# Patient Record
Sex: Male | Born: 1960 | Hispanic: Yes | Marital: Single | Smoking: Never smoker
Health system: Southern US, Community
[De-identification: ages and names within clinical notes are randomized; demographics above are authoritative.]

## PROBLEM LIST (undated history)

## (undated) DIAGNOSIS — G8929 Other chronic pain: Secondary | ICD-10-CM

## (undated) DIAGNOSIS — M549 Dorsalgia, unspecified: Secondary | ICD-10-CM

## (undated) DIAGNOSIS — E119 Type 2 diabetes mellitus without complications: Secondary | ICD-10-CM

## (undated) DIAGNOSIS — I10 Essential (primary) hypertension: Secondary | ICD-10-CM

## (undated) DIAGNOSIS — M5416 Radiculopathy, lumbar region: Secondary | ICD-10-CM

## (undated) HISTORY — PX: KNEE SURGERY: SHX244

---

## 2004-07-29 ENCOUNTER — Emergency Department: Payer: Self-pay | Admitting: Emergency Medicine

## 2004-10-08 ENCOUNTER — Emergency Department: Payer: Self-pay | Admitting: Emergency Medicine

## 2004-10-10 ENCOUNTER — Emergency Department: Payer: Self-pay | Admitting: Emergency Medicine

## 2006-10-04 ENCOUNTER — Emergency Department: Payer: Self-pay | Admitting: Emergency Medicine

## 2006-10-04 ENCOUNTER — Other Ambulatory Visit: Payer: Self-pay

## 2007-08-14 ENCOUNTER — Emergency Department: Payer: Self-pay | Admitting: Emergency Medicine

## 2008-04-01 ENCOUNTER — Emergency Department: Payer: Self-pay | Admitting: Internal Medicine

## 2008-04-13 ENCOUNTER — Emergency Department (HOSPITAL_COMMUNITY): Admission: EM | Admit: 2008-04-13 | Discharge: 2008-04-13 | Payer: Self-pay | Admitting: Emergency Medicine

## 2008-04-20 ENCOUNTER — Ambulatory Visit (HOSPITAL_COMMUNITY): Admission: RE | Admit: 2008-04-20 | Discharge: 2008-04-20 | Payer: Self-pay | Admitting: Orthopaedic Surgery

## 2008-08-02 ENCOUNTER — Ambulatory Visit (HOSPITAL_COMMUNITY): Admission: RE | Admit: 2008-08-02 | Discharge: 2008-08-02 | Payer: Self-pay | Admitting: Orthopaedic Surgery

## 2009-06-02 ENCOUNTER — Observation Stay: Payer: Self-pay | Admitting: Internal Medicine

## 2010-08-23 LAB — COMPREHENSIVE METABOLIC PANEL
AST: 44 U/L — ABNORMAL HIGH (ref 0–37)
Albumin: 3.9 g/dL (ref 3.5–5.2)
BUN: 14 mg/dL (ref 6–23)
Chloride: 101 mEq/L (ref 96–112)
Creatinine, Ser: 0.62 mg/dL (ref 0.4–1.5)
GFR calc Af Amer: >60 mL/min (ref >60)
Potassium: 4 mEq/L (ref 3.5–5.1)
Total Bilirubin: 0.9 mg/dL (ref 0.3–1.2)
Total Protein: 6.6 g/dL (ref 6.0–8.3)

## 2010-08-23 LAB — CBC
HCT: 42.8 % (ref 39.0–52.0)
MCV: 94.4 fL (ref 78.0–100.0)
Platelets: 173 10*3/uL (ref 150–400)
RDW: 13.1 % (ref 11.5–15.5)
WBC: 8.4 10*3/uL (ref 4.0–10.5)

## 2010-08-23 LAB — URINALYSIS, ROUTINE W REFLEX MICROSCOPIC
Glucose, UA: NEGATIVE mg/dL
Hgb urine dipstick: NEGATIVE
Specific Gravity, Urine: >1.03 — ABNORMAL HIGH (ref 1.005–1.030)
pH: 5.5 (ref 5.0–8.0)

## 2010-08-23 LAB — DIFFERENTIAL
Eosinophils Relative: 5 % (ref 0–5)
Lymphocytes Relative: 38 % (ref 12–46)
Monocytes Absolute: 0.6 10*3/uL (ref 0.1–1.0)
Monocytes Relative: 8 % (ref 3–12)
Neutro Abs: 4.1 10*3/uL (ref 1.7–7.7)

## 2010-09-25 NOTE — H&P (Signed)
Joseph Raymond Joseph Raymond Raymond, Joseph Raymond Joseph Raymond Raymond         ACCOUNT NO.:  [id number]   MEDICAL RECORD NO.:  [id number]          PATIENT TYPE:  AMB   LOCATION:  DAY                           FACILITY:  [facility]   PHYSICIAN:  J. Darreld Mclean, M.D. DATE OF BIRTH:  [birth year]   DATE OF ADMISSION:  DATE OF DISCHARGE:  LH                              HISTORY & PHYSICAL   The patient is scheduled for surgery on Tuesday.   CHIEF COMPLAINT:  My left knee hurts.   I first saw the patient who is 50 years old on April 14, 2008, in the  office.  He had a recent injury of his arm on April 01, 2008.  He was  seen at [facility].  X-rays taken were negative.  He was given ibuprofen.  He continued pain, giving away of his knee.  He  did not improve.  He is taking Naprosyn and oxycodone.  His knee was not  getting any better.  I was concerned about a possible meniscal tear.  I  injected his knee with Marcaine, Xylocaine, and 1 mL a Depo-Medrol.  I  kept him on his Naprosyn.  I scheduled for MRI of his left knee.  MRI of  the left knee was done on April 20, 2008.  It showed a tear of the  posterior horn of the medial meniscus with centrally and peripherally  placed meniscal fragment.  He had some medial and collateral ligament  changes and changes of chondromalacia and patella.  I informed him of  the findings on April 22, 2008, but his knee was doing better and he  did not want to have any surgery.  He wanted to wait until up to  holidays.  I saw him back in June 13, 2008, and he had a new large  effusion with range of motion decreased giving away.  I aspirated 35 mL  of blood from his knee.  I told him about outpatient surgery.  He said  he wanted to think about it.  He returned the office on July 29, 2008,  and stated he would like to have surgery this coming week.   I discussed with him the risks and imponderables of the procedure.  He  appears to understand.  He asked appropriate  questions.  I told him it  is an elective procedure and he did not have to have the surgery.  He  can wait.  He says he waited long enough.  He understands with meniscus  being removed over time, he can have some degenerative changes and may  need further surgery.   PAST HISTORY:  Negative.  Denies heart disease.  He denies lung disease,  kidney disease, stroke, paralysis, weakness, hypertension, diabetes,  cancer, ulcer disease, or circulatory problems.   He does not smoke, but he does use alcoholic beverages socially.   No allergies.   He is currently taking Naprosyn 500 mg p.o. b.i.d. and Vicodin ES q.4  p.r.n. pain.   He has had no previous surgery.   He has no family doctor.   He lives in [place] in [place] and he  is single.   PHYSICAL EXAMINATION:  VITAL SIGNS:  BP is 150/99, respirations 16, he  is afebrile, 5 feet 4-1/2 inches tall and weighs 249 pounds.  GENERAL:  He is alert, cooperative, and oriented.  HEENT:  Negative.  NECK:  Supple.  LUNGS:  Clear to P&A.  HEART:  Regular rate and rhythm without murmur heard.  ABDOMEN:  Soft, nontender without masses.  EXTREMITIES:  He has got some slight effusion in the left knee.  Range  of motion is 0 to 110 degrees.  Tender medially with a positive Murray  medially.  He has no distal edema.  Other extremities are negative.  CNS:  Intact.  SKIN:  Intact.   IMPRESSION:  Tear of the medial meniscus of the left knee.   PLAN:  Arthroscopy of the left knee as an outpatient procedure.  Labs  are pending.  Risks and imponderables have been discussed with the  patient.                                            ______________________________  J. Darreld Mclean, M.D.     JWK/MEDQ  D:  07/31/2008  T:  08/01/2008  Job:  [id number]

## 2010-09-25 NOTE — Op Note (Signed)
NAMEMADSEN, RIDDLE         ACCOUNT NO.:  [id number]   MEDICAL RECORD NO.:  [id number]          PATIENT TYPE:  AMB   LOCATION:  DAY                           FACILITY:  [facility]   PHYSICIAN:  J. Darreld Mclean, M.D. DATE OF BIRTH:  01-Apr-1961   DATE OF PROCEDURE:  08/02/2008  DATE OF DISCHARGE:                               OPERATIVE REPORT   PREOPERATIVE DIAGNOSIS:  Tear in the left knee medial meniscus.   POSTOPERATIVE DIAGNOSIS:  Tear in the left knee medial meniscus.   PROCEDURES:  1. Arthroscopy of the left knee.  2. Partial medial meniscectomy.   ANESTHESIA:  General.   TOURNIQUET TIME:  18 minutes.   DRAINS:  No drains.   SURGEON:  J. Darreld Mclean, MD   INDICATIONS:  The patient is a 50 year old male who has had pain and  tenderness in his left knee.  MRI shows a tear of the posterior horn of  the medial meniscus.  The patient originally delayed surgery because his  knee got better, but has gotten worse with recurring effusions, pain  giving way.  One over the risks and imponderables of an outpatient  procedure for the arthroscopy of the knee, he appeared to understand and  agrees to have it done.   Physical therapy has been arranged postoperatively.  The patient was  seen in the holding area.  The left knee was identified as the correct  surgical site.  He placed a mark on the left knee.  I placed a mark on  the left knee.  He was brought back to the operating room.  He was  placed supine on the operating room table and given general anesthesia.  Leg holder and tourniquet were placed, deflated in the left upper thigh.  He was prepped and draped in the usual manner.   A generalized time-out identifying the patient as Mr. Mccadden.  The  left knee as the correct surgical site.  All instrumentations were  seemed to be properly positioned and working.  Operative team knew each  other.  The leg was then elevated, wrapped circumferentially with  Esmarch bandage.   Tourniquet inflated to 300 mmHg.  Esmarch bandage was  removed.  Inflow cannula was inserted medially.  Lactated Ringers  instilled to the knee by an infusion pump.  Arthroscope inserted  laterally and the knee was systematically examined.   Suprapatellar pouch had mild synovitis.  Insertion patella some early  changes more medially, grade II.  The medial side of the knee,  there  was a tear in the posterior horn of the medial meniscus with some grade  II to grade III changes in the femoral condyle and the tibial plateau.  This was at the location of the tear.  Anterior cruciate was intact  laterally.  Then, meniscus looked very good with only minimal changes  and grade II changes.  No loose bodies were present in the knee.   Attention was directed back to the medial side and using meniscal punch  and meniscal shaver, good smooth contour was obtained after moving the  tear.  Specimens sent to Pathology.  Knee was  systematically reexamined  and no pathology found.  Prominent pictures were taken throughout the  case.  Knee was irrigated with remaining part of lactated Ringers.  A 3-  0 nylon interrupted vertical mattress manner was used.  Each portal was  closed.  Marcaine 0.25% was used in each portal site.  Tourniquet  deflated after 18 minutes.  Sterile dressing applied.  Bulky dressing  applied.  The patient tolerated the procedure well and go to Recovery in  good condition.  Tylox given for pain.  He is scheduled physical therapy  later in the week.  If any difficulties, contact me through the office  hospital beeper system.  Numbers have been provided.           ______________________________  Shela Commons. Darreld Mclean, M.D.     JWK/MEDQ  D:  08/02/2008  T:  08/02/2008  Job:  [id number]

## 2013-04-17 ENCOUNTER — Emergency Department: Payer: Self-pay | Admitting: Emergency Medicine

## 2013-06-09 ENCOUNTER — Ambulatory Visit: Payer: Self-pay | Admitting: Otolaryngology

## 2014-03-09 ENCOUNTER — Emergency Department (HOSPITAL_COMMUNITY)
Admission: EM | Admit: 2014-03-09 | Discharge: 2014-03-09 | Disposition: A | Discharge status: Home / Self Care | Payer: Self-pay | Attending: Emergency Medicine | Admitting: Emergency Medicine

## 2014-03-09 ENCOUNTER — Encounter (HOSPITAL_COMMUNITY): Payer: Self-pay | Admitting: Emergency Medicine

## 2014-03-09 DIAGNOSIS — H9192 Unspecified hearing loss, left ear: Secondary | ICD-10-CM | POA: Insufficient documentation

## 2014-03-09 DIAGNOSIS — G8929 Other chronic pain: Secondary | ICD-10-CM | POA: Insufficient documentation

## 2014-03-09 DIAGNOSIS — M5416 Radiculopathy, lumbar region: Secondary | ICD-10-CM | POA: Insufficient documentation

## 2014-03-09 DIAGNOSIS — E119 Type 2 diabetes mellitus without complications: Secondary | ICD-10-CM | POA: Insufficient documentation

## 2014-03-09 HISTORY — DX: Type 2 diabetes mellitus without complications: E11.9

## 2014-03-09 LAB — CBG MONITORING, ED: GLUCOSE-CAPILLARY: 120 mg/dL — AB (ref 70–99)

## 2014-03-09 MED ORDER — HYDROCODONE-ACETAMINOPHEN 5-325 MG PO TABS: 1.0000 | ORAL_TABLET | Freq: Four times a day (QID) | ORAL | Status: DC | PRN

## 2014-03-09 NOTE — ED Notes (Signed)
Pt. Reports that he has chronic back pain for years. Denies any new onset of symptoms. States that he had time to be seen today.

## 2014-03-09 NOTE — Discharge Instructions (Signed)
Sciatica Take Tylenol for mild pain or the pain medicine prescribed for bad pain. [facility] if you wish to investigate your back pain or hearing loss from your left ear further. Sciatica is pain, weakness, numbness, or tingling along your sciatic nerve. The nerve starts in the lower back and runs down the back of each leg. Nerve damage or certain conditions pinch or put pressure on the sciatic nerve. This causes the pain, weakness, and other discomforts of sciatica. HOME CARE   Only take medicine as told by your doctor.  Apply ice to the affected area for 20 minutes. Do this 3-4 times a day for the first 48-72 hours. Then try heat in the same way.  Exercise, stretch, or do your usual activities if these do not make your pain worse.  Go to physical therapy as told by your doctor.  Keep all doctor visits as told.  Do not wear high heels or shoes that are not supportive.  Get a firm mattress if your mattress is too soft to lessen pain and discomfort. GET HELP RIGHT AWAY IF:   You cannot control when you poop (bowel movement) or pee (urinate).  You have more weakness in your lower back, lower belly (pelvis), butt (buttocks), or legs.  You have redness or puffiness (swelling) of your back.  You have a burning feeling when you pee.  You have pain that gets worse when you lie down.  You have pain that wakes you from your sleep.  Your pain is worse than past pain.  Your pain lasts longer than 4 weeks.  You are suddenly losing weight without reason. MAKE SURE YOU:   Understand these instructions.  Will watch this condition.  Will get help right away if you are not doing well or get worse. Document Released: 02/06/2008 Document Revised: 10/29/2011 Document Reviewed: 09/08/2011 [facility] Patient Information 2015 [place]. This information is not intended to replace advice given to you by your health care provider. Make sure you  discuss any questions you have with your health care provider.

## 2014-03-09 NOTE — ED Provider Notes (Signed)
CSN: [id number]     Arrival date & time 03/09/14  [id number] History   First MD Initiated Contact with Patient 03/09/14 0735     Chief Complaint  Patient presents with  . Back Pain   History obtained via medical interpreter using [place] language line. patient speaks little [place]  (Consider location/radiation/quality/duration/timing/severity/associated sxs/prior Treatment) HPI Complains of low back pain right sided paralumbar radiating to right knee onset one year ago, progressively worsening. Presents today because "I haven't had time to go to the doctor." No treatment prior to coming here pain is worse with bending or lifting improved with remaining still. No incontinence no fever no numbness no weakness no other associated symptoms. Pain mild at present Past Medical History  Diagnosis Date  . Diabetes mellitus without complication    Past Surgical History  Procedure Laterality Date  . Knee surgery     No family history on file. History  Substance Use Topics  . Smoking status: Never Smoker   . Smokeless tobacco: Not on file  . Alcohol Use: 1.2 oz/week    2 Cans of beer per week    no drug use Review of Systems  Constitutional: Negative.   HENT: Positive for hearing loss.        Hearing loss from left ear times one year, unchanged  Respiratory: Negative.   Cardiovascular: Negative.   Gastrointestinal: Negative.   Musculoskeletal: Positive for back pain.  Skin: Negative.   Neurological: Negative.   Psychiatric/Behavioral: Negative.   All other systems reviewed and are negative.     Allergies  Review of patient's allergies indicates not on file.  Home Medications   Prior to Admission medications   Not on File   BP 158/75  Pulse 72  Temp(Src) 98.6 F (37 C) (Oral)  Resp 16  SpO2 98% Physical Exam  Nursing note and vitals reviewed. Constitutional: He appears well-developed and well-nourished.  HENT:  Head: Normocephalic and atraumatic.  Eyes: Conjunctivae are  normal. Pupils are equal, round, and reactive to light.  Neck: Neck supple. No tracheal deviation present. No thyromegaly present.  Cardiovascular: Normal rate and regular rhythm.   No murmur heard. Pulmonary/Chest: Effort normal and breath sounds normal.  Abdominal: Soft. Bowel sounds are normal. He exhibits no distension. There is no tenderness.  Musculoskeletal: Normal range of motion. He exhibits no edema and no tenderness.  Entire spine nontender  Neurological: He is alert. He has normal reflexes. No cranial nerve deficit. Coordination normal.  DTRs 2+ symmetric bilaterally at knee jerk and ankle jerk and biceps toes downgoing bilaterally gait normal motor strength 5 over 5 overall  Skin: Skin is warm and dry. No rash noted.  Psychiatric: He has a normal mood and affect.    ED Course  Procedures (including critical care time) Labs Review Labs Reviewed - No data to display  Imaging Review No results found.   EKG Interpretation None     Declines pain medicine in the ED  Results for orders placed during the hospital encounter of 03/09/14  CBG MONITORING, ED      Result Value Ref Range   Glucose-Capillary 120 (*) 70 - 99 mg/dL   No results found.   MDM  No red flags for back pain clinical suspicion for infection low emergent imaging not indicated Final diagnoses:  None   plan prescription Norco. NSAIDS relatively contraindicated in light of history of diabetes He can follow-up with his [facility] if pain not well controlled  or if he wishes further investigation regarding chronic hearing loss and chronic back pain Diagnoses #1 lumbar radiculopathy     Doug SouSam Olusegun Gerstenberger, MD 03/09/14 [phone]

## 2015-03-16 ENCOUNTER — Emergency Department (HOSPITAL_COMMUNITY)
Admission: EM | Admit: 2015-03-16 | Discharge: 2015-03-16 | Disposition: A | Discharge status: Home / Self Care | Payer: BLUE CROSS/BLUE SHIELD | Attending: Emergency Medicine | Admitting: Emergency Medicine

## 2015-03-16 ENCOUNTER — Encounter (HOSPITAL_COMMUNITY): Payer: Self-pay

## 2015-03-16 DIAGNOSIS — E119 Type 2 diabetes mellitus without complications: Secondary | ICD-10-CM | POA: Insufficient documentation

## 2015-03-16 DIAGNOSIS — M5416 Radiculopathy, lumbar region: Secondary | ICD-10-CM | POA: Diagnosis not present

## 2015-03-16 DIAGNOSIS — G8929 Other chronic pain: Secondary | ICD-10-CM | POA: Diagnosis not present

## 2015-03-16 DIAGNOSIS — M62830 Muscle spasm of back: Secondary | ICD-10-CM | POA: Diagnosis not present

## 2015-03-16 DIAGNOSIS — M545 Low back pain: Secondary | ICD-10-CM | POA: Diagnosis present

## 2015-03-16 HISTORY — DX: Other chronic pain: G89.29

## 2015-03-16 HISTORY — DX: Radiculopathy, lumbar region: M54.16

## 2015-03-16 HISTORY — DX: Dorsalgia, unspecified: M54.9

## 2015-03-16 MED ORDER — TRAMADOL HCL 50 MG PO TABS: 50.0000 mg | ORAL_TABLET | Freq: Four times a day (QID) | ORAL | Status: DC | PRN

## 2015-03-16 MED ORDER — NAPROXEN 250 MG PO TABS
500.0000 mg | ORAL_TABLET | Freq: Once | ORAL | Status: AC
  Administered 2015-03-16: 500 mg via ORAL
  Filled 2015-03-16: qty 2

## 2015-03-16 MED ORDER — DIAZEPAM 5 MG PO TABS
10.0000 mg | ORAL_TABLET | Freq: Once | ORAL | Status: AC
  Administered 2015-03-16: 10 mg via ORAL
  Filled 2015-03-16: qty 2

## 2015-03-16 MED ORDER — ACETAMINOPHEN 325 MG PO TABS
650.0000 mg | ORAL_TABLET | Freq: Once | ORAL | Status: AC
  Administered 2015-03-16: 650 mg via ORAL
  Filled 2015-03-16: qty 2

## 2015-03-16 MED ORDER — METHOCARBAMOL 500 MG PO TABS: 500.0000 mg | ORAL_TABLET | Freq: Three times a day (TID) | ORAL | Status: DC

## 2015-03-16 NOTE — ED Notes (Signed)
Pt c/o pain in r lower back radiating down r leg for "years."  Reports has seen Dr. Hilda LiasKeeling in the past but says nothing is helping.

## 2015-03-16 NOTE — ED Notes (Signed)
Family at bedside. 

## 2015-03-16 NOTE — Discharge Instructions (Signed)
Heating pad to your lower back maybe helpful. Please use Robaxin and Ultram for ear pain and discomfort. These medications may cause drowsiness, please do not drink alcohol, operate machinery, drive, or to spit in activities requiring concentration taking these medications. Please see Dr. Hilda LiasKeeling to continue the workup of your back pain. Muscle Cramps and Spasms Muscle cramps and spasms are when muscles tighten by themselves. They usually get better within minutes. Muscle cramps are painful. They are usually stronger and last longer than muscle spasms. Muscle spasms may or may not be painful. They can last a few seconds or much longer. HOME CARE  Drink enough fluid to keep your pee (urine) clear or pale yellow.  Massage, stretch, and relax the muscle.  Use a warm towel, heating pad, or warm shower water on tight muscles.  Place ice on the muscle if it is tender or in pain.  Put ice in a plastic bag.  Place a towel between your skin and the bag.  Leave the ice on for 15-20 minutes, 03-04 times a day.  Only take medicine as told by your doctor. GET HELP RIGHT AWAY IF:  Your cramps or spasms get worse, happen more often, or do not get better with time. MAKE SURE YOU:  Understand these instructions.  Will watch your condition.  Will get help right away if you are not doing well or get worse.   This information is not intended to replace advice given to you by your health care provider. Make sure you discuss any questions you have with your health care provider.   Document Released: 04/11/2008 Document Revised: 08/24/2012 Document Reviewed: 04/15/2012 Elsevier Interactive Patient Education 2016 Else[employer] Inc.  Lumbosacral Radiculopathy Lumbosacral radiculopathy is a condition that involves the spinal nerves and nerve roots in the low back and bottom of the spine. The condition develops when these nerves and nerve roots move out of place or become inflamed and cause  symptoms. CAUSES This condition may be caused by:  Pressure from a disk that bulges out of place (herniated disk). A disk is a plate of cartilage that separates bones in the spine.  Disk degeneration.  A narrowing of the bones of the lower back (spinal stenosis).  A tumor.  An infection.  An injury that places sudden pressure on the disks that cushion the bones of your lower spine. RISK FACTORS This condition is more likely to develop in:  Males aged 30-50 years.  Females aged 50-60 years.  People who lift improperly.  People who are overweight or live a sedentary lifestyle.  People who smoke.  People who perform repetitive activities that strain the spine. SYMPTOMS Symptoms of this condition include:  Pain that goes down from the back into the legs (sciatica). This is the most common symptom. The pain may be worse with sitting, coughing, or sneezing.  Pain and numbness in the arms and legs.  Muscle weakness.  Tingling.  Loss of bladder control or bowel control. DIAGNOSIS This condition is diagnosed with a physical exam and medical history. If the pain is lasting, you may have tests, such as:  MRI scan.  X-ray.  CT scan.  Myelogram.  Nerve conduction study. TREATMENT This condition is often treated with:  Hot packs and ice applied to affected areas.  Stretches to improve flexibility.  Exercises to strengthen back muscles.  Physical therapy.  Pain medicine.  A steroid injection in the spine. In some cases, no treatment is needed. If the condition is long-lasting (chronic),  or if symptoms are severe, treatment may involve surgery or lifestyle changes, such as following a weight loss plan. HOME CARE INSTRUCTIONS Medicines  Take medicines only as directed by your health care provider.  Do not drive or operate heavy machinery while taking pain medicine. Injury Care  Apply a heat pack to the injured area as directed by your health care  provider.  Apply ice to the affected area:  Put ice in a plastic bag.  Place a towel between your skin and the bag.  Leave the ice on for 20-30 minutes, every 2 hours while you are awake or as needed. Or, leave the ice on for as long as directed by your health care provider. Other Instructions  If you were shown how to do any exercises or stretches, do them as directed by your health care provider.  If your health care provider prescribed a diet or exercise program, follow it as directed.  Keep all follow-up visits as directed by your health care provider. This is important. SEEK MEDICAL CARE IF:  Your pain does not improve over time even when taking pain medicines. SEEK IMMEDIATE MEDICAL CARE IF:  Your develop severe pain.  Your pain suddenly gets worse.  You develop increasing weakness in your legs.  You lose the ability to control your bladder or bowel.  You have difficulty walking or balancing.  You have a fever.   This information is not intended to replace advice given to you by your health care provider. Make sure you discuss any questions you have with your health care provider.   Document Released: 04/29/2005 Document Revised: 09/13/2014 Document Reviewed: 04/25/2014 Elsevier Interactive Patient Education [employer]

## 2015-03-16 NOTE — ED Provider Notes (Signed)
CSN: [id number]     Arrival date & time 03/16/15  0844 History   First MD Initiated Contact with Patient 03/16/15 0902     Chief Complaint  Patient presents with  . Back Pain     (Consider location/radiation/quality/duration/timing/severity/associated sxs/prior Treatment) Patient is a 54 y.o. male presenting with back pain. The history is provided by the patient. The history is limited by a language barrier. A language interpreter was used.  Back Pain Location:  Lumbar spine Quality:  Aching and shooting Radiates to:  R knee Pain severity:  Moderate Onset quality:  Gradual Duration:  12 months Timing:  Intermittent Progression:  Worsening Chronicity:  Chronic Relieved by:  Nothing Worsened by:  Movement Associated symptoms: paresthesias   Associated symptoms: no bladder incontinence, no bowel incontinence, no perianal numbness and no weakness   Risk factors: no recent surgery     Past Medical History  Diagnosis Date  . Diabetes mellitus without complication (HCC)   . Chronic back pain   . Lumbar radiculopathy    Past Surgical History  Procedure Laterality Date  . Knee surgery     No family history on file. Social History  Substance Use Topics  . Smoking status: Never Smoker   . Smokeless tobacco: None  . Alcohol Use: 1.2 oz/week    2 Cans of beer per week     Comment: occ    Review of Systems  Gastrointestinal: Negative for bowel incontinence.  Genitourinary: Negative for bladder incontinence.  Musculoskeletal: Positive for back pain.  Neurological: Positive for paresthesias. Negative for weakness.  All other systems reviewed and are negative.     Allergies  Review of patient's allergies indicates no known allergies.  Home Medications   Prior to Admission medications   Medication Sig Start Date End Date Taking? Authorizing Provider  HYDROcodone-acetaminophen (NORCO) 5-325 MG per tablet Take 1-2 tablets by mouth every 6 (six) hours as needed for severe  pain. 03/09/14   Doug Sou, MD   BP 140/76 mmHg  Pulse 58  Temp(Src) 98.3 F (36.8 C) (Oral)  Ht  (1.626 m)  Wt 230 lb (104.327 kg)  BMI 39.46 kg/m2  SpO2 98% Physical Exam  Constitutional: He is oriented to person, place, and time. He appears well-developed and well-nourished.  Non-toxic appearance.  HENT:  Head: Normocephalic.  Right Ear: Tympanic membrane and external ear normal.  Left Ear: Tympanic membrane and external ear normal.  Eyes: EOM and lids are normal. Pupils are equal, round, and reactive to light.  Neck: Normal range of motion. Neck supple. Carotid bruit is not present.  Cardiovascular: Normal rate, regular rhythm, normal heart sounds, intact distal pulses and normal pulses.   Pulmonary/Chest: Breath sounds normal. No respiratory distress.  Abdominal: Soft. Bowel sounds are normal. There is no tenderness. There is no guarding.  Musculoskeletal: Normal range of motion.       Lumbar back: He exhibits pain and spasm. He exhibits no deformity.  No palpable step off of the cervical, thoracic, or lumbar spine.  Lymphadenopathy:       Head (right side): No submandibular adenopathy present.       Head (left side): No submandibular adenopathy present.    He has no cervical adenopathy.  Neurological: He is alert and oriented to person, place, and time. He has normal strength. No cranial nerve deficit or sensory deficit. Coordination and gait normal.  Gait steady. No foot drop. Decrease in sensation of touch and pain on the right lower ext.Marland Kitchen  This is not new according to the patient. Motor strength symmetrical.  Skin: Skin is warm and dry.  Psychiatric: He has a normal mood and affect. His speech is normal.  Nursing note and vitals reviewed.   ED Course  Procedures (including critical care time) Labs Review Labs Reviewed - No data to display  Imaging Review No results found. I have personally reviewed and evaluated these images and lab results as part of my  medical decision-making.   EKG Interpretation None      MDM  Patient states that he has been evaluated for this back pain by Dr. Hilda LiasKeeling. He states he had an x-ray in the office, and the bones were okay. The patient was encouraged to have an MRI done, but has not had that done yet. His been no gross neurologic deficits reported. There has been increase in pain, particularly while the patient is at work. No new deficits appreciated on today's examination. The patient has pain and spasm in the lumbar paraspinal area. Suspect the patient has a lumbar strain, and lumbar radiculopathy. Patient will be treated robaxin and ultram. He will see Dr Hilda LiasKeeling in the office for follow up and recheck.   Final diagnoses:  None    *I have reviewed nursing notes, vital signs, and all appropriate lab and imaging results for this patient.    Ivery QualeHobson Marget Outten, PA-C 03/16/15 1000  Samuel JesterKathleen McManus, DO 03/19/15 2053

## 2016-10-29 ENCOUNTER — Emergency Department (HOSPITAL_COMMUNITY): Payer: BLUE CROSS/BLUE SHIELD

## 2016-10-29 ENCOUNTER — Encounter (HOSPITAL_COMMUNITY): Payer: Self-pay | Admitting: Emergency Medicine

## 2016-10-29 ENCOUNTER — Emergency Department (HOSPITAL_COMMUNITY)
Admission: EM | Admit: 2016-10-29 | Discharge: 2016-10-29 | Disposition: A | Discharge status: Home / Self Care | Payer: BLUE CROSS/BLUE SHIELD | Attending: Emergency Medicine | Admitting: Emergency Medicine

## 2016-10-29 DIAGNOSIS — E1165 Type 2 diabetes mellitus with hyperglycemia: Secondary | ICD-10-CM | POA: Insufficient documentation

## 2016-10-29 DIAGNOSIS — M545 Low back pain, unspecified: Secondary | ICD-10-CM

## 2016-10-29 DIAGNOSIS — R739 Hyperglycemia, unspecified: Secondary | ICD-10-CM

## 2016-10-29 LAB — I-STAT CHEM 8, ED
BUN: 16 mg/dL (ref 6–20)
Calcium, Ion: 1.16 mmol/L (ref 1.15–1.40)
Chloride: 98 mmol/L — ABNORMAL LOW (ref 101–111)
Creatinine, Ser: 0.5 mg/dL — ABNORMAL LOW (ref 0.61–1.24)
Glucose, Bld: 441 mg/dL — ABNORMAL HIGH (ref 65–99)
HEMATOCRIT: 42 % (ref 39.0–52.0)
HEMOGLOBIN: 14.3 g/dL (ref 13.0–17.0)
Potassium: 4.2 mmol/L (ref 3.5–5.1)
SODIUM: 136 mmol/L (ref 135–145)
TCO2: 24 mmol/L (ref 0–100)

## 2016-10-29 LAB — CBG MONITORING, ED
GLUCOSE-CAPILLARY: 357 mg/dL — AB (ref 65–99)
GLUCOSE-CAPILLARY: 264 mg/dL — AB (ref 65–99)

## 2016-10-29 LAB — URINALYSIS, MICROSCOPIC (REFLEX): RBC / HPF: NONE SEEN RBC/hpf (ref 0–5)

## 2016-10-29 LAB — URINALYSIS, ROUTINE W REFLEX MICROSCOPIC
Bilirubin Urine: NEGATIVE
Hgb urine dipstick: NEGATIVE
Ketones, ur: 40 mg/dL — AB
LEUKOCYTES UA: NEGATIVE
NITRITE: NEGATIVE
PH: 6 (ref 5.0–8.0)
Protein, ur: NEGATIVE mg/dL
SPECIFIC GRAVITY, URINE: 1.015 (ref 1.005–1.030)

## 2016-10-29 MED ORDER — METFORMIN HCL 500 MG PO TABS: 500.0000 mg | ORAL_TABLET | Freq: Two times a day (BID) | ORAL | 0 refills | Status: DC

## 2016-10-29 MED ORDER — SODIUM CHLORIDE 0.9 % IV BOLUS (SEPSIS)
1000.0000 mL | Freq: Once | INTRAVENOUS | Status: AC
  Administered 2016-10-29: 1000 mL via INTRAVENOUS

## 2016-10-29 MED ORDER — METHOCARBAMOL 500 MG PO TABS: 500.0000 mg | ORAL_TABLET | Freq: Three times a day (TID) | ORAL | 0 refills | Status: DC | PRN

## 2016-10-29 MED ORDER — INSULIN ASPART 100 UNIT/ML ~~LOC~~ SOLN
4.0000 [IU] | Freq: Once | SUBCUTANEOUS | Status: AC
  Administered 2016-10-29: 4 [IU] via INTRAVENOUS
  Filled 2016-10-29: qty 1

## 2016-10-29 NOTE — Discharge Instructions (Signed)
Follow up with primary care doctor.

## 2016-10-29 NOTE — ED Provider Notes (Signed)
[facility] DEPT Provider Note   CSN: [id number] Arrival date & time: 10/29/16  0737     History   Chief Complaint Chief Complaint  Patient presents with  . Flank Pain    HPI Joseph Raymond is a 56 y.o. male.  HPI Patient resents with right-sided lower back pain. Sciatica last 2 weeks. Worse with movements and walking. No numbness or weakness. No urinary symptoms. No bowel pain. No fevers. Has a history of low back pain states this feels different. Does not radiate down the legs. No loss of bladder bowel control. No trauma. Past Medical History:  Diagnosis Date  . Chronic back pain   . Diabetes mellitus without complication (HCC)   . Lumbar radiculopathy     There are no active problems to display for this patient.   Past Surgical History:  Procedure Laterality Date  . KNEE SURGERY         Home Medications    Prior to Admission medications   Medication Sig Start Date End Date Taking? Authorizing Provider  HYDROcodone-acetaminophen (NORCO) 5-325 MG per tablet Take 1-2 tablets by mouth every 6 (six) hours as needed for severe pain. Patient not taking: Reported on 03/16/2015 03/09/14   Doug Sou, MD  metFORMIN (GLUCOPHAGE) 500 MG tablet Take 1 tablet (500 mg total) by mouth 2 (two) times daily with a meal. 10/29/16   Benjiman Core, MD  methocarbamol (ROBAXIN) 500 MG tablet Take 1 tablet (500 mg total) by mouth every 8 (eight) hours as needed for muscle spasms. 10/29/16   Benjiman Core, MD  traMADol (ULTRAM) 50 MG tablet Take 1 tablet (50 mg total) by mouth every 6 (six) hours as needed. 03/16/15   Ivery Quale, PA-C    Family History History reviewed. No pertinent family history.  Social History Social History  Substance Use Topics  . Smoking status: Never Smoker  . Smokeless tobacco: Never Used  . Alcohol use 1.2 oz/week    2 Cans of beer per week     Comment: occ     Allergies   Patient has no known allergies.   Review of  Systems Review of Systems  Constitutional: Negative for appetite change and fever.  HENT: Negative for congestion.   Respiratory: Negative for shortness of breath.   Cardiovascular: Negative for chest pain.  Gastrointestinal: Negative for abdominal pain.  Musculoskeletal: Positive for back pain. Negative for joint swelling and myalgias.  Neurological: Negative for weakness and numbness.  Psychiatric/Behavioral: Negative for confusion.     Physical Exam Updated Vital Signs BP (!) 127/50 (BP Location: Left Arm)   Pulse 75   Temp 97.8 F (36.6 C) (Oral)   Resp 18   Ht 5\' 4"  (1.626 m)   Wt 104.3 kg (230 lb)   SpO2 100%   BMI 39.48 kg/m   Physical Exam  Constitutional: He appears well-developed.  HENT:  Head: Atraumatic.  Eyes: Pupils are equal, round, and reactive to light.  Cardiovascular: Normal rate.   Pulmonary/Chest: Effort normal.  Abdominal: Soft. He exhibits no mass. There is no tenderness.  Musculoskeletal: He exhibits tenderness.  Mild tenderness in right low back to SI area. Good straight leg raise bilaterally. Some pain with gait. Strength intact in lower extremities. Sensation intact in bilateral lower extremity's.  Neurological: He is alert.  Skin: Skin is warm. Capillary refill takes less than 2 seconds.  Psychiatric: He has a normal mood and affect.     ED Treatments / Results  Labs (all labs ordered are  listed, but only abnormal results are displayed) Labs Reviewed  URINALYSIS, ROUTINE W REFLEX MICROSCOPIC - Abnormal; Notable for the following:       Result Value   Glucose, UA >=500 (*)    Ketones, ur 40 (*)    All other components within normal limits  URINALYSIS, MICROSCOPIC (REFLEX) - Abnormal; Notable for the following:    Bacteria, UA RARE (*)    Squamous Epithelial / LPF 0-5 (*)    All other components within normal limits  I-STAT CHEM 8, ED - Abnormal; Notable for the following:    Chloride 98 (*)    Creatinine, Ser 0.50 (*)    Glucose,  Bld 441 (*)    All other components within normal limits  CBG MONITORING, ED - Abnormal; Notable for the following:    Glucose-Capillary 357 (*)    All other components within normal limits  CBG MONITORING, ED - Abnormal; Notable for the following:    Glucose-Capillary 264 (*)    All other components within normal limits    EKG  EKG Interpretation None       Radiology Dg Lumbar Spine Complete  Result Date: 10/29/2016 CLINICAL DATA:  Right lower back pain, flank pain for 2 weeks EXAM: LUMBAR SPINE - COMPLETE 4+ VIEW COMPARISON:  None. FINDINGS: Degenerative facet disease throughout the lumbar spine. Early degenerative spurring anteriorly from L2-3 through L5-S1. Moderate compression fracture through the superior and 8 of T12. No malalignment. IMPRESSION: Moderate T12 compression fracture. Degenerative facet disease throughout the lumbar spine. Electronically Signed   By: Charlett NoseKevin  Dover M.D.   On: 10/29/2016 08:38    Procedures Procedures (including critical care time)  Medications Ordered in ED Medications  sodium chloride 0.9 % bolus 1,000 mL (0 mLs Intravenous Stopped 10/29/16 1101)  insulin aspart (novoLOG) injection 4 Units (4 Units Intravenous Given 10/29/16 1122)  sodium chloride 0.9 % bolus 1,000 mL (0 mLs Intravenous Stopped 10/29/16 1315)     Initial Impression / Assessment and Plan / ED Course  I have reviewed the triage vital signs and the nursing notes.  Pertinent labs & imaging results that were available during my care of the patient were reviewed by me and considered in my medical decision making (see chart for details).     Patient with back pain. Likely muscle skeletal. However urine showed elevated glucose. Likely his diabetes was returned. States he used to have stopped his medicines. Sugar improved after IV fluids and insulin. Discharge home with metformin and muscle relaxer. Will follow-up with PCP.  Final Clinical Impressions(s) / ED Diagnoses   Final  diagnoses:  Hyperglycemia  Right-sided low back pain without sciatica, unspecified chronicity    New Prescriptions Discharge Medication List as of 10/29/2016  1:00 PM    START taking these medications   Details  metFORMIN (GLUCOPHAGE) 500 MG tablet Take 1 tablet (500 mg total) by mouth 2 (two) times daily with a meal., Starting Tue 10/29/2016, Print         Benjiman CorePickering, Benno Brensinger, MD 10/29/16 1549

## 2016-10-29 NOTE — ED Triage Notes (Signed)
Pt reports right flank pain x2 weeks. Pt denies any known fevers, chills. nad noted.

## 2016-12-17 ENCOUNTER — Other Ambulatory Visit
Admission: RE | Admit: 2016-12-17 | Discharge: 2016-12-17 | Disposition: A | Discharge status: Home / Self Care | Payer: Medicaid Other | Source: Ambulatory Visit | Attending: Cardiology | Admitting: Cardiology

## 2016-12-17 DIAGNOSIS — M4620 Osteomyelitis of vertebra, site unspecified: Secondary | ICD-10-CM | POA: Diagnosis present

## 2016-12-17 LAB — CBC WITH DIFFERENTIAL/PLATELET
Basophils Absolute: 0.1 10*3/uL (ref 0–0.1)
Basophils Relative: 1 %
EOS ABS: 0.2 10*3/uL (ref 0–0.7)
EOS PCT: 3 %
HCT: 29.2 % — ABNORMAL LOW (ref 40.0–52.0)
Hemoglobin: 9.6 g/dL — ABNORMAL LOW (ref 13.0–18.0)
LYMPHS PCT: 13 %
Lymphs Abs: 1 10*3/uL (ref 1.0–3.6)
MCH: 29.8 pg (ref 26.0–34.0)
MCHC: 32.8 g/dL (ref 32.0–36.0)
MCV: 90.8 fL (ref 80.0–100.0)
MONO ABS: 0.5 10*3/uL (ref 0.2–1.0)
Monocytes Relative: 7 %
Neutro Abs: 5.9 10*3/uL (ref 1.4–6.5)
Neutrophils Relative %: 76 %
PLATELETS: 235 10*3/uL (ref 150–440)
RBC: 3.22 MIL/uL — AB (ref 4.40–5.90)
RDW: 14.9 % — AB (ref 11.5–14.5)
WBC: 7.7 10*3/uL (ref 3.8–10.6)

## 2016-12-17 LAB — BUN: BUN: 11 mg/dL (ref 6–20)

## 2016-12-17 LAB — CREATININE, SERUM
Creatinine, Ser: 0.59 mg/dL — ABNORMAL LOW (ref 0.61–1.24)
GFR calc Af Amer: >60 mL/min (ref >60)
GFR calc non Af Amer: >60 mL/min (ref >60)

## 2016-12-28 ENCOUNTER — Other Ambulatory Visit
Admission: RE | Admit: 2016-12-28 | Discharge: 2016-12-28 | Disposition: A | Discharge status: Home / Self Care | Payer: Medicaid Other | Source: Ambulatory Visit | Attending: Cardiology | Admitting: Cardiology

## 2016-12-28 DIAGNOSIS — M4620 Osteomyelitis of vertebra, site unspecified: Secondary | ICD-10-CM | POA: Diagnosis not present

## 2016-12-28 LAB — CBC WITH DIFFERENTIAL/PLATELET
BASOS ABS: 0.1 10*3/uL (ref 0–0.1)
BASOS PCT: 1 %
EOS ABS: 0.3 10*3/uL (ref 0–0.7)
Eosinophils Relative: 5 %
HEMATOCRIT: 28.9 % — AB (ref 40.0–52.0)
HEMOGLOBIN: 9.4 g/dL — AB (ref 13.0–18.0)
Lymphocytes Relative: 18 %
Lymphs Abs: 1.1 10*3/uL (ref 1.0–3.6)
MCH: 28.9 pg (ref 26.0–34.0)
MCHC: 32.5 g/dL (ref 32.0–36.0)
MCV: 89 fL (ref 80.0–100.0)
Monocytes Absolute: 0.4 10*3/uL (ref 0.2–1.0)
Monocytes Relative: 7 %
NEUTROS ABS: 4.3 10*3/uL (ref 1.4–6.5)
NEUTROS PCT: 69 %
Platelets: 194 10*3/uL (ref 150–440)
RBC: 3.25 MIL/uL — AB (ref 4.40–5.90)
RDW: 14.6 % — ABNORMAL HIGH (ref 11.5–14.5)
WBC: 6.3 10*3/uL (ref 3.8–10.6)

## 2016-12-28 LAB — CREATININE, SERUM
CREATININE: 0.52 mg/dL — AB (ref 0.61–1.24)
GFR calc Af Amer: >60 mL/min (ref >60)
GFR calc non Af Amer: >60 mL/min (ref >60)

## 2016-12-28 LAB — PROTIME-INR
INR: 1.11
PROTHROMBIN TIME: 14.3 s (ref 11.4–15.2)

## 2016-12-28 LAB — BUN: BUN: 10 mg/dL (ref 6–20)

## 2017-01-05 ENCOUNTER — Other Ambulatory Visit (HOSPITAL_COMMUNITY)
Admission: RE | Admit: 2017-01-05 | Discharge: 2017-01-05 | Disposition: A | Discharge status: Home / Self Care | Payer: Medicaid Other | Source: Other Acute Inpatient Hospital | Attending: Cardiology | Admitting: Cardiology

## 2017-01-05 DIAGNOSIS — M4620 Osteomyelitis of vertebra, site unspecified: Secondary | ICD-10-CM | POA: Insufficient documentation

## 2017-01-05 LAB — CREATININE, SERUM: Creatinine, Ser: 0.54 mg/dL — ABNORMAL LOW (ref 0.61–1.24)

## 2017-01-05 LAB — CBC WITH DIFFERENTIAL/PLATELET
BASOS ABS: 0 10*3/uL (ref 0.0–0.1)
Basophils Relative: 1 %
EOS PCT: 8 %
Eosinophils Absolute: 0.6 10*3/uL (ref 0.0–0.7)
HCT: 33.5 % — ABNORMAL LOW (ref 39.0–52.0)
Hemoglobin: 10.5 g/dL — ABNORMAL LOW (ref 13.0–17.0)
LYMPHS PCT: 27 %
Lymphs Abs: 2.2 10*3/uL (ref 0.7–4.0)
MCH: 28.6 pg (ref 26.0–34.0)
MCHC: 31.3 g/dL (ref 30.0–36.0)
MCV: 91.3 fL (ref 78.0–100.0)
MONO ABS: 0.5 10*3/uL (ref 0.1–1.0)
Monocytes Relative: 6 %
Neutro Abs: 4.9 10*3/uL (ref 1.7–7.7)
Neutrophils Relative %: 58 %
PLATELETS: 217 10*3/uL (ref 150–400)
RBC: 3.67 MIL/uL — ABNORMAL LOW (ref 4.22–5.81)
RDW: 13.7 % (ref 11.5–15.5)
WBC: 8.3 10*3/uL (ref 4.0–10.5)

## 2017-01-05 LAB — BUN: BUN: 11 mg/dL (ref 6–20)

## 2017-01-12 ENCOUNTER — Other Ambulatory Visit (HOSPITAL_COMMUNITY)
Admission: RE | Admit: 2017-01-12 | Discharge: 2017-01-12 | Disposition: A | Discharge status: Home / Self Care | Payer: Self-pay | Source: Ambulatory Visit | Attending: Cardiology | Admitting: Cardiology

## 2017-01-12 DIAGNOSIS — M4620 Osteomyelitis of vertebra, site unspecified: Secondary | ICD-10-CM | POA: Insufficient documentation

## 2018-07-14 IMAGING — DX DG LUMBAR SPINE COMPLETE 4+V
5 series · 5 of 5 positions shown · non-contrast
Comparison: None.

CLINICAL DATA: Right lower back pain, flank pain for 2 weeks

EXAM:
LUMBAR SPINE - COMPLETE 4+ VIEW

[l-spine ap]
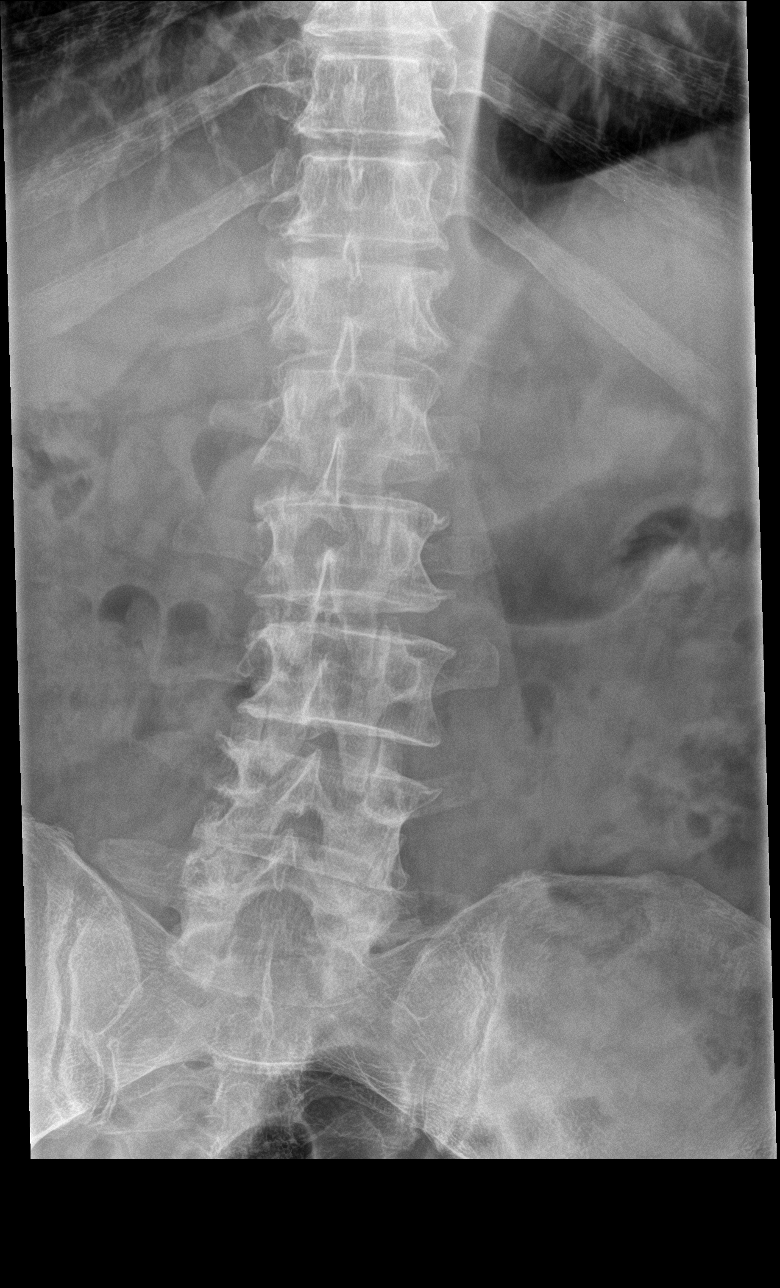

[l-spine obl (1 of 2)]
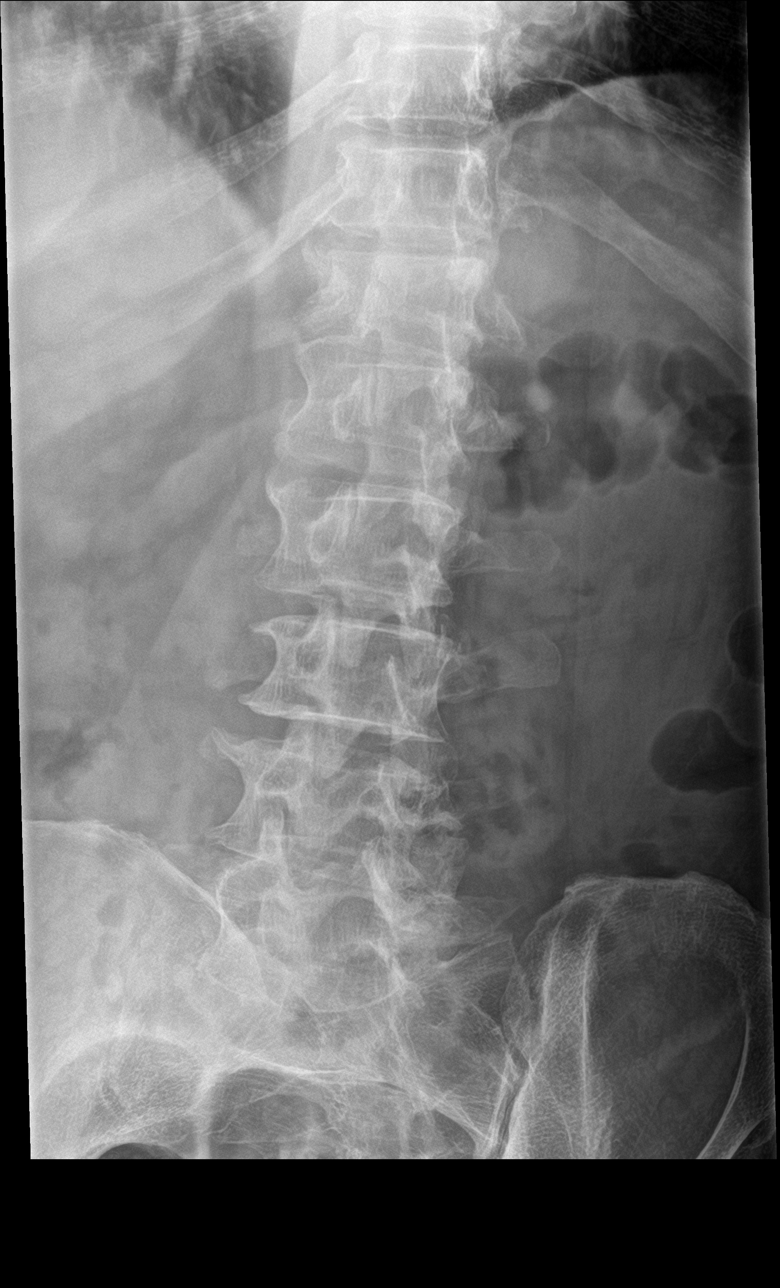

[l-spine obl (2 of 2)]
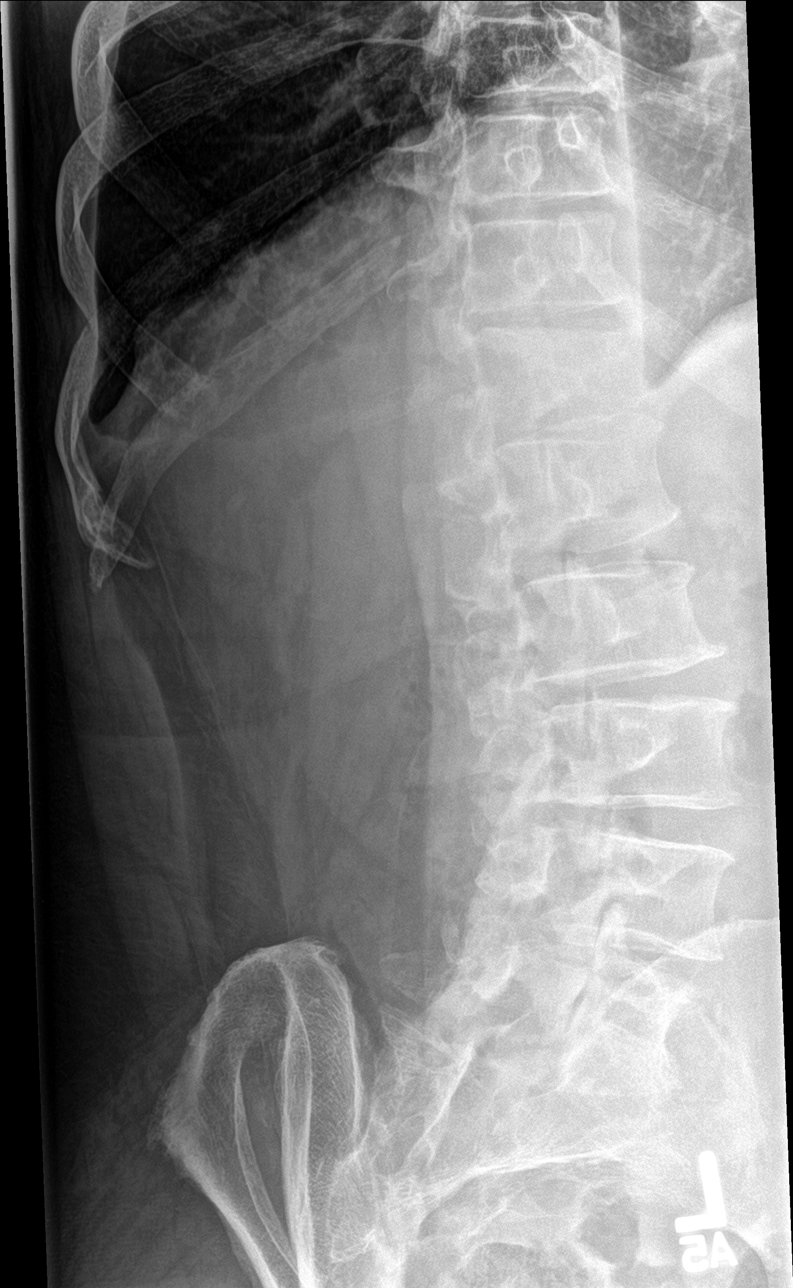

[l-spine lat]
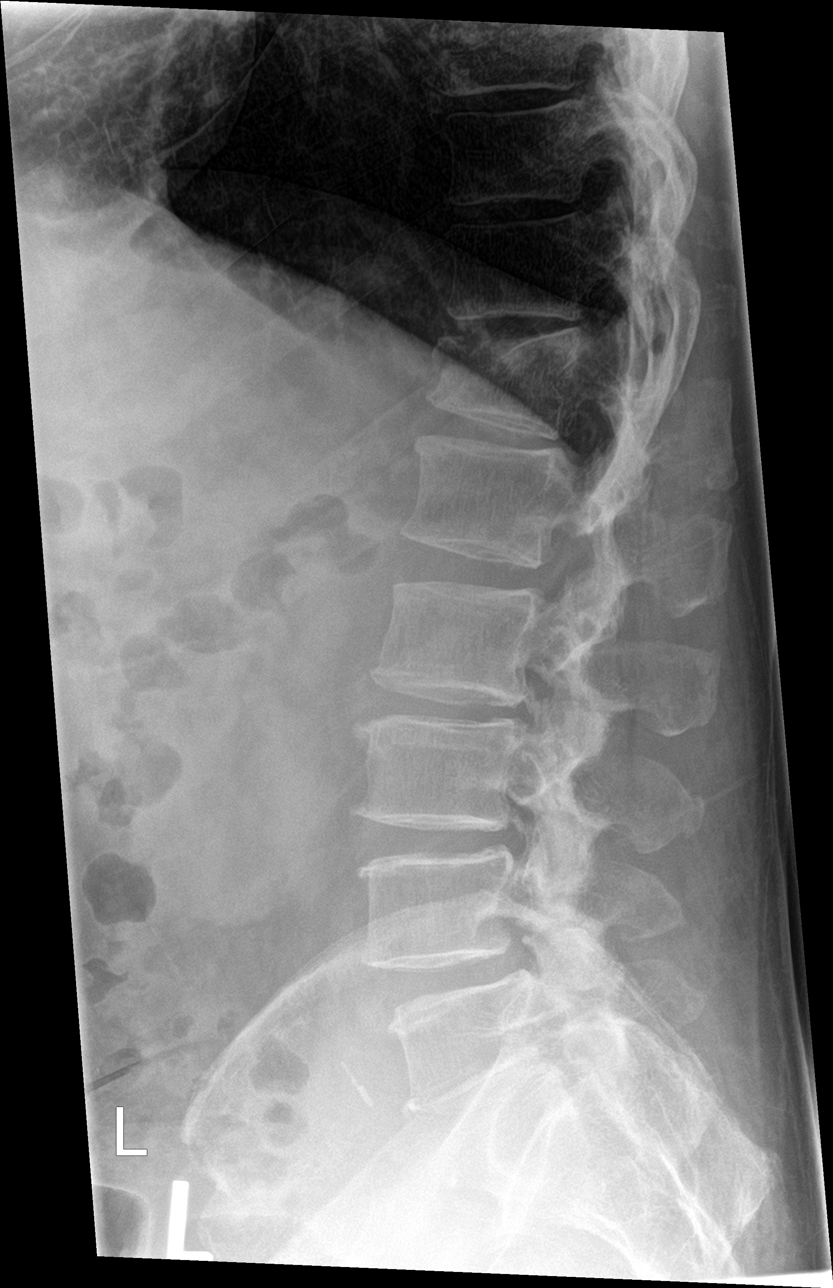

[l-spine spot]
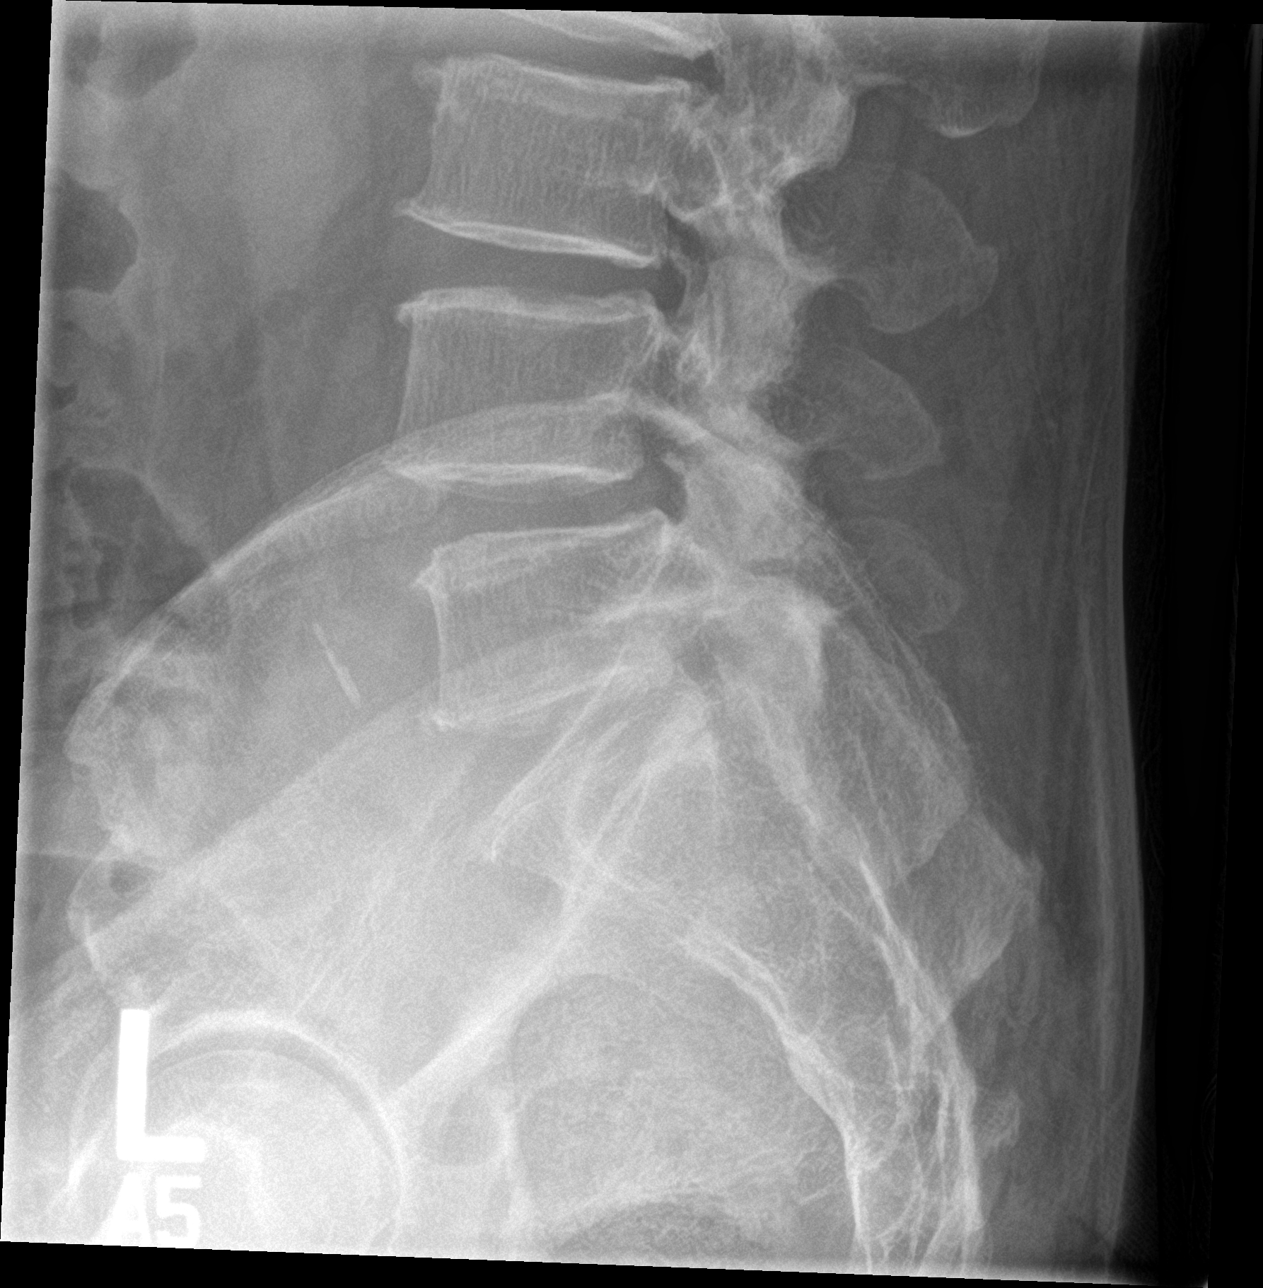

[5 of 5 positions shown; findings below may reference images not displayed]

FINDINGS: Degenerative facet disease throughout the lumbar spine. Early
degenerative spurring anteriorly from L2-3 through L5-S1. Moderate
compression fracture through the superior and 8 of T12. No
malalignment.
IMPRESSION: Moderate T12 compression fracture.

Degenerative facet disease throughout the lumbar spine.

## 2018-11-20 ENCOUNTER — Other Ambulatory Visit (HOSPITAL_COMMUNITY)
Admission: RE | Admit: 2018-11-20 | Discharge: 2018-11-20 | Disposition: A | Discharge status: Home / Self Care | Payer: Medicaid Other | Source: Other Acute Inpatient Hospital | Attending: Internal Medicine | Admitting: Internal Medicine

## 2018-11-20 DIAGNOSIS — M4620 Osteomyelitis of vertebra, site unspecified: Secondary | ICD-10-CM | POA: Insufficient documentation

## 2018-11-20 DIAGNOSIS — K859 Acute pancreatitis without necrosis or infection, unspecified: Secondary | ICD-10-CM | POA: Diagnosis present

## 2018-11-20 DIAGNOSIS — I330 Acute and subacute infective endocarditis: Secondary | ICD-10-CM | POA: Insufficient documentation

## 2018-11-20 DIAGNOSIS — A419 Sepsis, unspecified organism: Secondary | ICD-10-CM | POA: Insufficient documentation

## 2018-11-20 LAB — CBC WITH DIFFERENTIAL/PLATELET
Abs Immature Granulocytes: 0.06 10*3/uL (ref 0.00–0.07)
Basophils Absolute: 0 10*3/uL (ref 0.0–0.1)
Basophils Relative: 0 %
Eosinophils Absolute: 0.2 10*3/uL (ref 0.0–0.5)
Eosinophils Relative: 2 %
HCT: 35.6 % — ABNORMAL LOW (ref 39.0–52.0)
Hemoglobin: 11 g/dL — ABNORMAL LOW (ref 13.0–17.0)
Immature Granulocytes: 1 %
Lymphocytes Relative: 18 %
Lymphs Abs: 1.6 10*3/uL (ref 0.7–4.0)
MCH: 29.5 pg (ref 26.0–34.0)
MCHC: 30.9 g/dL (ref 30.0–36.0)
MCV: 95.4 fL (ref 80.0–100.0)
Monocytes Absolute: 0.6 10*3/uL (ref 0.1–1.0)
Monocytes Relative: 7 %
Neutro Abs: 6.5 10*3/uL (ref 1.7–7.7)
Neutrophils Relative %: 72 %
Platelets: 140 10*3/uL — ABNORMAL LOW (ref 150–400)
RBC: 3.73 MIL/uL — ABNORMAL LOW (ref 4.22–5.81)
RDW: 14.3 % (ref 11.5–15.5)
WBC: 9 10*3/uL (ref 4.0–10.5)
nRBC: 0 % (ref 0.0–0.2)

## 2018-11-20 LAB — COMPREHENSIVE METABOLIC PANEL
ALT: 32 U/L (ref 0–44)
AST: 30 U/L (ref 15–41)
Albumin: 3.2 g/dL — ABNORMAL LOW (ref 3.5–5.0)
Alkaline Phosphatase: 79 U/L (ref 38–126)
Anion gap: 11 (ref 5–15)
BUN: 9 mg/dL (ref 6–20)
CO2: 25 mmol/L (ref 22–32)
Calcium: 8.6 mg/dL — ABNORMAL LOW (ref 8.9–10.3)
Chloride: 97 mmol/L — ABNORMAL LOW (ref 98–111)
Creatinine, Ser: 0.56 mg/dL — ABNORMAL LOW (ref 0.61–1.24)
GFR calc Af Amer: >60 mL/min (ref >60)
GFR calc non Af Amer: >60 mL/min (ref >60)
Glucose, Bld: 99 mg/dL (ref 70–99)
Potassium: 3.8 mmol/L (ref 3.5–5.1)
Sodium: 133 mmol/L — ABNORMAL LOW (ref 135–145)
Total Bilirubin: 0.8 mg/dL (ref 0.3–1.2)
Total Protein: 6.9 g/dL (ref 6.5–8.1)

## 2018-11-23 ENCOUNTER — Other Ambulatory Visit (HOSPITAL_COMMUNITY)
Admission: RE | Admit: 2018-11-23 | Discharge: 2018-11-23 | Disposition: A | Discharge status: Home / Self Care | Payer: Medicaid Other | Source: Other Acute Inpatient Hospital | Attending: Internal Medicine | Admitting: Internal Medicine

## 2018-11-23 DIAGNOSIS — M4620 Osteomyelitis of vertebra, site unspecified: Secondary | ICD-10-CM | POA: Insufficient documentation

## 2018-11-23 LAB — COMPREHENSIVE METABOLIC PANEL
ALT: 28 U/L (ref 0–44)
AST: 30 U/L (ref 15–41)
Albumin: 3.3 g/dL — ABNORMAL LOW (ref 3.5–5.0)
Alkaline Phosphatase: 79 U/L (ref 38–126)
Anion gap: 17 — ABNORMAL HIGH (ref 5–15)
BUN: 10 mg/dL (ref 6–20)
CO2: 23 mmol/L (ref 22–32)
Calcium: 8.9 mg/dL (ref 8.9–10.3)
Chloride: 95 mmol/L — ABNORMAL LOW (ref 98–111)
Creatinine, Ser: 0.62 mg/dL (ref 0.61–1.24)
GFR calc Af Amer: >60 mL/min (ref >60)
GFR calc non Af Amer: >60 mL/min (ref >60)
Glucose, Bld: 118 mg/dL — ABNORMAL HIGH (ref 70–99)
Potassium: 4.7 mmol/L (ref 3.5–5.1)
Sodium: 135 mmol/L (ref 135–145)
Total Bilirubin: 0.5 mg/dL (ref 0.3–1.2)
Total Protein: 7.4 g/dL (ref 6.5–8.1)

## 2018-11-23 LAB — CBC WITH DIFFERENTIAL/PLATELET
Abs Immature Granulocytes: 0.08 10*3/uL — ABNORMAL HIGH (ref 0.00–0.07)
Basophils Absolute: 0.2 10*3/uL — ABNORMAL HIGH (ref 0.0–0.1)
Basophils Relative: 2 %
Eosinophils Absolute: 0.2 10*3/uL (ref 0.0–0.5)
Eosinophils Relative: 1 %
HCT: 37.9 % — ABNORMAL LOW (ref 39.0–52.0)
Hemoglobin: 11.1 g/dL — ABNORMAL LOW (ref 13.0–17.0)
Immature Granulocytes: 1 %
Lymphocytes Relative: 20 %
Lymphs Abs: 2.2 10*3/uL (ref 0.7–4.0)
MCH: 29.5 pg (ref 26.0–34.0)
MCHC: 29.3 g/dL — ABNORMAL LOW (ref 30.0–36.0)
MCV: 100.8 fL — ABNORMAL HIGH (ref 80.0–100.0)
Monocytes Absolute: 0.7 10*3/uL (ref 0.1–1.0)
Monocytes Relative: 7 %
Neutro Abs: 7.3 10*3/uL (ref 1.7–7.7)
Neutrophils Relative %: 69 %
Platelets: 132 10*3/uL — ABNORMAL LOW (ref 150–400)
RBC: 3.76 MIL/uL — ABNORMAL LOW (ref 4.22–5.81)
RDW: 14.4 % (ref 11.5–15.5)
WBC: 10.6 10*3/uL — ABNORMAL HIGH (ref 4.0–10.5)
nRBC: 0 % (ref 0.0–0.2)

## 2018-12-01 ENCOUNTER — Other Ambulatory Visit (HOSPITAL_COMMUNITY)
Admission: RE | Admit: 2018-12-01 | Discharge: 2018-12-01 | Disposition: A | Discharge status: Home / Self Care | Payer: Medicaid Other | Source: Ambulatory Visit | Attending: Internal Medicine | Admitting: Internal Medicine

## 2018-12-01 DIAGNOSIS — M4620 Osteomyelitis of vertebra, site unspecified: Secondary | ICD-10-CM | POA: Diagnosis present

## 2018-12-01 LAB — COMPREHENSIVE METABOLIC PANEL
ALT: 23 U/L (ref 0–44)
AST: 24 U/L (ref 15–41)
Albumin: 3.3 g/dL — ABNORMAL LOW (ref 3.5–5.0)
Alkaline Phosphatase: 67 U/L (ref 38–126)
Anion gap: 11 (ref 5–15)
BUN: 9 mg/dL (ref 6–20)
CO2: 25 mmol/L (ref 22–32)
Calcium: 8.9 mg/dL (ref 8.9–10.3)
Chloride: 98 mmol/L (ref 98–111)
Creatinine, Ser: 0.54 mg/dL — ABNORMAL LOW (ref 0.61–1.24)
GFR calc Af Amer: >60 mL/min (ref >60)
GFR calc non Af Amer: >60 mL/min (ref >60)
Glucose, Bld: 119 mg/dL — ABNORMAL HIGH (ref 70–99)
Potassium: 4.1 mmol/L (ref 3.5–5.1)
Sodium: 134 mmol/L — ABNORMAL LOW (ref 135–145)
Total Bilirubin: 0.9 mg/dL (ref 0.3–1.2)
Total Protein: 7.1 g/dL (ref 6.5–8.1)

## 2018-12-01 LAB — CBC WITH DIFFERENTIAL/PLATELET
Abs Immature Granulocytes: 0.05 10*3/uL (ref 0.00–0.07)
Basophils Absolute: 0 10*3/uL (ref 0.0–0.1)
Basophils Relative: 0 %
Eosinophils Absolute: 0.2 10*3/uL (ref 0.0–0.5)
Eosinophils Relative: 2 %
HCT: 34.4 % — ABNORMAL LOW (ref 39.0–52.0)
Hemoglobin: 11.1 g/dL — ABNORMAL LOW (ref 13.0–17.0)
Immature Granulocytes: 0 %
Lymphocytes Relative: 18 %
Lymphs Abs: 2.1 10*3/uL (ref 0.7–4.0)
MCH: 30.7 pg (ref 26.0–34.0)
MCHC: 32.3 g/dL (ref 30.0–36.0)
MCV: 95.3 fL (ref 80.0–100.0)
Monocytes Absolute: 0.8 10*3/uL (ref 0.1–1.0)
Monocytes Relative: 7 %
Neutro Abs: 8.5 10*3/uL — ABNORMAL HIGH (ref 1.7–7.7)
Neutrophils Relative %: 73 %
Platelets: 202 10*3/uL (ref 150–400)
RBC: 3.61 MIL/uL — ABNORMAL LOW (ref 4.22–5.81)
RDW: 13.8 % (ref 11.5–15.5)
WBC: 11.6 10*3/uL — ABNORMAL HIGH (ref 4.0–10.5)
nRBC: 0 % (ref 0.0–0.2)

## 2018-12-07 ENCOUNTER — Other Ambulatory Visit (HOSPITAL_COMMUNITY)
Admission: RE | Admit: 2018-12-07 | Discharge: 2018-12-07 | Disposition: A | Discharge status: Home / Self Care | Payer: Medicaid Other | Source: Other Acute Inpatient Hospital | Attending: Internal Medicine | Admitting: Internal Medicine

## 2018-12-07 DIAGNOSIS — M4620 Osteomyelitis of vertebra, site unspecified: Secondary | ICD-10-CM | POA: Diagnosis not present

## 2018-12-07 DIAGNOSIS — A419 Sepsis, unspecified organism: Secondary | ICD-10-CM | POA: Diagnosis present

## 2018-12-07 DIAGNOSIS — K859 Acute pancreatitis without necrosis or infection, unspecified: Secondary | ICD-10-CM | POA: Diagnosis not present

## 2018-12-07 DIAGNOSIS — I330 Acute and subacute infective endocarditis: Secondary | ICD-10-CM | POA: Insufficient documentation

## 2018-12-07 LAB — CBC WITH DIFFERENTIAL/PLATELET
Abs Immature Granulocytes: 0.06 10*3/uL (ref 0.00–0.07)
Basophils Absolute: 0.1 10*3/uL (ref 0.0–0.1)
Basophils Relative: 1 %
Eosinophils Absolute: 0.3 10*3/uL (ref 0.0–0.5)
Eosinophils Relative: 4 %
HCT: 34.6 % — ABNORMAL LOW (ref 39.0–52.0)
Hemoglobin: 10.3 g/dL — ABNORMAL LOW (ref 13.0–17.0)
Immature Granulocytes: 1 %
Lymphocytes Relative: 17 %
Lymphs Abs: 1.6 10*3/uL (ref 0.7–4.0)
MCH: 29.3 pg (ref 26.0–34.0)
MCHC: 29.8 g/dL — ABNORMAL LOW (ref 30.0–36.0)
MCV: 98.6 fL (ref 80.0–100.0)
Monocytes Absolute: 0.7 10*3/uL (ref 0.1–1.0)
Monocytes Relative: 7 %
Neutro Abs: 6.6 10*3/uL (ref 1.7–7.7)
Neutrophils Relative %: 70 %
Platelets: 183 10*3/uL (ref 150–400)
RBC: 3.51 MIL/uL — ABNORMAL LOW (ref 4.22–5.81)
RDW: 13.9 % (ref 11.5–15.5)
WBC: 9.4 10*3/uL (ref 4.0–10.5)
nRBC: 0 % (ref 0.0–0.2)

## 2018-12-07 LAB — COMPREHENSIVE METABOLIC PANEL
ALT: 23 U/L (ref 0–44)
AST: 24 U/L (ref 15–41)
Albumin: 3.3 g/dL — ABNORMAL LOW (ref 3.5–5.0)
Alkaline Phosphatase: 65 U/L (ref 38–126)
Anion gap: 12 (ref 5–15)
BUN: 11 mg/dL (ref 6–20)
CO2: 27 mmol/L (ref 22–32)
Calcium: 8.7 mg/dL — ABNORMAL LOW (ref 8.9–10.3)
Chloride: 100 mmol/L (ref 98–111)
Creatinine, Ser: 0.56 mg/dL — ABNORMAL LOW (ref 0.61–1.24)
GFR calc Af Amer: >60 mL/min (ref >60)
GFR calc non Af Amer: >60 mL/min (ref >60)
Glucose, Bld: 121 mg/dL — ABNORMAL HIGH (ref 70–99)
Potassium: 3.8 mmol/L (ref 3.5–5.1)
Sodium: 139 mmol/L (ref 135–145)
Total Bilirubin: 0.5 mg/dL (ref 0.3–1.2)
Total Protein: 6.7 g/dL (ref 6.5–8.1)

## 2018-12-15 ENCOUNTER — Other Ambulatory Visit (HOSPITAL_COMMUNITY)
Admission: RE | Admit: 2018-12-15 | Discharge: 2018-12-15 | Disposition: A | Discharge status: Home / Self Care | Payer: Medicaid Other | Source: Skilled Nursing Facility | Attending: Internal Medicine | Admitting: Internal Medicine

## 2018-12-15 DIAGNOSIS — M4620 Osteomyelitis of vertebra, site unspecified: Secondary | ICD-10-CM | POA: Insufficient documentation

## 2018-12-15 LAB — COMPREHENSIVE METABOLIC PANEL
ALT: 20 U/L (ref 0–44)
AST: 23 U/L (ref 15–41)
Albumin: 3.6 g/dL (ref 3.5–5.0)
Alkaline Phosphatase: 66 U/L (ref 38–126)
Anion gap: 8 (ref 5–15)
BUN: 11 mg/dL (ref 6–20)
CO2: 26 mmol/L (ref 22–32)
Calcium: 8.6 mg/dL — ABNORMAL LOW (ref 8.9–10.3)
Chloride: 101 mmol/L (ref 98–111)
Creatinine, Ser: 0.7 mg/dL (ref 0.61–1.24)
GFR calc Af Amer: >60 mL/min (ref >60)
GFR calc non Af Amer: >60 mL/min (ref >60)
Glucose, Bld: 170 mg/dL — ABNORMAL HIGH (ref 70–99)
Potassium: 3.8 mmol/L (ref 3.5–5.1)
Sodium: 135 mmol/L (ref 135–145)
Total Bilirubin: 0.7 mg/dL (ref 0.3–1.2)
Total Protein: 7 g/dL (ref 6.5–8.1)

## 2018-12-15 LAB — CBC WITH DIFFERENTIAL/PLATELET
Abs Immature Granulocytes: 0.03 10*3/uL (ref 0.00–0.07)
Basophils Absolute: 0.1 10*3/uL (ref 0.0–0.1)
Basophils Relative: 1 %
Eosinophils Absolute: 0.3 10*3/uL (ref 0.0–0.5)
Eosinophils Relative: 3 %
HCT: 32.7 % — ABNORMAL LOW (ref 39.0–52.0)
Hemoglobin: 10.2 g/dL — ABNORMAL LOW (ref 13.0–17.0)
Immature Granulocytes: 0 %
Lymphocytes Relative: 18 %
Lymphs Abs: 1.8 10*3/uL (ref 0.7–4.0)
MCH: 29.7 pg (ref 26.0–34.0)
MCHC: 31.2 g/dL (ref 30.0–36.0)
MCV: 95.3 fL (ref 80.0–100.0)
Monocytes Absolute: 0.6 10*3/uL (ref 0.1–1.0)
Monocytes Relative: 6 %
Neutro Abs: 7.6 10*3/uL (ref 1.7–7.7)
Neutrophils Relative %: 72 %
Platelets: 168 10*3/uL (ref 150–400)
RBC: 3.43 MIL/uL — ABNORMAL LOW (ref 4.22–5.81)
RDW: 13.7 % (ref 11.5–15.5)
WBC: 10.4 10*3/uL (ref 4.0–10.5)
nRBC: 0 % (ref 0.0–0.2)

## 2019-09-29 ENCOUNTER — Inpatient Hospital Stay
Admission: EM | Admit: 2019-09-29 | Discharge: 2019-10-02 | DRG: 378 | Disposition: A | Discharge status: Home / Self Care | Payer: Medicare Other | Attending: Internal Medicine | Admitting: Internal Medicine

## 2019-09-29 ENCOUNTER — Other Ambulatory Visit: Payer: Self-pay

## 2019-09-29 DIAGNOSIS — K449 Diaphragmatic hernia without obstruction or gangrene: Secondary | ICD-10-CM | POA: Diagnosis present

## 2019-09-29 DIAGNOSIS — Z6839 Body mass index (BMI) 39.0-39.9, adult: Secondary | ICD-10-CM | POA: Diagnosis not present

## 2019-09-29 DIAGNOSIS — D62 Acute posthemorrhagic anemia: Secondary | ICD-10-CM | POA: Diagnosis present

## 2019-09-29 DIAGNOSIS — K922 Gastrointestinal hemorrhage, unspecified: Secondary | ICD-10-CM

## 2019-09-29 DIAGNOSIS — E119 Type 2 diabetes mellitus without complications: Secondary | ICD-10-CM | POA: Diagnosis present

## 2019-09-29 DIAGNOSIS — Z8679 Personal history of other diseases of the circulatory system: Secondary | ICD-10-CM | POA: Diagnosis not present

## 2019-09-29 DIAGNOSIS — D500 Iron deficiency anemia secondary to blood loss (chronic): Secondary | ICD-10-CM

## 2019-09-29 DIAGNOSIS — R110 Nausea: Secondary | ICD-10-CM | POA: Diagnosis present

## 2019-09-29 DIAGNOSIS — Z953 Presence of xenogenic heart valve: Secondary | ICD-10-CM | POA: Diagnosis not present

## 2019-09-29 DIAGNOSIS — Z7901 Long term (current) use of anticoagulants: Secondary | ICD-10-CM

## 2019-09-29 DIAGNOSIS — D6959 Other secondary thrombocytopenia: Secondary | ICD-10-CM | POA: Diagnosis not present

## 2019-09-29 DIAGNOSIS — D509 Iron deficiency anemia, unspecified: Secondary | ICD-10-CM | POA: Diagnosis present

## 2019-09-29 DIAGNOSIS — Z86010 Personal history of colonic polyps: Secondary | ICD-10-CM

## 2019-09-29 DIAGNOSIS — G8929 Other chronic pain: Secondary | ICD-10-CM | POA: Diagnosis present

## 2019-09-29 DIAGNOSIS — E669 Obesity, unspecified: Secondary | ICD-10-CM | POA: Diagnosis present

## 2019-09-29 DIAGNOSIS — I10 Essential (primary) hypertension: Secondary | ICD-10-CM | POA: Diagnosis present

## 2019-09-29 DIAGNOSIS — E785 Hyperlipidemia, unspecified: Secondary | ICD-10-CM | POA: Diagnosis present

## 2019-09-29 DIAGNOSIS — Z79899 Other long term (current) drug therapy: Secondary | ICD-10-CM | POA: Diagnosis not present

## 2019-09-29 DIAGNOSIS — M5416 Radiculopathy, lumbar region: Secondary | ICD-10-CM | POA: Diagnosis present

## 2019-09-29 DIAGNOSIS — D649 Anemia, unspecified: Secondary | ICD-10-CM | POA: Diagnosis not present

## 2019-09-29 DIAGNOSIS — E1129 Type 2 diabetes mellitus with other diabetic kidney complication: Secondary | ICD-10-CM | POA: Diagnosis present

## 2019-09-29 DIAGNOSIS — Z7984 Long term (current) use of oral hypoglycemic drugs: Secondary | ICD-10-CM | POA: Diagnosis not present

## 2019-09-29 DIAGNOSIS — K31811 Angiodysplasia of stomach and duodenum with bleeding: Principal | ICD-10-CM | POA: Diagnosis present

## 2019-09-29 DIAGNOSIS — I2510 Atherosclerotic heart disease of native coronary artery without angina pectoris: Secondary | ICD-10-CM | POA: Diagnosis present

## 2019-09-29 DIAGNOSIS — Z20822 Contact with and (suspected) exposure to covid-19: Secondary | ICD-10-CM | POA: Diagnosis present

## 2019-09-29 DIAGNOSIS — L299 Pruritus, unspecified: Secondary | ICD-10-CM | POA: Diagnosis present

## 2019-09-29 DIAGNOSIS — I350 Nonrheumatic aortic (valve) stenosis: Secondary | ICD-10-CM | POA: Diagnosis not present

## 2019-09-29 DIAGNOSIS — Z833 Family history of diabetes mellitus: Secondary | ICD-10-CM

## 2019-09-29 DIAGNOSIS — Z952 Presence of prosthetic heart valve: Secondary | ICD-10-CM

## 2019-09-29 LAB — BASIC METABOLIC PANEL
Anion gap: 6 (ref 5–15)
BUN: 23 mg/dL (ref 8–23)
CO2: 27 mmol/L (ref 22–32)
Calcium: 8.5 mg/dL — ABNORMAL LOW (ref 8.9–10.3)
Chloride: 105 mmol/L (ref 98–111)
Creatinine, Ser: 0.64 mg/dL (ref 0.61–1.24)
GFR calc Af Amer: >60 mL/min (ref >60)
GFR calc non Af Amer: >60 mL/min (ref >60)
Glucose, Bld: 231 mg/dL — ABNORMAL HIGH (ref 70–99)
Potassium: 4 mmol/L (ref 3.5–5.1)
Sodium: 138 mmol/L (ref 135–145)

## 2019-09-29 LAB — CBC
HCT: 14.6 % — CL (ref 39.0–52.0)
HCT: 15.7 % — ABNORMAL LOW (ref 39.0–52.0)
Hemoglobin: 3.7 g/dL — CL (ref 13.0–17.0)
Hemoglobin: 4.3 g/dL — CL (ref 13.0–17.0)
MCH: 17.2 pg — ABNORMAL LOW (ref 26.0–34.0)
MCH: 19 pg — ABNORMAL LOW (ref 26.0–34.0)
MCHC: 25.3 g/dL — ABNORMAL LOW (ref 30.0–36.0)
MCHC: 27.4 g/dL — ABNORMAL LOW (ref 30.0–36.0)
MCV: 67.9 fL — ABNORMAL LOW (ref 80.0–100.0)
MCV: 69.5 fL — ABNORMAL LOW (ref 80.0–100.0)
Platelets: 130 10*3/uL — ABNORMAL LOW (ref 150–400)
Platelets: 104 10*3/uL — ABNORMAL LOW (ref 150–400)
RBC: 2.15 MIL/uL — ABNORMAL LOW (ref 4.22–5.81)
RBC: 2.26 MIL/uL — ABNORMAL LOW (ref 4.22–5.81)
RDW: 20.9 % — ABNORMAL HIGH (ref 11.5–15.5)
RDW: 21.2 % — ABNORMAL HIGH (ref 11.5–15.5)
WBC: 7.7 10*3/uL (ref 4.0–10.5)
WBC: 7.5 10*3/uL (ref 4.0–10.5)
nRBC: 0.7 % — ABNORMAL HIGH (ref 0.0–0.2)
nRBC: 0.8 % — ABNORMAL HIGH (ref 0.0–0.2)

## 2019-09-29 LAB — SARS CORONAVIRUS 2 BY RT PCR (HOSPITAL ORDER, PERFORMED IN ~~LOC~~ HOSPITAL LAB): SARS Coronavirus 2: NEGATIVE

## 2019-09-29 LAB — COMPREHENSIVE METABOLIC PANEL
ALT: 21 U/L (ref 0–44)
AST: 22 U/L (ref 15–41)
Albumin: 3.2 g/dL — ABNORMAL LOW (ref 3.5–5.0)
Alkaline Phosphatase: 48 U/L (ref 38–126)
Anion gap: 5 (ref 5–15)
BUN: 20 mg/dL (ref 8–23)
CO2: 26 mmol/L (ref 22–32)
Calcium: 8.2 mg/dL — ABNORMAL LOW (ref 8.9–10.3)
Chloride: 110 mmol/L (ref 98–111)
Creatinine, Ser: 0.61 mg/dL (ref 0.61–1.24)
GFR calc Af Amer: >60 mL/min (ref >60)
GFR calc non Af Amer: >60 mL/min (ref >60)
Glucose, Bld: 131 mg/dL — ABNORMAL HIGH (ref 70–99)
Potassium: 3.9 mmol/L (ref 3.5–5.1)
Sodium: 141 mmol/L (ref 135–145)
Total Bilirubin: 1.7 mg/dL — ABNORMAL HIGH (ref 0.3–1.2)
Total Protein: 5.7 g/dL — ABNORMAL LOW (ref 6.5–8.1)

## 2019-09-29 LAB — TECHNOLOGIST SMEAR REVIEW: Plt Morphology: NORMAL

## 2019-09-29 LAB — IRON AND TIBC
Iron: 12 ug/dL — ABNORMAL LOW (ref 45–182)
Saturation Ratios: 2 % — ABNORMAL LOW (ref 17.9–39.5)
TIBC: 552 ug/dL — ABNORMAL HIGH (ref 250–450)
UIBC: 540 ug/dL

## 2019-09-29 LAB — HEPATIC FUNCTION PANEL
ALT: 22 U/L (ref 0–44)
AST: 24 U/L (ref 15–41)
Albumin: 3.6 g/dL (ref 3.5–5.0)
Alkaline Phosphatase: 56 U/L (ref 38–126)
Bilirubin, Direct: 0.2 mg/dL (ref 0.0–0.2)
Indirect Bilirubin: 0.6 mg/dL (ref 0.3–0.9)
Total Bilirubin: 0.8 mg/dL (ref 0.3–1.2)
Total Protein: 6.4 g/dL — ABNORMAL LOW (ref 6.5–8.1)

## 2019-09-29 LAB — RETICULOCYTES
Immature Retic Fract: 33.9 % — ABNORMAL HIGH (ref 2.3–15.9)
RBC.: 2.08 MIL/uL — ABNORMAL LOW (ref 4.22–5.81)
Retic Count, Absolute: 81.5 10*3/uL (ref 19.0–186.0)
Retic Ct Pct: 3.9 % — ABNORMAL HIGH (ref 0.4–3.1)

## 2019-09-29 LAB — LACTATE DEHYDROGENASE: LDH: 188 U/L (ref 98–192)

## 2019-09-29 LAB — ABO/RH: ABO/RH(D): O POS

## 2019-09-29 LAB — PROTIME-INR
INR: 2 — ABNORMAL HIGH (ref 0.8–1.2)
Prothrombin Time: 21.6 seconds — ABNORMAL HIGH (ref 11.4–15.2)

## 2019-09-29 LAB — FERRITIN: Ferritin: 2 ng/mL — ABNORMAL LOW (ref 24–336)

## 2019-09-29 LAB — PREPARE RBC (CROSSMATCH)

## 2019-09-29 LAB — FOLATE: Folate: 19.5 ng/mL (ref >5.9)

## 2019-09-29 LAB — GLUCOSE, CAPILLARY
Glucose-Capillary: 109 mg/dL — ABNORMAL HIGH (ref 70–99)
Glucose-Capillary: 149 mg/dL — ABNORMAL HIGH (ref 70–99)

## 2019-09-29 LAB — PATHOLOGIST SMEAR REVIEW

## 2019-09-29 MED ORDER — SODIUM CHLORIDE 0.9 % IV SOLN
80.0000 mg | Freq: Once | INTRAVENOUS | Status: AC
  Administered 2019-09-29: 80 mg via INTRAVENOUS
  Filled 2019-09-29: qty 80

## 2019-09-29 MED ORDER — ACETAMINOPHEN 325 MG PO TABS: 650.0000 mg | ORAL_TABLET | Freq: Four times a day (QID) | ORAL | Status: DC | PRN

## 2019-09-29 MED ORDER — ONDANSETRON HCL 4 MG/2ML IJ SOLN: 4.0000 mg | Freq: Three times a day (TID) | INTRAMUSCULAR | Status: DC | PRN

## 2019-09-29 MED ORDER — SODIUM CHLORIDE 0.9 % IV SOLN
8.0000 mg/h | INTRAVENOUS | Status: AC
  Administered 2019-09-29 – 2019-10-02 (×7): 8 mg/h via INTRAVENOUS
  Filled 2019-09-29 (×7): qty 80

## 2019-09-29 MED ORDER — INSULIN ASPART 100 UNIT/ML ~~LOC~~ SOLN: 0.0000 [IU] | Freq: Every day | SUBCUTANEOUS | Status: DC

## 2019-09-29 MED ORDER — INSULIN ASPART 100 UNIT/ML ~~LOC~~ SOLN
0.0000 [IU] | Freq: Three times a day (TID) | SUBCUTANEOUS | Status: DC
  Administered 2019-09-30 – 2019-10-02 (×2): 1 [IU] via SUBCUTANEOUS
  Filled 2019-09-29 (×2): qty 1

## 2019-09-29 MED ORDER — SODIUM CHLORIDE 0.9% IV SOLUTION
Freq: Once | INTRAVENOUS | Status: AC
  Filled 2019-09-29: qty 250

## 2019-09-29 MED ORDER — SENNOSIDES-DOCUSATE SODIUM 8.6-50 MG PO TABS: 1.0000 | ORAL_TABLET | Freq: Every evening | ORAL | Status: DC | PRN

## 2019-09-29 MED ORDER — FERROUS SULFATE 325 (65 FE) MG PO TABS
325.0000 mg | ORAL_TABLET | Freq: Two times a day (BID) | ORAL | Status: DC
  Administered 2019-09-30: 325 mg via ORAL
  Filled 2019-09-29 (×2): qty 1

## 2019-09-29 MED ORDER — SODIUM CHLORIDE 0.9 % IV SOLN
510.0000 mg | Freq: Once | INTRAVENOUS | Status: AC
  Administered 2019-09-29: 510 mg via INTRAVENOUS
  Filled 2019-09-29: qty 17

## 2019-09-29 MED ORDER — SODIUM CHLORIDE 0.9% FLUSH
3.0000 mL | Freq: Once | INTRAVENOUS | Status: AC
  Administered 2019-09-29: 3 mL via INTRAVENOUS

## 2019-09-29 MED ORDER — HYDRALAZINE HCL 20 MG/ML IJ SOLN: 5.0000 mg | INTRAMUSCULAR | Status: DC | PRN

## 2019-09-29 MED ORDER — SODIUM CHLORIDE 0.9 % IV BOLUS
500.0000 mL | Freq: Once | INTRAVENOUS | Status: AC
  Administered 2019-09-29: 500 mL via INTRAVENOUS

## 2019-09-29 MED ORDER — SODIUM CHLORIDE 0.9 % IV SOLN: INTRAVENOUS | Status: DC

## 2019-09-29 MED ORDER — ATORVASTATIN CALCIUM 20 MG PO TABS
40.0000 mg | ORAL_TABLET | Freq: Every day | ORAL | Status: DC
  Administered 2019-09-30 – 2019-10-01 (×3): 40 mg via ORAL
  Filled 2019-09-29 (×3): qty 2

## 2019-09-29 MED ORDER — AMOXICILLIN 500 MG PO CAPS
500.0000 mg | ORAL_CAPSULE | Freq: Two times a day (BID) | ORAL | Status: DC
  Administered 2019-09-30 – 2019-10-02 (×6): 500 mg via ORAL
  Filled 2019-09-29 (×7): qty 1

## 2019-09-29 NOTE — H&P (Signed)
History and Physical    Zvi Duplantis GDJ:[id number] DOB: 03-Nov-1960 DOA: 09/29/2019  Referring MD/NP/PA:   [facility]P: [name]   Patient coming from:  The patient is coming from home.  At baseline, pt is independent for most of ADL.        Chief Complaint: Shortness of breath, dizziness, weakness  HPI: Kekoa Fyock is a 59 y.o. 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  Pt speaks Spanish, and understands little [place].  History is obtained with help of iPad translator. Pt states that he has been having generalized weakness, shortness of breath, lightheadedness, dizziness for more than 1 week.  The symptoms have been progressively worsening. He does note that he has had increasingly dark stools over the same time period, though he thought this was due to eating a large amount of black beans. Patient does not have chest pain, cough, nausea, vomiting, diarrhea or abdominal pain.  No fever or chills. No symptoms of UTI or unilateral weakness. Pt was seen in clinic yesterday and had lab drawn.  He was found to have low hemoglobin 3.9 (10.2 on 12/15/2018).  Patient was advised to come to ED for further evaluation and treatment.  ED Course: pt was found to have WBC 7.7, INR 2.0, anemia panel showed iron deficiency, LDH 188, negative Covid PCR, electrolytes renal function okay, temperature normal, blood pressure 102/59, heart rate 63, oxygen saturation 97% on room air.  Patient is admitted to progressive bed as inpatient.  Review of Systems:   General: no fevers, chills, no body weight gain, has fatigue HEENT: no blurry vision, hearing changes or sore throat Respiratory: has dyspnea, no coughing,  wheezing CV: no chest pain, no palpitations GI: no nausea, vomiting, abdominal pain, diarrhea, constipation GU: no dysuria, burning on urination, increased urinary frequency, hematuria  Ext: no leg edema Neuro: no unilateral weakness, numbness, or tingling, no vision change or hearing loss. Has dizziness. Skin: no rash, no skin tear. MSK: No muscle spasm, no deformity, no limitation of range of movement in spin Heme: No easy bruising.  Travel history: No recent long distant travel.  Allergy: No Known Allergies  Past Medical History:  Diagnosis Date  . Chronic back pain   . Diabetes mellitus without complication (HCC)   . Lumbar radiculopathy     Past Surgical History:  Procedure Laterality Date  . KNEE SURGERY      Social History:  reports that he has never smoked. He has never used smokeless tobacco. He reports current alcohol use of about 2.0 standard drinks of alcohol per week. He reports that he does not use drugs.  Family History:  Family History  Problem Relation Age of Onset  . Diabetes Mellitus II Mother      Prior to Admission medications   Medication Sig Start Date End Date Taking? Authorizing Provider  amoxicillin (AMOXIL) 500 MG capsule Take 500 mg by mouth 2 (two) times daily.   Yes [provider]  atorvastatin (LIPITOR) 40 MG tablet Take 40 mg by mouth daily.   Yes [provider]  furosemide (LASIX) 20 MG tablet Take 20 mg by mouth daily.   Yes [provider]  losartan (COZAAR) 50 MG tablet Take 50 mg by mouth daily.   Yes [provider]  metFORMIN (GLUCOPHAGE-XR)  500 MG 24 hr tablet Take 500 mg by mouth daily.   Yes [provider]    Physical Exam: Vitals:   09/29/19 1800 09/29/19 1815 09/29/19 1830 09/29/19 1845  BP: 112/71 (!) 104/56 (!) 109/95 (!) 113/58  Pulse: 64 61 69 61  Resp: 18 19 (!) 22 16  Temp:      TempSrc:      SpO2: 100% 100% 100% 100%  Weight:      Height:       General: Not in acute  distress. Pale looking. HEENT:       Eyes: PERRL, EOMI, no scleral icterus.       ENT: No discharge from the ears and nose, no pharynx injection, no tonsillar enlargement.        Neck: No JVD, no bruit, no mass felt. Heme: No neck lymph node enlargement. Cardiac: S1/S2, RRR, No murmurs, No gallops or rubs. Respiratory: No rales, wheezing, rhonchi or rubs. GI: Soft, nondistended, nontender, no rebound pain, no organomegaly, BS present. GU: No hematuria Ext: No pitting leg edema bilaterally. 2+DP/PT pulse bilaterally. Musculoskeletal: No joint deformities, No joint redness or warmth, no limitation of ROM in spin. Skin: No rashes.  Neuro: Alert, oriented X3, cranial nerves II-XII grossly intact, moves all extremities normally.  Psych: Patient is not psychotic, no suicidal or hemocidal ideation.  Labs on Admission: I have personally reviewed following labs and imaging studies  CBC: Recent Labs  Lab 09/29/19 1126  WBC 7.7  HGB 3.7*  HCT 14.6*  MCV 67.9*  PLT 161*   Basic Metabolic Panel: Recent Labs  Lab 09/29/19 1126  NA 138  K 4.0  CL 105  CO2 27  GLUCOSE 231*  BUN 23  CREATININE 0.64  CALCIUM 8.5*   GFR: Estimated Creatinine Clearance: 103.4 mL/min (by C-G formula based on SCr of 0.64 mg/dL). Liver Function Tests: Recent Labs  Lab 09/29/19 1329  AST 24  ALT 22  ALKPHOS 56  BILITOT 0.8  PROT 6.4*  ALBUMIN 3.6   No results for input(s): LIPASE, AMYLASE in the last 168 hours. No results for input(s): AMMONIA in the last 168 hours. Coagulation Profile: Recent Labs  Lab 09/29/19 1126  INR 2.0*   Cardiac Enzymes: No results for input(s): CKTOTAL, CKMB, CKMBINDEX, TROPONINI in the last 168 hours. BNP (last 3 results) No results for input(s): PROBNP in the last 8760 hours. HbA1C: No results for input(s): HGBA1C in the last 72 hours. CBG: Recent Labs  Lab 09/29/19 1715  GLUCAP 109*   Lipid Profile: No results for input(s): CHOL, HDL, LDLCALC, TRIG,  CHOLHDL, LDLDIRECT in the last 72 hours. Thyroid Function Tests: No results for input(s): TSH, T4TOTAL, FREET4, T3FREE, THYROIDAB in the last 72 hours. Anemia Panel: Recent Labs    09/29/19 1126 09/29/19 1329  FOLATE  --  19.5  FERRITIN 2*  --   TIBC 552*  --   IRON 12*  --   RETICCTPCT  --  3.9*   Urine analysis:    Component Value Date/Time   COLORURINE YELLOW 10/29/2016 [place] 10/29/2016 0753   LABSPEC 1.015 10/29/2016 0753   PHURINE 6.0 10/29/2016 0753   GLUCOSEU >=500 (A) 10/29/2016 0753   HGBUR NEGATIVE 10/29/2016 0753   BILIRUBINUR NEGATIVE 10/29/2016 0753   KETONESUR 40 (A) 10/29/2016 0753   PROTEINUR NEGATIVE 10/29/2016 0753   UROBILINOGEN 0.2 08/01/2008 1221   NITRITE NEGATIVE 10/29/2016 0753   LEUKOCYTESUR NEGATIVE 10/29/2016 0753   Sepsis Labs: @LABRCNTIP (procalcitonin:4,lacticidven:4) )  Recent Results (from the past 240 hour(s))  SARS Coronavirus 2 by RT PCR (hospital order, performed in [facility] hospital lab) Nasopharyngeal Nasopharyngeal Swab     Status: None   Collection Time: 09/29/19  1:29 PM   Specimen: Nasopharyngeal Swab  Result Value Ref Range Status   SARS Coronavirus 2 NEGATIVE NEGATIVE Final    Comment: (NOTE) SARS-CoV-2 target nucleic acids are NOT DETECTED. The SARS-CoV-2 RNA is generally detectable in upper and lower respiratory specimens during the acute phase of infection. The lowest concentration of SARS-CoV-2 viral copies this assay can detect is 250 copies / mL. A negative result does not preclude SARS-CoV-2 infection and should not be used as the sole basis for treatment or other patient management decisions.  A negative result may occur with improper specimen collection / handling, submission of specimen other than nasopharyngeal swab, presence of viral mutation(s) within the areas targeted by this assay, and inadequate number of viral copies (<250 copies / mL). A negative result must be combined with  clinical observations, patient history, and epidemiological information. Fact Sheet for Patients:   BoilerBrush.com.cy Fact Sheet for Healthcare Providers: https://pope.com/ This test is not yet approved or cleared  by the [place] FDA and has been authorized for detection and/or diagnosis of SARS-CoV-2 by FDA under an Emergency Use Authorization (EUA).  This EUA will remain in effect (meaning this test can be used) for the duration of the COVID-19 declaration under Section 564(b)(1) of the Act, 21 U.S.C. section 360bbb-3(b)(1), unless the authorization is terminated or revoked sooner. Performed at [facility], [address] [place], [place] [address]      Radiological Exams on Admission: No results found.   EKG: Independently reviewed.  Sinus rhythm, QTC 451, no ischemic change.   Assessment/Plan Principal Problem:   Symptomatic anemia Active Problems:   GIB (gastrointestinal bleeding)   H/O mechanical aortic valve replacement   HTN (hypertension)   HLD (hyperlipidemia)   Type II diabetes mellitus with renal manifestations (HCC)   Iron deficiency anemia   Symptomatic anemia and Iron deficiency anemia due to possible UGBI: Hgb dropped from 10.2 -->3.7.  Currently hemodynamically stable. Dr. Norma Fredrickson of GI is consulted.   - will admitted to progressive bed as inpatient - transfuse 4 units of blood now - GI consulted by Ed, will follow up recommendations - NPO for possible EGD - IVF: 500 mL NS bolus, then at 75 mL/hr - Start IV pantoprazole gtt - Zofran IV for nausea - Avoid NSAIDs and SQ heparin - Maintain IV access (2 large bore IVs if possible). - Monitor closely and follow q6h cbc, transfuse as necessary, if Hgb<7.0 - LaB: INR, PTT and type screen -will give 5 to 10 mg of Feraheme IV -Start iron supplement tomorrow   H/O mechanical aortic valve replacement complicated by endocarditis:  Patient is on  Coumadin.  INR is 2.0 today.  Cardiology, Dr. Gwen Pounds is consulted, who recommended to hold Coumadin. -Hold Coumadin -Continue amoxicillin  HTN (hypertension): -hold Lasix and Cozaar since patient is at risk of developing hypotension due to severe anemia -IV hydralazine as needed  HLD (hyperlipidemia) -Lipitor  Type II diabetes mellitus with renal manifestations (HCC): Most recent A1c not on record. Patient is taking Metformin at home.  -SSI -check A1c      DVT ppx: SCD Code Status: Full code Family Communication: not done, no family member is at bed side. Disposition Plan:  Anticipate discharge back to previous home environment Consults called:  GI,  Dr. Norma Fredrickson and Card, Dr. Gwen Pounds Admission status:  progressive unit for obs   Status is: Inpatient  Remains inpatient appropriate because:Inpatient level of care appropriate due to severity of illness. Pt has multiple comorbidities, now presents with possible GI bleeding, and severe anemia.  Hemoglobin dropped from 10.2 -->3.7. 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  Patient is at high risk of deteriorating.  Will need to be treated in hospital for at least 2 days.  Dispo: The patient is from: Home              Anticipated d/c is to: Home              Anticipated d/c date is: 2 days              Patient currently is not medically stable to d/c.        Date of Service 09/29/2019    Lorretta Harp [facility]   If 7PM-7AM, please contact night-coverage [email or web address] 09/29/2019, 6:49 PM

## 2019-09-29 NOTE — ED Triage Notes (Signed)
[facility] sent from [name] for abnormal labs. Per Jefm Petty, pt has a hbg of 3.9 and has been feeling dizzy, weak and SOB

## 2019-09-29 NOTE — ED Triage Notes (Addendum)
Pt comes via POV from home with c/o dizziness and weakness. Pt states he went to clinic yesterday and they did blood work. Pt was advised to come to ED.  Pt states this all started yesterday. Pt states he gets real dizzy when he tries to stand. Pt denies any pain.  Pt denies any CP. Pt states some trouble breathing.  Clinic reports hgb of 3.9. Pt unsure of any black stools

## 2019-09-29 NOTE — ED Provider Notes (Signed)
[facility] Emergency Department Provider Note  ____________________________________________   First MD Initiated Contact with Patient 09/29/19 1242     (approximate)  I have reviewed the triage vital signs and the nursing notes.   HISTORY  Chief Complaint Weakness and Dizziness    HPI Joseph Raymond is a 59 y.o. 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  The patient states that for the last week, he has had progressive worsening lightheadedness upon standing and generalized weakness.  He has noticed that he has been short of breath with exertion.  He has felt generally fatigued.  He does note that he has had increasingly dark stools over the same time period, though he thought this was due to eating a large amount of black beans.  Denies any abdominal pain.  No hematemesis.  He states this symptom feels similar to his previous episode of GI bleed.  He has not had a EGD or colonoscopy since then.  No fevers or chills.  No other complaints.  He does note some recent weight loss over the last several weeks without change in diet.        Past Medical History:  Diagnosis Date  . Chronic back pain   . Diabetes mellitus without complication ([place])   . Lumbar radiculopathy     Patient Active Problem List   Diagnosis Date Noted  . Symptomatic anemia 09/29/2019  . GIB (gastrointestinal bleeding) 09/29/2019  . H/O mechanical aortic valve replacement 09/29/2019  . HTN (hypertension) 09/29/2019  . HLD (hyperlipidemia) 09/29/2019  . Type II diabetes mellitus with renal manifestations ([place]) 09/29/2019  . Iron deficiency anemia 09/29/2019    Past Surgical History:  Procedure Laterality Date  . KNEE SURGERY      Prior to Admission medications   Medication Sig Start Date End Date Taking?  Authorizing Provider  amoxicillin (AMOXIL) 500 MG capsule Take 500 mg by mouth 2 (two) times daily.   Yes [provider]  atorvastatin (LIPITOR) 40 MG tablet Take 40 mg by mouth daily.   Yes [provider]  furosemide (LASIX) 20 MG tablet Take 20 mg by mouth daily.   Yes [provider]  losartan (COZAAR) 50 MG tablet Take 50 mg by mouth daily.   Yes [provider]  metFORMIN (GLUCOPHAGE-XR) 500 MG 24 hr tablet Take 500 mg by mouth daily.   Yes [provider]  warfarin (COUMADIN) 1 MG tablet Take 1 mg by mouth daily. (7mg  total)   Yes [provider]  warfarin (COUMADIN) 6 MG tablet Take 6 mg by mouth daily. (7mg  daily)   Yes [provider]    Allergies Patient has no known allergies.  No family history on file.  Social History Social History   Tobacco Use  . Smoking status: Never Smoker  . Smokeless tobacco: Never Used  Substance Use Topics  . Alcohol use: Yes    Alcohol/week: 2.0 standard drinks    Types: 2 Cans of beer per week    Comment: occ  . Drug use: No    Review of Systems  Review of Systems  Constitutional: Positive for fatigue. Negative for chills and fever.  HENT: Negative for sore throat.   Respiratory: Negative for shortness of breath.   Cardiovascular: Negative for chest pain.  Gastrointestinal: Positive for blood in stool. Negative for abdominal  pain.  Genitourinary: Negative for flank pain.  Musculoskeletal: Negative for neck pain.  Skin: Negative for rash and wound.  Allergic/Immunologic: Negative for immunocompromised state.  Neurological: Positive for weakness and light-headedness. Negative for numbness.  Hematological: Does not bruise/bleed easily.  All other systems reviewed and are negative.    ____________________________________________  PHYSICAL EXAM:      VITAL SIGNS: ED Triage Vitals  Enc Vitals Group     BP 09/29/19 1123 (!) 104/50     Pulse Rate 09/29/19 1123 72      Resp 09/29/19 1123 18     Temp 09/29/19 1123 98.3 F (36.8 C)     Temp src --      SpO2 09/29/19 1123 97 %     Weight 09/29/19 1122 220 lb (99.8 kg)     Height 09/29/19 1122 5\' 4"  (1.626 m)     Head Circumference --      Peak Flow --      Pain Score 09/29/19 1123 0     Pain Loc --      Pain Edu? --      Excl. in GC? --      Physical Exam Vitals and nursing note reviewed.  Constitutional:      General: He is not in acute distress.    Appearance: He is well-developed.  HENT:     Head: Normocephalic and atraumatic.     Mouth/Throat:     Mouth: Mucous membranes are dry.  Eyes:     Conjunctiva/sclera: Conjunctivae normal.  Cardiovascular:     Rate and Rhythm: Normal rate and regular rhythm.     Heart sounds: Normal heart sounds.  Pulmonary:     Effort: Pulmonary effort is normal. No respiratory distress.     Breath sounds: No wheezing.  Abdominal:     General: There is no distension.  Musculoskeletal:     Cervical back: Neck supple.  Skin:    General: Skin is warm.     Capillary Refill: Capillary refill takes less than 2 seconds.     Coloration: Skin is pale.     Findings: No rash.  Neurological:     Mental Status: He is alert and oriented to person, place, and time.     Motor: No abnormal muscle tone.       ____________________________________________   LABS (all labs ordered are listed, but only abnormal results are displayed)  Labs Reviewed  BASIC METABOLIC PANEL - Abnormal; Notable for the following components:      Result Value   Glucose, Bld 231 (*)    Calcium 8.5 (*)    All other components within normal limits  CBC - Abnormal; Notable for the following components:   RBC 2.15 (*)    Hemoglobin 3.7 (*)    HCT 14.6 (*)    MCV 67.9 (*)    MCH 17.2 (*)    MCHC 25.3 (*)    RDW 20.9 (*)    Platelets 130 (*)    nRBC 0.7 (*)    All other components within normal limits  FERRITIN - Abnormal; Notable for the following components:   Ferritin 2 (*)     All other components within normal limits  IRON AND TIBC - Abnormal; Notable for the following components:   Iron 12 (*)    TIBC 552 (*)    Saturation Ratios 2 (*)    All other components within normal limits  PROTIME-INR - Abnormal; Notable for the following components:  Prothrombin Time 21.6 (*)    INR 2.0 (*)    All other components within normal limits  RETICULOCYTES - Abnormal; Notable for the following components:   Retic Ct Pct 3.9 (*)    RBC. 2.08 (*)    Immature Retic Fract 33.9 (*)    All other components within normal limits  HEPATIC FUNCTION PANEL - Abnormal; Notable for the following components:   Total Protein 6.4 (*)    All other components within normal limits  SARS CORONAVIRUS 2 BY RT PCR (HOSPITAL ORDER, PERFORMED IN [facility])  FOLATE  LACTATE DEHYDROGENASE  TECHNOLOGIST SMEAR REVIEW  PATHOLOGIST SMEAR REVIEW  URINALYSIS, COMPLETE (UACMP) WITH MICROSCOPIC  VITAMIN B12  HAPTOGLOBIN  COMPREHENSIVE METABOLIC PANEL  CBC  CBC  CBC  HEMOGLOBIN A1C  TYPE AND SCREEN  ABO/RH  PREPARE RBC (CROSSMATCH)    ____________________________________________  EKG: Normal sinus rhythm, ventricular rate 73.  PR 134, QRS 88, QTc 451.  No acute ST elevations or depressions.  No EKG evidence of acute ischemia or infarct. ________________________________________  RADIOLOGY All imaging, including plain films, CT scans, and ultrasounds, independently reviewed by me, and interpretations confirmed via formal radiology reads.  ED MD interpretation:     Official radiology report(s): No results found.  ____________________________________________  PROCEDURES   Procedure(s) performed (including Critical Care):  .1-3 Lead EKG Interpretation Performed by: Shaune Pollack, MD Authorized by: Shaune Pollack, MD     Interpretation: normal     ECG rate:  60-70   ECG rate assessment: normal     Rhythm: sinus rhythm     Ectopy: none     Conduction: normal     Comments:     Indication: anemia, receiving transfusions .Critical Care Performed by: Shaune Pollack, MD Authorized by: Shaune Pollack, MD   Critical care provider statement:    Critical care time (minutes):  35   Critical care time was exclusive of:  Separately billable procedures and treating other patients and teaching time   Critical care was necessary to treat or prevent imminent or life-threatening deterioration of the following conditions:  Circulatory failure, cardiac failure and respiratory failure   Critical care was time spent personally by me on the following activities:  Development of treatment plan with patient or surrogate, discussions with consultants, evaluation of patient's response to treatment, examination of patient, obtaining history from patient or surrogate, ordering and performing treatments and interventions, ordering and review of laboratory studies, ordering and review of radiographic studies, pulse oximetry, re-evaluation of patient's condition and review of old charts   I assumed direction of critical care for this patient from another provider in my specialty: no      ____________________________________________  INITIAL IMPRESSION / MDM / ASSESSMENT AND PLAN / ED COURSE  As part of my medical decision making, I reviewed the following data within the electronic MEDICAL RECORD NUMBER Nursing notes reviewed and incorporated, Old chart reviewed, Notes from prior ED visits, and [place] Controlled Substance Database       *Joseph Raymond was evaluated in Emergency Department on 09/29/2019 for the symptoms described in the history of present illness. He was evaluated in the context of the global COVID-19 pandemic, which necessitated consideration that the patient might be at risk for infection with the SARS-CoV-2 virus that causes COVID-19. Institutional protocols and algorithms that pertain to the evaluation of patients at risk for COVID-19 are in a state of rapid  change based on information released by regulatory bodies including the CDC and  federal and state organizations. These policies and algorithms were followed during the patient's care in the ED.  Some ED evaluations and interventions may be delayed as a result of limited staffing during the pandemic.*  Clinical Course as of Sep 28 1656  Wed Sep 29, 2019  1586 59 year old male here with generalized weakness and lightheadedness upon standing.  Primary suspicion is acute upper GI bleed, with symptomatic anemia.  Hemoglobin 3.7 here.  Patient has extremely low iron, ferritin, and elevated TIBC with elevated RDW consistent with iron deficiency anemia.  Given that he is hemodynamically stable with this profound of anemia, I suspect this has been somewhat chronic.  He does have a history of an aortic valve, though LDH is normal and I do not suspect he is actively hemolyzing.  Moreover, lab work is more consistent with iron deficiency.  Will plan to admit to medicine.   [CI]    Clinical Course User Index [CI] Shaune Pollack, MD    Medical Decision Making:  As above. Admit to medicine. Schistocytes noted on smear but normal LDH and he has had schistocytes chronically per review of [facility] notes, likely 2/2 his mechanical valve - doubt acute hemolysis.  ____________________________________________  FINAL CLINICAL IMPRESSION(S) / ED DIAGNOSES  Final diagnoses:  Blood loss anemia  Anticoagulated on Coumadin  Symptomatic anemia     MEDICATIONS GIVEN DURING THIS VISIT:  Medications  pantoprazole (PROTONIX) 80 mg in sodium chloride 0.9 % 100 mL (0.8 mg/mL) infusion (8 mg/hr Intravenous New Bag/Given 09/29/19 1436)  0.9 %  sodium chloride infusion ( Intravenous Paused 09/29/19 1638)  ondansetron (ZOFRAN) injection 4 mg (has no administration in time range)  hydrALAZINE (APRESOLINE) injection 5 mg (has no administration in time range)  acetaminophen (TYLENOL) tablet 650 mg (has no administration in time  range)  insulin aspart (novoLOG) injection 0-5 Units (has no administration in time range)  insulin aspart (novoLOG) injection 0-9 Units (0 Units Subcutaneous Hold 09/29/19 1642)  ferumoxytol (FERAHEME) 510 mg in sodium chloride 0.9 % 100 mL IVPB (has no administration in time range)  sodium chloride flush (NS) 0.9 % injection 3 mL (3 mLs Intravenous Given 09/29/19 1222)  0.9 %  sodium chloride infusion (Manually program via Guardrails IV Fluids) ( Intravenous Stopped 09/29/19 1645)  pantoprazole (PROTONIX) 80 mg in sodium chloride 0.9 % 100 mL IVPB (0 mg Intravenous Stopped 09/29/19 1512)  sodium chloride 0.9 % bolus 500 mL (0 mLs Intravenous Stopped 09/29/19 1637)     ED Discharge Orders    None       Note:  This document was prepared using Dragon voice recognition software and may include unintentional dictation errors.   Shaune Pollack, MD 09/29/19 [phone]

## 2019-09-29 NOTE — Consult Note (Signed)
[facility] Cardiology Consultation Note  Patient ID: Joseph Raymond, MRN: [id number], DOB/AGE: May 12, 1961 59 y.o. Admit date: 09/29/2019   Date of Consult: 09/29/2019 Primary Physician: [name] Primary Cardiologist: [name]  Chief Complaint:  Chief Complaint  Patient presents with  . Weakness  . Dizziness   Reason for Consult: Anemia  HPI: 59 y.o. male with known cardiovascular disease hypertension hyperlipidemia with previous endocarditis status post previous mitral valve and aortic valve replacement currently having weakness fatigue and shortness of breath.  When seen in the emergency room the patient had significant anemia 3.7 hemoglobin.  He did not have any congestive heart failure type symptoms or angina.  EKG and troponin levels as well as blood pressure were reasonably controlled.  The patient is set for having treatment with packed red blood cells for improvements.  There is no evidence of overt type of bleeding although there is some assumption that the patient may be having some abnormal stools and some possible AVMs which could be causing some slow bleed.  The patient currently is stable at this time  Past Medical History:  Diagnosis Date  . Chronic back pain   . Diabetes mellitus without complication (HCC)   . Lumbar radiculopathy       Surgical History:  Past Surgical History:  Procedure Laterality Date  . KNEE SURGERY       Home Meds: Prior to Admission medications   Medication Sig Start Date End Date Taking? Authorizing Provider  amoxicillin (AMOXIL) 500 MG capsule Take 500 mg by mouth 2 (two) times daily.   Yes [provider]  atorvastatin (LIPITOR) 40 MG tablet Take 40 mg by mouth daily.   Yes [provider]  furosemide (LASIX) 20 MG tablet Take 20 mg by mouth daily.   Yes [provider]  losartan (COZAAR) 50 MG tablet Take 50 mg by mouth daily.   Yes [provider]  metFORMIN  (GLUCOPHAGE-XR) 500 MG 24 hr tablet Take 500 mg by mouth daily.   Yes [provider]  warfarin (COUMADIN) 1 MG tablet Take 1 mg by mouth daily. (7mg  total)   Yes [provider]  warfarin (COUMADIN) 6 MG tablet Take 6 mg by mouth daily. (7mg  daily)   Yes [provider]    Inpatient Medications:  . insulin aspart  0-5 Units Subcutaneous QHS  . insulin aspart  0-9 Units Subcutaneous TID WC   . sodium chloride Stopped (09/29/19 1638)  . ferumoxytol    . pantoprozole (PROTONIX) infusion 8 mg/hr (09/29/19 1436)    Allergies: No Known Allergies  Social History   Socioeconomic History  . Marital status: Single    Spouse name: Not on file  . Number of children: Not on file  . Years of education: Not on file  . Highest education level: Not on file  Occupational History  . Not on file  Tobacco Use  . Smoking status: Never Smoker  . Smokeless tobacco: Never Used  Substance and Sexual Activity  . Alcohol use: Yes    Alcohol/week: 2.0 standard drinks    Types: 2 Cans of beer per week    Comment: occ  . Drug use: No  . Sexual activity: Not on file  Other Topics Concern  . Not on file  Social History Narrative  . Not on file   Social Determinants of Health   Financial Resource Strain:   . Difficulty of Paying Living Expenses:   Food Insecurity:   .  Worried About Charity fundraiser in the Last Year:   . Arboriculturist in the Last Year:   Transportation Needs:   . Film/video editor (Medical):   Marland Kitchen Lack of Transportation (Non-Medical):   Physical Activity:   . Days of Exercise per Week:   . Minutes of Exercise per Session:   Stress:   . Feeling of Stress :   Social Connections:   . Frequency of Communication with Friends and Family:   . Frequency of Social Gatherings with Friends and Family:   . Attends Religious Services:   . Active Member of Clubs or Organizations:   . Attends Archivist Meetings:   Marland Kitchen Marital Status:    Intimate Partner Violence:   . Fear of Current or Ex-Partner:   . Emotionally Abused:   Marland Kitchen Physically Abused:   . Sexually Abused:      No family history on file.   Review of Systems Positive for shortness of breath weakness Negative for: General:  chills, fever, night sweats or weight changes.  Cardiovascular: PND orthopnea syncope dizziness  Dermatological skin lesions rashes Respiratory: Cough congestion Urologic: Frequent urination urination at night and hematuria Abdominal: negative for nausea, vomiting, diarrhea, bright red blood per rectum, melena, or hematemesis Neurologic: negative for visual changes, and/or hearing changes  All other systems reviewed and are otherwise negative except as noted above.  Labs: No results for input(s): CKTOTAL, CKMB, TROPONINI in the last 72 hours. Lab Results  Component Value Date   WBC 7.7 09/29/2019   HGB 3.7 (LL) 09/29/2019   HCT 14.6 (LL) 09/29/2019   MCV 67.9 (L) 09/29/2019   PLT 130 (L) 09/29/2019    Recent Labs  Lab 09/29/19 1126 09/29/19 1329  NA 138  --   K 4.0  --   CL 105  --   CO2 27  --   BUN 23  --   CREATININE 0.64  --   CALCIUM 8.5*  --   PROT  --  6.4*  BILITOT  --  0.8  ALKPHOS  --  56  ALT  --  22  AST  --  24  GLUCOSE 231*  --    No results found for: CHOL, HDL, LDLCALC, TRIG No results found for: DDIMER  Radiology/Studies:  No results found.  EKG: Normal sinus rhythm  Weights: Filed Weights   09/29/19 1122  Weight: 99.8 kg     Physical Exam: Blood pressure 106/65, pulse 89, temperature 98.7 F (37.1 C), temperature source Oral, resp. rate 18, height 5\' 4"  (1.626 m), weight 99.8 kg, SpO2 100 %. Body mass index is 37.76 kg/m. General: Well developed, well nourished, in no acute distress. Head eyes ears nose throat: Normocephalic, atraumatic, sclera non-icteric, no xanthomas, nares are without discharge. No apparent thyromegaly and/or mass  Lungs: Normal respiratory effort.  no wheezes, no  rales, no rhonchi.  Heart: RRR with normal S1 SOFT S2.  4+ aortic murmur gallop, no rub, PMI is normal size and placement, carotid upstroke normal without bruit, jugular venous pressure is normal Abdomen: Soft, non-tender, non-distended with normoactive bowel sounds. No hepatomegaly. No rebound/guarding. No obvious abdominal masses. Abdominal aorta is normal size without bruit Extremities: No edema. no cyanosis, no clubbing, no ulcers  Peripheral : 2+ bilateral upper extremity pulses, 2+ bilateral femoral pulses, 2+ bilateral dorsal pedal pulse Neuro: Alert and oriented. No facial asymmetry. No focal deficit. Moves all extremities spontaneously. Musculoskeletal: Normal muscle tone without kyphosis Psych:  Responds  to questions appropriately with a normal affect.    Assessment: 59 year old male with aortic and mitral valve replacement status post previous endocarditis with normal LV function by echocardiogram now with significant anemia possibly due to GI bleeding needing packed red blood cells and other treatment options  Plan: 1.  Supportive care of anemia with packed red blood cells following for improvements of symptoms 2.  Discontinuation of anticoagulation and or antiplatelet therapy until further evaluation of possible bleeding and reversal of anemia 3.  No additional medication management at this time due to lower blood pressure until reversal of anemia 4.  Further consideration echocardiogram to reassess aortic valve replacement with significant concerns of murmur at this time  Signed, Lamar Blinks M.D. [facility] Cardiology 09/29/2019, 5:02 PM

## 2019-09-29 NOTE — Consult Note (Addendum)
[facility] GI Inpatient Consult Note   Kathline Magic, M.D.  Reason for Consult: Symptomatic anemia   Attending Requesting Consult: Ivor Costa, M.D.   History of Present Illness: Joseph Raymond is a 59 y.o. 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  Patient speaks little English but is unable to understand the examiner; his primary language is [place].  Upon records review, patient underwent a colonoscopy in July 2020 revealing 2 adenomatous colon polyps, but was otherwise normal.  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 Patient presented today for apparent symptomatic changes with dyspnea and shortness of breath.  Patient has a history of aortic valve replacement for which he takes Coumadin.  He denies any complaints of overt gastrointestinal bleeding in the form of melena, hematemesis or hematochezia.  When queried, the patient denies any use of proton pump inhibitor, H2 blocker or NSAIDs.  INR upon presentation to the emergency room was 2.0.Hemoglobin was 3.7. Noted was cardiology consultation by Dr. Nehemiah Massed recommending holding of all anticoagulation until etiology of presumed gastrointestinal bleed could be identified and treated.  Peripheral blood evaluation revealed polychromasia, poikilocytosis and  no evidence of schistocytes and platelets appear to be normal.  Past Medical History:  Past Medical History:  Diagnosis Date  . Chronic back pain   . Diabetes mellitus without complication ([place])   . Lumbar radiculopathy     Problem List: Patient Active Problem List   Diagnosis Date Noted  . Symptomatic anemia 09/29/2019  . GIB (gastrointestinal bleeding) 09/29/2019  . H/O mechanical aortic valve replacement 09/29/2019  . HTN (hypertension) 09/29/2019  . HLD (hyperlipidemia) 09/29/2019  .  Type II diabetes mellitus with renal manifestations ([place]) 09/29/2019  . Iron deficiency anemia 09/29/2019    Past Surgical History: Past Surgical History:  Procedure Laterality Date  . KNEE SURGERY      Allergies: No Known Allergies  Home Medications: (Not in a hospital admission)  Home medication reconciliation was completed with the patient.   Scheduled Inpatient Medications:   . insulin aspart  0-5 Units Subcutaneous QHS  . insulin aspart  0-9 Units Subcutaneous TID WC    Continuous Inpatient Infusions:   . sodium chloride 75 mL/hr at 09/29/19 1717  . ferumoxytol    . pantoprozole (PROTONIX) infusion 8 mg/hr (09/29/19 1436)    PRN Inpatient Medications:  acetaminophen, hydrALAZINE, ondansetron (ZOFRAN) IV  Family History: family history includes Diabetes Mellitus II in his mother.   GI Family History: Negative  Social History:   reports that he has never smoked. He has never used smokeless tobacco. He reports current alcohol use of about 2.0 standard drinks of alcohol per week. He reports that he does not use drugs. The patient denies ETOH, tobacco, or drug use.    Review of Systems: Review of Systems - Negative except  HPI  Physical Examination: BP 114/67   Pulse 64   Temp 98.7 F (37.1 C) (Oral)   Resp 20   Ht 5\' 4"  (1.626 m)   Wt 99.8 kg   SpO2 100%   BMI 37.76 kg/m  Physical Exam Constitutional:      Appearance: Normal appearance. He is obese. He is not ill-appearing or diaphoretic.  HENT:     Head: Normocephalic and atraumatic.     Right Ear: External ear normal.     Left Ear:  External ear normal.     Nose: Nose normal.  Eyes:     Extraocular Movements: Extraocular movements intact.     Pupils: Pupils are equal, round, and reactive to light.  Cardiovascular:     Rate and Rhythm: Normal rate.     Heart sounds: Murmur present. No gallop.   Pulmonary:     Effort: Pulmonary effort is normal.     Breath sounds: Normal breath sounds.  Abdominal:      General: There is no distension.     Palpations: Abdomen is soft. There is no mass.     Tenderness: There is no abdominal tenderness. There is no guarding.     Hernia: No hernia is present.  Musculoskeletal:        General: Normal range of motion.     Cervical back: Normal range of motion.  Skin:    General: Skin is warm and dry.  Neurological:     General: No focal deficit present.     Mental Status: He is alert.  Psychiatric:        Mood and Affect: Mood normal.        Behavior: Behavior normal.        Thought Content: Thought content normal.     Data: Lab Results  Component Value Date   WBC 7.7 09/29/2019   HGB 3.7 (LL) 09/29/2019   HCT 14.6 (LL) 09/29/2019   MCV 67.9 (L) 09/29/2019   PLT 130 (L) 09/29/2019   Recent Labs  Lab 09/29/19 1126  HGB 3.7*   Lab Results  Component Value Date   NA 138 09/29/2019   K 4.0 09/29/2019   CL 105 09/29/2019   CO2 27 09/29/2019   BUN 23 09/29/2019   CREATININE 0.64 09/29/2019   Lab Results  Component Value Date   ALT 22 09/29/2019   AST 24 09/29/2019   ALKPHOS 56 09/29/2019   BILITOT 0.8 09/29/2019   Recent Labs  Lab 09/29/19 1126  INR 2.0*   CBC Latest Ref Rng & Units 09/29/2019 12/15/2018 12/07/2018  WBC 4.0 - 10.5 K/uL 7.7 10.4 9.4  Hemoglobin 13.0 - 17.0 g/dL 3.7(LL) 10.2(L) 10.3(L)  Hematocrit 39.0 - 52.0 % 14.6(LL) 32.7(L) 34.6(L)  Platelets 150 - 400 K/uL 130(L) 168 183    STUDIES: No results found. @IMAGES @  Assessment:  1. 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 Also consider PUD, other UGI AVM's. 2. Personal hx of adenomatous colon polyps - colonoscopy July 2020 3. Hx of UGI bleed with dieulafoy lesion - September 2020 [facility] - [facility] 4. Thrombocytopenia. 5. Aortic valvular replacement on Coumadin - held per hospitalist service.  COVID-19 status: Negative Test.   Recommendations:  1. Transfuse to Hgb > 7 2. IV Acid suppression. 3.  EGD electively when clinically feasible pending normalization of INR and Hgb. If overt bleeding ensues, will perform emergently. 4. Will follow patient's progress. Obtain interpreter for formal consent for procedures.   Thank you for the consult. Please call with questions or concerns.  Fara Boros, "Rosina Lowenstein MD [facility] [address] [place], [place] [phone]  09/29/2019 6:02 PM

## 2019-09-29 NOTE — H&P (View-Only) (Signed)
[facility] GI Inpatient Consult Note   Santonio Speakman Keith Spiro Ausborn, M.D.  Reason for Consult: Symptomatic anemia   Attending Requesting Consult: Xilin Niu, M.D.   History of Present Illness: Joseph Raymond is a 59 y.o. 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  Patient speaks little English but is unable to understand the examiner; his primary language is [place].  Upon records review, patient underwent a colonoscopy in July 2020 revealing 2 adenomatous colon polyps, but was otherwise normal.  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 Patient presented today for apparent symptomatic changes with dyspnea and shortness of breath.  Patient has a history of aortic valve replacement for which he takes Coumadin.  He denies any complaints of overt gastrointestinal bleeding in the form of melena, hematemesis or hematochezia.  When queried, the patient denies any use of proton pump inhibitor, H2 blocker or NSAIDs.  INR upon presentation to the emergency room was 2.0.Hemoglobin was 3.7. Noted was cardiology consultation by Dr. Kowalski recommending holding of all anticoagulation until etiology of presumed gastrointestinal bleed could be identified and treated.  Peripheral blood evaluation revealed polychromasia, poikilocytosis and  no evidence of schistocytes and platelets appear to be normal.  Past Medical History:  Past Medical History:  Diagnosis Date  . Chronic back pain   . Diabetes mellitus without complication ([facility])   . Lumbar radiculopathy     Problem List: Patient Active Problem List   Diagnosis Date Noted  . Symptomatic anemia 09/29/2019  . GIB (gastrointestinal bleeding) 09/29/2019  . H/O mechanical aortic valve replacement 09/29/2019  . HTN (hypertension) 09/29/2019  . HLD (hyperlipidemia) 09/29/2019  .  Type II diabetes mellitus with renal manifestations ([facility]) 09/29/2019  . Iron deficiency anemia 09/29/2019    Past Surgical History: Past Surgical History:  Procedure Laterality Date  . KNEE SURGERY      Allergies: No Known Allergies  Home Medications: (Not in a hospital admission)  Home medication reconciliation was completed with the patient.   Scheduled Inpatient Medications:   . insulin aspart  0-5 Units Subcutaneous QHS  . insulin aspart  0-9 Units Subcutaneous TID WC    Continuous Inpatient Infusions:   . sodium chloride 75 mL/hr at 09/29/19 1717  . ferumoxytol    . pantoprozole (PROTONIX) infusion 8 mg/hr (09/29/19 1436)    PRN Inpatient Medications:  acetaminophen, hydrALAZINE, ondansetron (ZOFRAN) IV  Family History: family history includes Diabetes Mellitus II in his mother.   GI Family History: Negative  Social History:   reports that he has never smoked. He has never used smokeless tobacco. He reports current alcohol use of about 2.0 standard drinks of alcohol per week. He reports that he does not use drugs. The patient denies ETOH, tobacco, or drug use.    Review of Systems: Review of Systems - Negative except  HPI  Physical Examination: BP 114/67   Pulse 64   Temp 98.7 F (37.1 C) (Oral)   Resp 20   Ht 5' 4" (1.626 m)   Wt 99.8 kg   SpO2 100%   BMI 37.76 kg/m  Physical Exam Constitutional:      Appearance: Normal appearance. He is obese. He is not ill-appearing or diaphoretic.  HENT:     Head: Normocephalic and atraumatic.     Right Ear: External ear normal.     Left Ear:   External ear normal.     Nose: Nose normal.  Eyes:     Extraocular Movements: Extraocular movements intact.     Pupils: Pupils are equal, round, and reactive to light.  Cardiovascular:     Rate and Rhythm: Normal rate.     Heart sounds: Murmur present. No gallop.   Pulmonary:     Effort: Pulmonary effort is normal.     Breath sounds: Normal breath sounds.  Abdominal:      General: There is no distension.     Palpations: Abdomen is soft. There is no mass.     Tenderness: There is no abdominal tenderness. There is no guarding.     Hernia: No hernia is present.  Musculoskeletal:        General: Normal range of motion.     Cervical back: Normal range of motion.  Skin:    General: Skin is warm and dry.  Neurological:     General: No focal deficit present.     Mental Status: He is alert.  Psychiatric:        Mood and Affect: Mood normal.        Behavior: Behavior normal.        Thought Content: Thought content normal.     Data: Lab Results  Component Value Date   WBC 7.7 09/29/2019   HGB 3.7 (LL) 09/29/2019   HCT 14.6 (LL) 09/29/2019   MCV 67.9 (L) 09/29/2019   PLT 130 (L) 09/29/2019   Recent Labs  Lab 09/29/19 1126  HGB 3.7*   Lab Results  Component Value Date   NA 138 09/29/2019   K 4.0 09/29/2019   CL 105 09/29/2019   CO2 27 09/29/2019   BUN 23 09/29/2019   CREATININE 0.64 09/29/2019   Lab Results  Component Value Date   ALT 22 09/29/2019   AST 24 09/29/2019   ALKPHOS 56 09/29/2019   BILITOT 0.8 09/29/2019   Recent Labs  Lab 09/29/19 1126  INR 2.0*   CBC Latest Ref Rng & Units 09/29/2019 12/15/2018 12/07/2018  WBC 4.0 - 10.5 K/uL 7.7 10.4 9.4  Hemoglobin 13.0 - 17.0 g/dL 3.7(LL) 10.2(L) 10.3(L)  Hematocrit 39.0 - 52.0 % 14.6(LL) 32.7(L) 34.6(L)  Platelets 150 - 400 K/uL 130(L) 168 183    STUDIES: No results found. @IMAGES @  Assessment:  1. 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 Also consider PUD, other UGI AVM's. 2. Personal hx of adenomatous colon polyps - colonoscopy July 2020 3. Hx of UGI bleed with dieulafoy lesion - September 2020 [facility] - Dr. [facility] 4. Thrombocytopenia. 5. Aortic valvular replacement on Coumadin - held per hospitalist service.  COVID-19 status: Negative Test.   Recommendations:  1. Transfuse to Hgb > 7 2. IV Acid suppression. 3.  EGD electively when clinically feasible pending normalization of INR and Hgb. If overt bleeding ensues, will perform emergently. 4. Will follow patient's progress. Obtain interpreter for formal consent for procedures.   Thank you for the consult. Please call with questions or concerns.  Fara Boros, "Rosina Lowenstein MD [facility] [address] [place], [place] [phone]  09/29/2019 6:02 PM

## 2019-09-30 ENCOUNTER — Inpatient Hospital Stay (HOSPITAL_COMMUNITY)
Admit: 2019-09-30 | Discharge: 2019-09-30 | Disposition: A | Discharge status: Home / Self Care | Payer: Medicare Other | Attending: Internal Medicine | Admitting: Internal Medicine

## 2019-09-30 DIAGNOSIS — I350 Nonrheumatic aortic (valve) stenosis: Secondary | ICD-10-CM

## 2019-09-30 LAB — CBC
HCT: 16.3 % — ABNORMAL LOW (ref 39.0–52.0)
HCT: 22.5 % — ABNORMAL LOW (ref 39.0–52.0)
HCT: 23.2 % — ABNORMAL LOW (ref 39.0–52.0)
HCT: 21.8 % — ABNORMAL LOW (ref 39.0–52.0)
Hemoglobin: 4.8 g/dL — CL (ref 13.0–17.0)
Hemoglobin: 6.5 g/dL — ABNORMAL LOW (ref 13.0–17.0)
Hemoglobin: 6.8 g/dL — ABNORMAL LOW (ref 13.0–17.0)
Hemoglobin: 6.6 g/dL — ABNORMAL LOW (ref 13.0–17.0)
MCH: 20.5 pg — ABNORMAL LOW (ref 26.0–34.0)
MCH: 21.6 pg — ABNORMAL LOW (ref 26.0–34.0)
MCH: 22.7 pg — ABNORMAL LOW (ref 26.0–34.0)
MCH: 22.8 pg — ABNORMAL LOW (ref 26.0–34.0)
MCHC: 29.4 g/dL — ABNORMAL LOW (ref 30.0–36.0)
MCHC: 28.9 g/dL — ABNORMAL LOW (ref 30.0–36.0)
MCHC: 29.3 g/dL — ABNORMAL LOW (ref 30.0–36.0)
MCHC: 30.3 g/dL (ref 30.0–36.0)
MCV: 69.7 fL — ABNORMAL LOW (ref 80.0–100.0)
MCV: 74.8 fL — ABNORMAL LOW (ref 80.0–100.0)
MCV: 77.6 fL — ABNORMAL LOW (ref 80.0–100.0)
MCV: 75.2 fL — ABNORMAL LOW (ref 80.0–100.0)
Platelets: 101 10*3/uL — ABNORMAL LOW (ref 150–400)
Platelets: 105 10*3/uL — ABNORMAL LOW (ref 150–400)
Platelets: 89 10*3/uL — ABNORMAL LOW (ref 150–400)
Platelets: 91 10*3/uL — ABNORMAL LOW (ref 150–400)
RBC: 2.34 MIL/uL — ABNORMAL LOW (ref 4.22–5.81)
RBC: 3.01 MIL/uL — ABNORMAL LOW (ref 4.22–5.81)
RBC: 2.99 MIL/uL — ABNORMAL LOW (ref 4.22–5.81)
RBC: 2.9 MIL/uL — ABNORMAL LOW (ref 4.22–5.81)
RDW: 20.2 % — ABNORMAL HIGH (ref 11.5–15.5)
RDW: 21.7 % — ABNORMAL HIGH (ref 11.5–15.5)
RDW: 21.8 % — ABNORMAL HIGH (ref 11.5–15.5)
RDW: 21.8 % — ABNORMAL HIGH (ref 11.5–15.5)
WBC: 7.5 10*3/uL (ref 4.0–10.5)
WBC: 7.8 10*3/uL (ref 4.0–10.5)
WBC: 7.5 10*3/uL (ref 4.0–10.5)
WBC: 7.6 10*3/uL (ref 4.0–10.5)
nRBC: 0.7 % — ABNORMAL HIGH (ref 0.0–0.2)
nRBC: 1.4 % — ABNORMAL HIGH (ref 0.0–0.2)
nRBC: 1.5 % — ABNORMAL HIGH (ref 0.0–0.2)
nRBC: 1.6 % — ABNORMAL HIGH (ref 0.0–0.2)

## 2019-09-30 LAB — BASIC METABOLIC PANEL
Anion gap: 5 (ref 5–15)
BUN: 18 mg/dL (ref 8–23)
CO2: 27 mmol/L (ref 22–32)
Calcium: 8.4 mg/dL — ABNORMAL LOW (ref 8.9–10.3)
Chloride: 109 mmol/L (ref 98–111)
Creatinine, Ser: 0.63 mg/dL (ref 0.61–1.24)
GFR calc Af Amer: >60 mL/min (ref >60)
GFR calc non Af Amer: >60 mL/min (ref >60)
Glucose, Bld: 153 mg/dL — ABNORMAL HIGH (ref 70–99)
Potassium: 4 mmol/L (ref 3.5–5.1)
Sodium: 141 mmol/L (ref 135–145)

## 2019-09-30 LAB — URINALYSIS, COMPLETE (UACMP) WITH MICROSCOPIC
Bacteria, UA: NONE SEEN
Bilirubin Urine: NEGATIVE
Glucose, UA: NEGATIVE mg/dL
Hgb urine dipstick: NEGATIVE
Ketones, ur: NEGATIVE mg/dL
Leukocytes,Ua: NEGATIVE
Nitrite: NEGATIVE
Protein, ur: NEGATIVE mg/dL
Specific Gravity, Urine: 1.019 (ref 1.005–1.030)
Squamous Epithelial / HPF: NONE SEEN (ref 0–5)
pH: 7 (ref 5.0–8.0)

## 2019-09-30 LAB — GLUCOSE, CAPILLARY
Glucose-Capillary: 113 mg/dL — ABNORMAL HIGH (ref 70–99)
Glucose-Capillary: 107 mg/dL — ABNORMAL HIGH (ref 70–99)
Glucose-Capillary: 140 mg/dL — ABNORMAL HIGH (ref 70–99)
Glucose-Capillary: 122 mg/dL — ABNORMAL HIGH (ref 70–99)

## 2019-09-30 LAB — PREPARE RBC (CROSSMATCH)

## 2019-09-30 LAB — PROTIME-INR
INR: 1.7 — ABNORMAL HIGH (ref 0.8–1.2)
Prothrombin Time: 19.1 seconds — ABNORMAL HIGH (ref 11.4–15.2)

## 2019-09-30 LAB — VITAMIN B12: Vitamin B-12: 292 pg/mL (ref 180–914)

## 2019-09-30 LAB — HAPTOGLOBIN: Haptoglobin: 72 mg/dL (ref 32–363)

## 2019-09-30 LAB — APTT: aPTT: 35 seconds (ref 24–36)

## 2019-09-30 LAB — HIV ANTIBODY (ROUTINE TESTING W REFLEX): HIV Screen 4th Generation wRfx: NONREACTIVE

## 2019-09-30 MED ORDER — FE FUMARATE-B12-VIT C-FA-IFC PO CAPS
1.0000 | ORAL_CAPSULE | Freq: Two times a day (BID) | ORAL | Status: DC
  Administered 2019-10-01 – 2019-10-02 (×3): 1 via ORAL
  Filled 2019-09-30 (×4): qty 1

## 2019-09-30 MED ORDER — CYANOCOBALAMIN 1000 MCG/ML IJ SOLN
1000.0000 ug | Freq: Once | INTRAMUSCULAR | Status: AC
  Administered 2019-09-30: 1000 ug via SUBCUTANEOUS
  Filled 2019-09-30: qty 1

## 2019-09-30 MED ORDER — SODIUM CHLORIDE 0.9% IV SOLUTION: Freq: Once | INTRAVENOUS | Status: AC

## 2019-09-30 MED ORDER — KETOROLAC TROMETHAMINE 15 MG/ML IJ SOLN: 15.0000 mg | Freq: Once | INTRAMUSCULAR | Status: DC

## 2019-09-30 MED ORDER — SODIUM CHLORIDE 0.9% IV SOLUTION: Freq: Once | INTRAVENOUS | Status: DC

## 2019-09-30 MED ORDER — SODIUM CHLORIDE 0.9 % IV SOLN: 510.0000 mg | Freq: Once | INTRAVENOUS | Status: DC

## 2019-09-30 NOTE — Progress Notes (Addendum)
[facility] Progress Note  Patient: Joseph Raymond    WUX:[id number]  DOA: 09/29/2019     Date of Service: the patient was seen and examined on 09/30/2019  Chief Complaint  Patient presents with  . Weakness  . 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  No mechanical valve.  Presents with anemia with hemoglobin of 3.9.  As before PRBC transfusion.  GI and cardiology following the patient. Currently further plan is continue supportive care and monitor results of the EGD tomorrow.  Assessment and Plan: 1.  Symptomatic anemia. Iron deficiency. Upper GI bleed with history of dielFoy lesion GI consulting. Currently on IV Protonix drip. IV as needed Zofran. Stopping anticoagulation. EGD tomorrow. Hemoglobin 6.8.  Will recheck at 9 PM before transfusion as concern is about volume overload. SP for PRBC transfusion. May require platelet transfusion if he requires another PRBC transfusion to avoid dilutional thrombocytopenia. SP IV iron, 1 dose of subcutaneous B12 as well as oral iron folic acid and O53 replacement starting tomorrow.  2.  History of endocarditis. 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 From [facility]. 12/04/16 Dr. Cherrie Distance 1. Aortic valve replacement with a 25 mm Inspiris bovine pericardial bioprosthetic valve. 2. Mitral valve replacement with a 29 mm Magna Mitral Ease bovine pericardial bioprosthetic valve. 3. Tricuspid valve annuloplasty with a 30 mm Carpentier Edwards [employer].  Patient is on anticoagulation with Coumadin. Patient was recently admitted in the hospital with life-threatening GI bleed. This is a second admission right now with a similar presentation. At present cardiology following. Anticoagulation is on hold. Recommend if there is no strong indication for anticoagulation  patient will benefit from discontinuing it going forward.  3.  Hypertension Hold blood pressure medication for now.  4.  Hyperlipidemia Continue Lipitor  5.  Diabetes mellitus with renal complication.  Controlled Continue sliding scale insulin. Hold Metformin.  6.  Obesity Body mass index is 39.36 kg/m.   Diet: Clear liquid diet, n.p.o. after midnight DVT Prophylaxis: SCD, pharmacological prophylaxis contraindicated due to GI bleed   Advance goals of care discussion: Full code  Family Communication: family was present at bedside, at the time of interview.  The pt provided permission to discuss medical plan with the family. Opportunity was given to ask question and all questions were answered satisfactorily.   Disposition:  Status is: Inpatient  Remains inpatient appropriate because:Ongoing diagnostic testing needed not appropriate for outpatient work up   Dispo: The patient is from: Home              Anticipated d/c is to: Home              Anticipated d/c date is: 2 days              Patient currently is not medically stable to d/c.         Subjective: No nausea no vomiting abdominal pain.  No fever no chills.  Interpreter was used for communication and examination.  Physical Exam: General:  alert oriented to time, place, and person.  Appear in mild distress, affect appropriate Eyes: PERRL ENT: Oral Mucosa Clear, moist  Neck: no JVD,  Cardiovascular: S1 and S2 Present, aortic systolic  Murmur,  Respiratory: good respiratory effort, Bilateral Air entry equal and Decreased, no Crackles, no wheezes Abdomen: Bowel Sound present, Soft and no tenderness,  Skin: no rash Extremities: trace Pedal  edema, no calf tenderness Neurologic: without any new focal findings Gait not checked due to patient safety concerns  Vitals:   09/30/19 1133 09/30/19 1235 09/30/19 1249 09/30/19 1538  BP: 121/71 124/81 124/72 127/81  Pulse: (!) 56 (!) 55 (!) 56 (!) 52  Resp: 18 18  18     Temp: 98 F (36.7 C) 98.6 F (37 C) 98.3 F (36.8 C) 98.6 F (37 C)  TempSrc: Oral Oral Oral Oral  SpO2: 99% 99% 99% 96%  Weight:      Height:        Intake/Output Summary (Last 24 hours) at 09/30/2019 1917 Last data filed at 09/30/2019 1600 Gross per 24 hour  Intake 2547.1 ml  Output 525 ml  Net 2022.1 ml   Filed Weights   09/29/19 1122 09/30/19 0411  Weight: 99.8 kg 104 kg    Data Reviewed: I have personally reviewed and interpreted daily labs, tele strips, imagings as discussed above. I reviewed all nursing notes, pharmacy notes, vitals, pertinent old records I have discussed plan of care as described above with RN and patient/family.  CBC: Recent Labs  Lab 09/29/19 1126 09/29/19 2028 09/30/19 0252 09/30/19 1123 09/30/19 1716  WBC 7.7 7.5 7.5 7.8 7.5  HGB 3.7* 4.3* 4.8* 6.5* 6.8*  HCT 14.6* 15.7* 16.3* 22.5* 23.2*  MCV 67.9* 69.5* 69.7* 74.8* 77.6*  PLT 130* 104* 101* 105* 89*   Basic Metabolic Panel: Recent Labs  Lab 09/29/19 1126 09/29/19 2028 09/30/19 0252  NA 138 141 141  K 4.0 3.9 4.0  CL 105 110 109  CO2 27 26 27   GLUCOSE 231* 131* 153*  BUN 23 20 18   CREATININE 0.64 0.61 0.63  CALCIUM 8.5* 8.2* 8.4*    Studies: No results found.  Scheduled Meds: . sodium chloride   Intravenous Once  . amoxicillin  500 mg Oral BID  . atorvastatin  40 mg Oral Daily  . [START ON 10/01/2019] ferrous fumarate-b12-vitamic C-folic acid  1 capsule Oral BID PC  . insulin aspart  0-5 Units Subcutaneous QHS  . insulin aspart  0-9 Units Subcutaneous TID WC   Continuous Infusions: . sodium chloride 75 mL/hr at 09/30/19 1728  . pantoprozole (PROTONIX) infusion 8 mg/hr (09/30/19 1916)   PRN Meds: acetaminophen, hydrALAZINE, ondansetron (ZOFRAN) IV, senna-docusate  Time spent: 35 minutes  Author: 10/03/2019, MD [facility] 09/30/2019 7:17 PM  To reach On-call, see care teams to locate the attending and reach out to them via [email or web address]. If 7PM-7AM,  please contact night-coverage If you still have difficulty reaching the attending provider, please page the [facility] (Director on Call) for [facility] on amion for assistance.

## 2019-09-30 NOTE — Progress Notes (Signed)
*  PRELIMINARY RESULTS* Echocardiogram 2D Echocardiogram has been performed.  Joseph Raymond 09/30/2019, 2:49 PM

## 2019-09-30 NOTE — Progress Notes (Signed)
[facility] Encounter Note  Patient: Deloyd Handy / Admit Date: 09/29/2019 / Date of Encounter: 09/30/2019, 12:58 PM   Subjective: Patient feels well today with no evidence of congestive heart failure or anginal symptoms despite to significantly low hemoglobin.  Overall clinically the patient has stable cardiovascular disease with normal LV function and aortic and mitral valve replacement by echocardiogram several months prior.  Review of Systems: Positive for: Weakness Negative for: Vision change, hearing change, syncope, dizziness, nausea, vomiting,diarrhea, bloody stool, stomach pain, cough, congestion, diaphoresis, urinary frequency, urinary pain,skin lesions, skin rashes Others previously listed  Objective: Telemetry: Normal sinus rhythm Physical Exam: Blood pressure 124/72, pulse (!) 56, temperature 98.3 F (36.8 C), temperature source Oral, resp. rate 18, height 5\' 4"  (1.626 m), weight 104 kg, SpO2 99 %. Body mass index is 39.36 kg/m. General: Well developed, well nourished, in no acute distress. Head: Normocephalic, atraumatic, sclera non-icteric, no xanthomas, nares are without discharge. Neck: No apparent masses Lungs: Normal respirations with no wheezes, no rhonchi, no rales , no crackles   Heart: Regular rate and rhythm, normal S1 SOFT S2, 3+ aortic murmur, no rub, no gallop, PMI is normal size and placement, carotid upstroke normal with bruit, jugular venous pressure normal Abdomen: Soft, non-tender, non-distended with normoactive bowel sounds. No hepatosplenomegaly. Abdominal aorta is normal size without bruit Extremities: Trace edema, no clubbing, no cyanosis, no ulcers,  Peripheral: 2+ radial, 2+ femoral, 2+ dorsal pedal pulses Neuro: Alert and oriented. Moves all extremities spontaneously. Psych:  Responds to questions appropriately with a normal affect.   Intake/Output Summary (Last 24 hours) at 09/30/2019 1258 Last data filed at 09/30/2019  1244 Gross per 24 hour  Intake 2814.77 ml  Output 325 ml  Net 2489.77 ml    Inpatient Medications:  . sodium chloride   Intravenous Once  . amoxicillin  500 mg Oral BID  . atorvastatin  40 mg Oral Daily  . cyanocobalamin  1,000 mcg Subcutaneous Once  . [START ON 10/01/2019] ferrous fumarate-b12-vitamic C-folic acid  1 capsule Oral BID PC  . insulin aspart  0-5 Units Subcutaneous QHS  . insulin aspart  0-9 Units Subcutaneous TID WC   Infusions:  . sodium chloride 75 mL/hr at 09/30/19 0445  . pantoprozole (PROTONIX) infusion 8 mg/hr (09/30/19 1014)    Labs: Recent Labs    09/29/19 2028 09/30/19 0252  NA 141 141  K 3.9 4.0  CL 110 109  CO2 26 27  GLUCOSE 131* 153*  BUN 20 18  CREATININE 0.61 0.63  CALCIUM 8.2* 8.4*   Recent Labs    09/29/19 1329 09/29/19 2028  AST 24 22  ALT 22 21  ALKPHOS 56 48  BILITOT 0.8 1.7*  PROT 6.4* 5.7*  ALBUMIN 3.6 3.2*   Recent Labs    09/30/19 0252 09/30/19 1123  WBC 7.5 7.8  HGB 4.8* 6.5*  HCT 16.3* 22.5*  MCV 69.7* 74.8*  PLT 101* 105*   No results for input(s): CKTOTAL, CKMB, TROPONINI in the last 72 hours. Invalid input(s): POCBNP No results for input(s): HGBA1C in the last 72 hours.   Weights: Filed Weights   09/29/19 1122 09/30/19 0411  Weight: 99.8 kg 104 kg     Radiology/Studies:  No results found.   Assessment and Recommendation  59 y.o. male with known hypertension hyperlipidemia and previously concerns of endocarditis with need in aortic and mitral bioprosthetic replacement having likely GI bleed from possible AVMs currently improving with treatment of packed red blood cells without evidence  of myocardial infarction or congestive heart failure currently at lowest risk possible for further intervention and assessment including upper endoscopy 1.  Continue supportive care of anemia including packed red blood cells without restriction 2.  Proceed to upper endoscopy for further evaluation and treatment of  possible GI bleed 3.  Avoid antiplatelets and/or Coumadin due to significant bleeding complications and no need for reinstatement at this time.  No current indication at this time requiring additional anticoagulation 4.  Antibiotics with endoscopy for risk reduction in endocarditis and prosthetic valves 5.  No further cardiac diagnostics necessary at this time 6.  Call if further questions  Signed, Serafina Royals M.D. FACC

## 2019-09-30 NOTE — Progress Notes (Signed)
GI Inpatient Follow-up Note  Subjective:  Patient seen in follow-up for symptomatic anemia. No acute events overnight. No new complaints. Hemoglobin 4.8 0300 this morning, Hemoglobin today 6.5. He is receiving 1 unit pRBCs this afternoon. He denies any nausea, vomiting, dysphagia, abdominal pain. He has had no further episodes of melena. He has been NPO since yesterday and requests something to eat if possible.   Scheduled Inpatient Medications:  . sodium chloride   Intravenous Once  . amoxicillin  500 mg Oral BID  . atorvastatin  40 mg Oral Daily  . cyanocobalamin  1,000 mcg Subcutaneous Once  . [START ON 10/01/2019] ferrous [id number] C-folic acid  1 capsule Oral BID PC  . insulin aspart  0-5 Units Subcutaneous QHS  . insulin aspart  0-9 Units Subcutaneous TID WC    Continuous Inpatient Infusions:   . sodium chloride 75 mL/hr at 09/30/19 0445  . pantoprozole (PROTONIX) infusion 8 mg/hr (09/30/19 1014)    PRN Inpatient Medications:  acetaminophen, hydrALAZINE, ondansetron (ZOFRAN) IV, senna-docusate  Review of Systems: Constitutional: Weight is stable.  Eyes: No changes in vision. ENT: No oral lesions, sore throat.  GI: see HPI.  Heme/Lymph: No easy bruising.  CV: No chest pain.  GU: No hematuria.  Integumentary: No rashes.  Neuro: No headaches.  Psych: No depression/anxiety.  Endocrine: No heat/cold intolerance.  Allergic/Immunologic: No urticaria.  Resp: No cough, SOB.  Musculoskeletal: No joint swelling.    Physical Examination: BP 124/72   Pulse (!) 56   Temp 98.3 F (36.8 C) (Oral)   Resp 18   Ht 5\' 4"  (1.626 m)   Wt 104 kg   SpO2 99%   BMI 39.36 kg/m  Obese male laying in hospital bed, interpretor tablet used and assists in all history questions Gen: NAD, alert and oriented x 4 HEENT: PEERLA, EOMI, Neck: supple, no JVD or thyromegaly Chest: CTA bilaterally, no wheezes, crackles, or other adventitious sounds CV: RRR, no m/g/c/r Abd: soft, NT,  ND, +BS in all four quadrants; no HSM, guarding, ridigity, or rebound tenderness Ext: no edema, well perfused with 2+ pulses, Skin: no rash or lesions noted Lymph: no LAD  Data: Lab Results  Component Value Date   WBC 7.8 09/30/2019   HGB 6.5 (L) 09/30/2019   HCT 22.5 (L) 09/30/2019   MCV 74.8 (L) 09/30/2019   PLT 105 (L) 09/30/2019   Recent Labs  Lab 09/29/19 2028 09/30/19 0252 09/30/19 1123  HGB 4.3* 4.8* 6.5*   Lab Results  Component Value Date   NA 141 09/30/2019   K 4.0 09/30/2019   CL 109 09/30/2019   CO2 27 09/30/2019   BUN 18 09/30/2019   CREATININE 0.63 09/30/2019   Lab Results  Component Value Date   ALT 21 09/29/2019   AST 22 09/29/2019   ALKPHOS 48 09/29/2019   BILITOT 1.7 (H) 09/29/2019   Recent Labs  Lab 09/30/19 0252  APTT 35  INR 1.7*   Assessment:  1. 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 Also consider PUD, other UGI AVM's.  2. Personal hx of adenomatous colon polyps - colonoscopy July 2020  3. Hx of UGI bleed with dieulafoy lesion - September 2020 [facility] - Dr. Loraine Maple.  4. Thrombocytopenia.  5. Aortic valvular replacement on Coumadin - held per hospitalist service.  COVID-19 status:        Negative Test.   Recommendations:  1. Agree with transfusions to ensure hgb >7.0 2. Continue acid suppression 3.  Clear liquids today 4. NPO after midnight 5. Plan for EGD tomorrow with Dr. Norma Fredrickson 6. See procedure note for findings and further recommendations  I reviewed the risks (including bleeding, perforation, infection, anesthesia complications, cardiac/respiratory complications), benefits and alternatives of EGD. Patient consents to proceed.    Please call with questions or concerns.    Jacob Moores, PA-C [facility] Gastroenterology [phone] (Cell)

## 2019-09-30 NOTE — Progress Notes (Signed)
Notified Hospitalist B. Jon Billings of hgb 6.6.  1 unit PBC ordered.

## 2019-10-01 ENCOUNTER — Inpatient Hospital Stay: Payer: Medicare Other | Admitting: Anesthesiology

## 2019-10-01 ENCOUNTER — Encounter
Admission: EM | Disposition: A | Discharge status: Home / Self Care | Payer: Self-pay | Source: Home / Self Care | Attending: Internal Medicine

## 2019-10-01 HISTORY — PX: ESOPHAGOGASTRODUODENOSCOPY: SHX5428

## 2019-10-01 LAB — CBC WITH DIFFERENTIAL/PLATELET
Abs Immature Granulocytes: 0.31 10*3/uL — ABNORMAL HIGH (ref 0.00–0.07)
Basophils Absolute: 0.1 10*3/uL (ref 0.0–0.1)
Basophils Relative: 1 %
Eosinophils Absolute: 0.4 10*3/uL (ref 0.0–0.5)
Eosinophils Relative: 4 %
HCT: 24.5 % — ABNORMAL LOW (ref 39.0–52.0)
Hemoglobin: 7.4 g/dL — ABNORMAL LOW (ref 13.0–17.0)
Immature Granulocytes: 4 %
Lymphocytes Relative: 15 %
Lymphs Abs: 1.3 10*3/uL (ref 0.7–4.0)
MCH: 23.6 pg — ABNORMAL LOW (ref 26.0–34.0)
MCHC: 30.2 g/dL (ref 30.0–36.0)
MCV: 78 fL — ABNORMAL LOW (ref 80.0–100.0)
Monocytes Absolute: 0.7 10*3/uL (ref 0.1–1.0)
Monocytes Relative: 8 %
Neutro Abs: 5.8 10*3/uL (ref 1.7–7.7)
Neutrophils Relative %: 68 %
Platelets: 85 10*3/uL — ABNORMAL LOW (ref 150–400)
RBC: 3.14 MIL/uL — ABNORMAL LOW (ref 4.22–5.81)
RDW: 22 % — ABNORMAL HIGH (ref 11.5–15.5)
Smear Review: DECREASED
WBC: 8.5 10*3/uL (ref 4.0–10.5)
nRBC: 1.2 % — ABNORMAL HIGH (ref 0.0–0.2)

## 2019-10-01 LAB — ECHOCARDIOGRAM COMPLETE
Height: 64 in
Weight: 3668.8 oz

## 2019-10-01 LAB — GLUCOSE, CAPILLARY
Glucose-Capillary: 116 mg/dL — ABNORMAL HIGH (ref 70–99)
Glucose-Capillary: 128 mg/dL — ABNORMAL HIGH (ref 70–99)
Glucose-Capillary: 164 mg/dL — ABNORMAL HIGH (ref 70–99)

## 2019-10-01 LAB — HEMOGLOBIN A1C
Hgb A1c MFr Bld: 6.2 % — ABNORMAL HIGH (ref 4.8–5.6)
Mean Plasma Glucose: 131 mg/dL

## 2019-10-01 SURGERY — EGD (ESOPHAGOGASTRODUODENOSCOPY)
Anesthesia: General

## 2019-10-01 MED ORDER — SENNOSIDES-DOCUSATE SODIUM 8.6-50 MG PO TABS
1.0000 | ORAL_TABLET | Freq: Two times a day (BID) | ORAL | Status: DC
  Administered 2019-10-01 – 2019-10-02 (×2): 1 via ORAL
  Filled 2019-10-01 (×2): qty 1

## 2019-10-01 MED ORDER — HYDROCERIN EX CREA
TOPICAL_CREAM | Freq: Two times a day (BID) | CUTANEOUS | Status: DC
  Filled 2019-10-01 (×2): qty 113

## 2019-10-01 MED ORDER — HYDROCORTISONE 0.5 % EX CREA
TOPICAL_CREAM | Freq: Two times a day (BID) | CUTANEOUS | Status: DC
  Filled 2019-10-01 (×2): qty 28.35

## 2019-10-01 MED ORDER — PROPOFOL 500 MG/50ML IV EMUL
INTRAVENOUS | Status: DC | PRN
  Administered 2019-10-01: 140 ug/kg/min via INTRAVENOUS

## 2019-10-01 MED ORDER — SODIUM CHLORIDE 0.9 % IV SOLN
INTRAVENOUS | Status: DC
  Administered 2019-10-01: 1000 mL via INTRAVENOUS

## 2019-10-01 MED ORDER — LIDOCAINE HCL (CARDIAC) PF 100 MG/5ML IV SOSY
PREFILLED_SYRINGE | INTRAVENOUS | Status: DC | PRN
  Administered 2019-10-01: 100 mg via INTRAVENOUS

## 2019-10-01 MED ORDER — IPRATROPIUM-ALBUTEROL 0.5-2.5 (3) MG/3ML IN SOLN
RESPIRATORY_TRACT | Status: AC
  Filled 2019-10-01: qty 3

## 2019-10-01 MED ORDER — PROPOFOL 10 MG/ML IV BOLUS
INTRAVENOUS | Status: DC | PRN
  Administered 2019-10-01: 70 mg via INTRAVENOUS

## 2019-10-01 MED ORDER — GLYCOPYRROLATE 0.2 MG/ML IJ SOLN
INTRAMUSCULAR | Status: DC | PRN
  Administered 2019-10-01: .2 mg via INTRAVENOUS

## 2019-10-01 NOTE — Progress Notes (Signed)
[facility] Progress Note  Patient: Joseph Raymond    YQM:[id number]  DOA: 09/29/2019     Date of Service: the patient was seen and examined on 10/01/2019  Chief Complaint  Patient presents with  . Weakness  . 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  No mechanical valve.  Presents with anemia with hemoglobin of 3.9.  As before PRBC transfusion.  GI and cardiology following the patient.  Underwent EGD on 10/01/2019 with AVM treated with cautery. Currently further plan is continue supportive care.  Assessment and Plan: 1.  Symptomatic anemia. Iron deficiency. Upper GI bleed with history of dielFoy lesion Acute thrombocytopenia dilutional in nature GI consulting. Currently on IV Protonix drip. IV as needed Zofran. Stopping anticoagulation. 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 Hemoglobin stable after transfusion. SP for PRBC transfusion. Will require platelet transfusion if he requires another PRBC transfusion for his dilutional thrombocytopenia. SP IV iron, 1 dose of subcutaneous B12 as well as oral iron folic acid and B28 replacement  2.  History of endocarditis. 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 From Care Everywhere. 12/04/16 Dr. Cherrie Distance 1. Aortic valve replacement with a 25 mm Inspiris bovine pericardial bioprosthetic valve. 2. Mitral valve replacement with a 29 mm Magna Mitral Ease bovine pericardial bioprosthetic valve. 3. Tricuspid valve annuloplasty with a 30 mm Carpentier Edwards [employer].  Patient is on anticoagulation with Coumadin. Patient was recently admitted in the hospital with life-threatening GI bleed. This is a second admission right now with a similar presentation. At present cardiology following. Anticoagulation is on hold. Recommend if there is no  strong indication for anticoagulation patient will benefit from discontinuing it going forward. Awaiting cardiology recommendation.  3.  Hypertension Hold blood pressure medication for now.  4.  Hyperlipidemia Continue Lipitor  5.  Diabetes mellitus with renal complication.  Controlled Continue sliding scale insulin. Hold Metformin.  6.  Obesity Body mass index is 39.2 kg/m.   7.  Right flank itching with erythema. Topical hydrocortisone and Eucerin cream.  Diet: Clear liquid diet DVT Prophylaxis: SCD, pharmacological prophylaxis contraindicated due to GI bleed   Advance goals of care discussion: Full code  Family Communication: family was present at bedside, at the time of interview.  The pt provided permission to discuss medical plan with the family. Opportunity was given to ask question and all questions were answered satisfactorily.   Disposition:  Status is: Inpatient  Remains inpatient appropriate because:Ongoing diagnostic testing needed not appropriate for outpatient work up still at risk for GI bleed.  Currently on clear liquid diet.  Need close observation and further discussion regarding resumption of anticoagulation.   Dispo: The patient is from: Home              Anticipated d/c is to: Home              Anticipated d/c date is: 2 days              Patient currently is not medically stable to d/c.  Subjective: Reports itching on the right flank.  No nausea no vomiting.  No fever no chills.  No BM so far.  Physical Exam: General:  alert oriented to time, place, and person.  Appear in mild distress, affect appropriate Eyes: PERRL ENT: Oral Mucosa Clear, moist  Neck: no JVD,  Cardiovascular: S1 and S2 Present,  aortic systolic  Murmur,  Respiratory: good respiratory effort, Bilateral Air entry equal and Decreased, no Crackles, no wheezes Abdomen: Bowel Sound present, Soft and no tenderness,  Skin: Erythema on right flank, no rash Extremities: trace Pedal  edema, no calf tenderness Neurologic: without any new focal findings Gait not checked due to patient safety concerns  Vitals:   10/01/19 1320 10/01/19 1330 10/01/19 1340 10/01/19 1408  BP: 122/83 124/84 117/75 (!) 88/52  Pulse: 67 65 67 61  Resp: 19 18 14 14   Temp:    98.2 F (36.8 C)  TempSrc:    Oral  SpO2: 100% 100% 95% 96%  Weight:      Height:        Intake/Output Summary (Last 24 hours) at 10/01/2019 1723 Last data filed at 10/01/2019 1500 Gross per 24 hour  Intake 1562.67 ml  Output 1900 ml  Net -337.33 ml   Filed Weights   09/29/19 1122 09/30/19 0411 10/01/19 0457  Weight: 99.8 kg 104 kg 103.6 kg    Data Reviewed: I have personally reviewed and interpreted daily labs, tele strips, imagings as discussed above. I reviewed all nursing notes, pharmacy notes, vitals, pertinent old records I have discussed plan of care as described above with RN and patient/family.  CBC: Recent Labs  Lab 09/30/19 0252 09/30/19 1123 09/30/19 1716 09/30/19 2049 10/01/19 0557  WBC 7.5 7.8 7.5 7.6 8.5  NEUTROABS  --   --   --   --  5.8  HGB 4.8* 6.5* 6.8* 6.6* 7.4*  HCT 16.3* 22.5* 23.2* 21.8* 24.5*  MCV 69.7* 74.8* 77.6* 75.2* 78.0*  PLT 101* 105* 89* 91* 85*   Basic Metabolic Panel: Recent Labs  Lab 09/29/19 1126 09/29/19 2028 09/30/19 0252  NA 138 141 141  K 4.0 3.9 4.0  CL 105 110 109  CO2 27 26 27   GLUCOSE 231* 131* 153*  BUN 23 20 18   CREATININE 0.64 0.61 0.63  CALCIUM 8.5* 8.2* 8.4*    Studies: No results found.  Scheduled Meds: . sodium chloride   Intravenous Once  . amoxicillin  500 mg Oral BID  . atorvastatin  40 mg Oral Daily  . ferrous fumarate-b12-vitamic C-folic acid  1 capsule Oral BID PC  . hydrocerin   Topical BID  . hydrocortisone cream   Topical BID  . insulin aspart  0-5 Units Subcutaneous QHS  . insulin aspart  0-9 Units Subcutaneous TID WC  . ipratropium-albuterol      . ipratropium-albuterol       Continuous Infusions: . pantoprozole  (PROTONIX) infusion 8 mg/hr (10/01/19 0434)   PRN Meds: acetaminophen, hydrALAZINE, ondansetron (ZOFRAN) IV, senna-docusate  Time spent: 35 minutes  Author: , MD [facility] 10/01/2019 5:23 PM  To reach On-call, see care teams to locate the attending and reach out to them via [email or web address]. If 7PM-7AM, please contact night-coverage If you still have difficulty reaching the attending provider, please page the [facility] (Director on Call) for [facility] on amion for assistance.

## 2019-10-01 NOTE — Anesthesia Preprocedure Evaluation (Signed)
Anesthesia Evaluation  Patient identified by MRN, date of birth, ID band Patient awake    Reviewed: Allergy & Precautions, H&P , NPO status , Patient's Chart, lab work & pertinent test results, reviewed documented beta blocker date and time   Airway Mallampati: II   Neck ROM: full    Dental  (+) Poor Dentition   Pulmonary neg pulmonary ROS,    Pulmonary exam normal        Cardiovascular Exercise Tolerance: Good hypertension, Normal cardiovascular exam Rhythm:regular Rate:Normal     Neuro/Psych  Neuromuscular disease negative psych ROS   GI/Hepatic negative GI ROS, Neg liver ROS,   Endo/Other  negative endocrine ROSdiabetes  Renal/GU Renal disease  negative genitourinary   Musculoskeletal   Abdominal   Peds  Hematology  (+) Blood dyscrasia, anemia ,   Anesthesia Other Findings Past Medical History: No date: Chronic back pain No date: Diabetes mellitus without complication (HCC) No date: Lumbar radiculopathy Past Surgical History: No date: KNEE SURGERY BMI    Body Mass Index: 39.20 kg/m     Reproductive/Obstetrics negative OB ROS                             Anesthesia Physical Anesthesia Plan  ASA: III  Anesthesia Plan: General   Post-op Pain Management:    Induction:   PONV Risk Score and Plan:   Airway Management Planned:   Additional Equipment:   Intra-op Plan:   Post-operative Plan:   Informed Consent: I have reviewed the patients History and Physical, chart, labs and discussed the procedure including the risks, benefits and alternatives for the proposed anesthesia with the patient or authorized representative who has indicated his/her understanding and acceptance.     Dental Advisory Given  Plan Discussed with: CRNA  Anesthesia Plan Comments:         Anesthesia Quick Evaluation

## 2019-10-01 NOTE — Interval H&P Note (Signed)
History and Physical Interval Note:  10/01/2019 12:19 PM  Joseph Raymond  has presented today for surgery, with the diagnosis of Melena, symptomatic anemia.  The various methods of treatment have been discussed with the patient and family. After consideration of risks, benefits and other options for treatment, the patient has consented to  Procedure(s): ESOPHAGOGASTRODUODENOSCOPY (EGD) (N/A) as a surgical intervention.  The patient's history has been reviewed, patient examined, no change in status, stable for surgery.  I have reviewed the patient's chart and labs.  Questions were answered to the patient's satisfaction.     [place], [place]

## 2019-10-01 NOTE — Op Note (Signed)
[facility] Gastroenterology Patient Name: Joseph Raymond Procedure Date: 10/01/2019 11:57 AM MRN: [id number] Account #: [id number] Date of Birth: 08-18-1960 Admit Type: Inpatient Age: 59 Room: [facility] ENDO ROOM 4 Gender: Male Note Status: Finalized Procedure:             Upper GI endoscopy Indications:           Treatment of bleeding vascular abnormality in the                         stomach, Acute post hemorrhagic anemia, Melena Providers:             Boykin Nearing. Shadonna Benedick MD, MD Medicines:             Propofol per Anesthesia Complications:         No immediate complications. Estimated blood loss:                         Minimal. Procedure:             Pre-Anesthesia Assessment:                        - The risks and benefits of the procedure and the                         sedation options and risks were discussed with the                         patient. All questions were answered and informed                         consent was obtained.                        - Patient identification and proposed procedure were                         verified prior to the procedure by the nurse. The                         procedure was verified in the procedure room.                        - ASA Grade Assessment: III - A patient with severe                         systemic disease.                        - After reviewing the risks and benefits, the patient                         was deemed in satisfactory condition to undergo the                         procedure.                        After obtaining informed consent, the endoscope was  passed under direct vision. Throughout the procedure,                         the patient's blood pressure, pulse, and oxygen                         saturations were monitored continuously. The Endoscope                         was introduced through the mouth, and advanced to the                         third  part of duodenum. The upper GI endoscopy was                         accomplished without difficulty. The patient tolerated                         the procedure well. Findings:      The examined esophagus was normal.      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 Coagulation for hemostasis using bipolar probe was successful.       Estimated blood loss was minimal. Estimated blood loss was minimal.      A 1 cm hiatal hernia was present.      The examined duodenum was normal.      The exam was otherwise without abnormality. Impression:            - Normal esophagus.                        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                         Treated with bipolar cautery.                        - 1 cm hiatal hernia.                        - Normal examined duodenum.                        - The examination was otherwise normal.                        - No specimens collected. Recommendation:        - Return patient to hospital ward for ongoing care.                        - Clear liquid diet. Procedure Code(s):     --- Professional ---                        [phone], Esophagogastroduodenoscopy, flexible,                         transoral; with control of bleeding, any method Diagnosis Code(s):     --- Professional ---  K92.1, Melena (includes Hematochezia)                        D62, Acute posthemorrhagic anemia                        K92.2, Gastrointestinal hemorrhage, unspecified                        K44.9, Diaphragmatic hernia without obstruction or                         gangrene                        K31.811, Angiodysplasia of stomach and duodenum with                         bleeding CPT copyright 2019 American Medical Association. All rights reserved. The codes documented in this report are preliminary and upon coder review may  be revised to meet current compliance requirements. Efrain Sella MD, MD 10/01/2019 12:58:58 PM This report has been signed electronically. Number of Addenda: 0 Note Initiated On: 10/01/2019 11:57 AM Estimated Blood Loss:  Estimated blood loss was minimal.      [facility]

## 2019-10-01 NOTE — Care Management Important Message (Signed)
Important Message  Patient Details  Name: Joseph Raymond MRN: [id number] Date of Birth: [birth year]   Medicare Important Message Given:  Yes  Initial Medicare IM given by Patient Access Associate on 09/30/2019 at 2:12pm.     Johnell Comings 10/01/2019, 8:19 AM

## 2019-10-01 NOTE — Transfer of Care (Signed)
Immediate Anesthesia Transfer of Care Note  Patient: Joseph Raymond  Procedure(s) Performed: ESOPHAGOGASTRODUODENOSCOPY (EGD) (N/A )  Patient Location: PACU  Anesthesia Type:General  Level of Consciousness: awake and alert   Airway & Oxygen Therapy: Patient Spontanous Breathing and Patient connected to face mask  Post-op Assessment: Report given to RN and Post -op Vital signs reviewed and stable  Post vital signs: Reviewed and stable  Last Vitals:  Vitals Value Taken Time  BP 122/81 10/01/19 1310  Temp 36.8 C 10/01/19 1310  Pulse 88 10/01/19 1310  Resp 20 10/01/19 1310  SpO2 99 % 10/01/19 1310    Last Pain:  Vitals:   10/01/19 1310  TempSrc: Temporal  PainSc: Asleep         Complications: respiratory complications, see intraprocedure notes

## 2019-10-02 LAB — CBC
HCT: 24.3 % — ABNORMAL LOW (ref 39.0–52.0)
HCT: 24.6 % — ABNORMAL LOW (ref 39.0–52.0)
Hemoglobin: 7.5 g/dL — ABNORMAL LOW (ref 13.0–17.0)
Hemoglobin: 7.5 g/dL — ABNORMAL LOW (ref 13.0–17.0)
MCH: 23.7 pg — ABNORMAL LOW (ref 26.0–34.0)
MCH: 24 pg — ABNORMAL LOW (ref 26.0–34.0)
MCHC: 30.5 g/dL (ref 30.0–36.0)
MCHC: 30.9 g/dL (ref 30.0–36.0)
MCV: 77.8 fL — ABNORMAL LOW (ref 80.0–100.0)
MCV: 77.9 fL — ABNORMAL LOW (ref 80.0–100.0)
Platelets: 90 10*3/uL — ABNORMAL LOW (ref 150–400)
Platelets: 93 10*3/uL — ABNORMAL LOW (ref 150–400)
RBC: 3.12 MIL/uL — ABNORMAL LOW (ref 4.22–5.81)
RBC: 3.16 MIL/uL — ABNORMAL LOW (ref 4.22–5.81)
RDW: 23.2 % — ABNORMAL HIGH (ref 11.5–15.5)
RDW: 23.9 % — ABNORMAL HIGH (ref 11.5–15.5)
WBC: 10.7 10*3/uL — ABNORMAL HIGH (ref 4.0–10.5)
WBC: 10.7 10*3/uL — ABNORMAL HIGH (ref 4.0–10.5)
nRBC: 0.9 % — ABNORMAL HIGH (ref 0.0–0.2)
nRBC: 1.7 % — ABNORMAL HIGH (ref 0.0–0.2)

## 2019-10-02 LAB — TYPE AND SCREEN
ABO/RH(D): O POS
Antibody Screen: POSITIVE
Donor AG Type: NEGATIVE
Donor AG Type: NEGATIVE
Donor AG Type: NEGATIVE
Donor AG Type: NEGATIVE
Donor AG Type: NEGATIVE
PT AG Type: NEGATIVE
Unit division: 0
Unit division: 0
Unit division: 0
Unit division: 0
Unit division: 0
Unit division: 0
Unit division: 0

## 2019-10-02 LAB — BPAM RBC
Blood Product Expiration Date: 202105222359
Blood Product Expiration Date: 202105242359
Blood Product Expiration Date: 202106212359
Blood Product Expiration Date: 202106222359
Blood Product Expiration Date: 202106222359
Blood Product Expiration Date: 202106232359
Blood Product Expiration Date: 202106242359
ISSUE DATE / TIME: 202105191629
ISSUE DATE / TIME: 202105192157
ISSUE DATE / TIME: 202105200404
ISSUE DATE / TIME: 202105201222
ISSUE DATE / TIME: 202105202234
Unit Type and Rh: 5100
Unit Type and Rh: 5100
Unit Type and Rh: 5100
Unit Type and Rh: 5100
Unit Type and Rh: 5100
Unit Type and Rh: 5100
Unit Type and Rh: 5100

## 2019-10-02 LAB — GLUCOSE, CAPILLARY
Glucose-Capillary: 111 mg/dL — ABNORMAL HIGH (ref 70–99)
Glucose-Capillary: 130 mg/dL — ABNORMAL HIGH (ref 70–99)

## 2019-10-02 MED ORDER — PANTOPRAZOLE SODIUM 40 MG PO TBEC: 40.0000 mg | DELAYED_RELEASE_TABLET | Freq: Two times a day (BID) | ORAL | Status: DC

## 2019-10-02 MED ORDER — PANTOPRAZOLE SODIUM 40 MG PO TBEC: 40.0000 mg | DELAYED_RELEASE_TABLET | Freq: Two times a day (BID) | ORAL | 0 refills | Status: DC

## 2019-10-02 MED ORDER — POLYETHYLENE GLYCOL 3350 17 G PO PACK
17.0000 g | PACK | Freq: Every day | ORAL | Status: DC
  Administered 2019-10-02: 17 g via ORAL
  Filled 2019-10-02: qty 1

## 2019-10-02 MED ORDER — POLYETHYLENE GLYCOL 3350 17 G PO PACK: 17.0000 g | PACK | Freq: Every day | ORAL | 0 refills | Status: DC

## 2019-10-02 MED ORDER — FE FUMARATE-B12-VIT C-FA-IFC PO CAPS: 1.0000 | ORAL_CAPSULE | Freq: Two times a day (BID) | ORAL | 0 refills | Status: DC

## 2019-10-02 MED ORDER — IPRATROPIUM-ALBUTEROL 0.5-2.5 (3) MG/3ML IN SOLN
3.0000 mL | Freq: Once | RESPIRATORY_TRACT | Status: AC
  Administered 2019-10-02: 3 mL via RESPIRATORY_TRACT
  Filled 2019-10-02: qty 3

## 2019-10-02 NOTE — Plan of Care (Signed)
  Problem: Education: Goal: Knowledge of General Education information will improve Description: Including pain rating scale, medication(s)/side effects and non-pharmacologic comfort measures Outcome: Progressing   Problem: Health Behavior/Discharge Planning: Goal: Ability to manage health-related needs will improve Outcome: Progressing Note: Patient's hemoglobin has remained low, but stable, at only 7.5. Thus, no transfusion needed today. May possibly be discharged later this afternoon. Will continue to monitor lab values for the remainder of the shift. Jari Favre [facility]

## 2019-10-02 NOTE — Progress Notes (Signed)
GI Inpatient Follow-up Note  Subjective:  Patient seen in follow-up for symptomatic anemia. He had EGD performed yesterday by Dr. Norma Fredrickson showing multiple 3-6 mm bleeding AVMs in stomach s/p treatment with APC, normal esophagus and duodenum. Hemoglobin 7.5 on morning labs. No BM in over 24 hours. No recurrent bleeding episodes. He denies any current symptoms of nausea, vomiting, abdominal pain. He has no complaints. He tolerated clear liquid diet with no complaints.   Scheduled Inpatient Medications:  . sodium chloride   Intravenous Once  . amoxicillin  500 mg Oral BID  . atorvastatin  40 mg Oral Daily  . ferrous fumarate-b12-vitamic C-folic acid  1 capsule Oral BID PC  . hydrocerin   Topical BID  . hydrocortisone cream   Topical BID  . insulin aspart  0-5 Units Subcutaneous QHS  . insulin aspart  0-9 Units Subcutaneous TID WC  . pantoprazole  40 mg Oral BID AC  . polyethylene glycol  17 g Oral Daily  . senna-docusate  1 tablet Oral BID    Continuous Inpatient Infusions:   . pantoprozole (PROTONIX) infusion 8 mg/hr (10/02/19 0448)    PRN Inpatient Medications:  acetaminophen, hydrALAZINE, ondansetron (ZOFRAN) IV  Review of Systems: Constitutional: Weight is stable.  Eyes: No changes in vision. ENT: No oral lesions, sore throat.  GI: see HPI.  Heme/Lymph: No easy bruising.  CV: No chest pain.  GU: No hematuria.  Integumentary: No rashes.  Neuro: No headaches.  Psych: No depression/anxiety.  Endocrine: No heat/cold intolerance.  Allergic/Immunologic: No urticaria.  Resp: No cough, SOB.  Musculoskeletal: No joint swelling.    Physical Examination: BP (!) 107/47 (BP Location: Left Arm)   Pulse 60   Temp 98.6 F (37 C) (Oral)   Resp 18   Ht 5\' 4"  (1.626 m)   Wt 101.7 kg   SpO2 96%   BMI 38.47 kg/m  Pleasant, non-English speaking male laying in hospital bed. Translator tablet used. Gen: NAD, alert and oriented x 4 HEENT: PEERLA, EOMI, Neck: supple, no JVD or  thyromegaly Chest: CTA bilaterally, no wheezes, crackles, or other adventitious sounds CV: RRR, no m/g/c/r Abd: soft, NT, ND, +BS in all four quadrants; no HSM, guarding, ridigity, or rebound tenderness Ext: no edema, well perfused with 2+ pulses, Skin: no rash or lesions noted Lymph: no LAD  Data: Lab Results  Component Value Date   WBC 10.7 (H) 10/02/2019   HGB 7.5 (L) 10/02/2019   HCT 24.6 (L) 10/02/2019   MCV 77.8 (L) 10/02/2019   PLT 90 (L) 10/02/2019   Recent Labs  Lab 10/01/19 0557 10/02/19 0026 10/02/19 0656  HGB 7.4* 7.5* 7.5*   Lab Results  Component Value Date   NA 141 09/30/2019   K 4.0 09/30/2019   CL 109 09/30/2019   CO2 27 09/30/2019   BUN 18 09/30/2019   CREATININE 0.63 09/30/2019   Lab Results  Component Value Date   ALT 21 09/29/2019   AST 22 09/29/2019   ALKPHOS 48 09/29/2019   BILITOT 1.7 (H) 09/29/2019   Recent Labs  Lab 09/30/19 0252  APTT 35  INR 1.7*   Assessment/Plan:  1. Anemia secondary to gastrointestinal blood loss - 2/2 bleeding AVMs in stomach s/p treatment with APC yesterday by Dr. 10/02/19  2. Personal hx of adenomatous colon polyps - colonoscopy July 2020  3. Hx of UGI bleed with dieulafoy lesion - September 2020 [facility] - [facility]  4. Thrombocytopenia.  5. Aortic valvular replacement on Coumadin -  held per hospitalist service.   Recommendations:  1. No recurrent bleeding 2. Advance diet as tolerated 3. Transition to oral Protonix 40 mg twice daily 4. Anticipate discharge today with outpatient GI follow-up  5. Avoid NSAIDs 6. Agree with holding anticoagulation due to multiple bleeding events requiring multiple hospitalizations  Please call with questions or concerns.    Octavia Bruckner, PA-C [facility] Gastroenterology [phone] (Cell)

## 2019-10-02 NOTE — Discharge Instructions (Signed)
Weakness Weakness is a lack of strength. You may feel weak all over your body (generalized), or you may feel weak in one specific part of your body (focal). Common causes of weakness include:  Infection and immune system disorders.  Physical exhaustion.  Internal bleeding or other blood loss that results in a lack of red blood cells (anemia).  Dehydration.  An imbalance in mineral (electrolyte) levels, such as potassium.  Heart disease, circulation problems, or stroke. Other causes include:  Some medicines or cancer treatment.  Stress, anxiety, or depression.  Nervous system disorders.  Thyroid disorders.  Loss of muscle strength because of age or inactivity.  Poor sleep quality or sleep disorders. The cause of your weakness may not be known. Some causes of weakness can be serious, so it is important to see your health care provider. Follow these instructions at home: Activity  Rest as needed.  Try to get enough sleep. Most adults need 7-8 hours of quality sleep each night. Talk to your health care provider about how much sleep you need each night.  Do exercises, such as arm curls and leg raises, for 30 minutes at least 2 days a week or as told by your health care provider. This helps build muscle strength.  Consider working with a physical therapist or trainer who can develop an exercise plan to help you gain muscle strength. General instructions   Take over-the-counter and prescription medicines only as told by your health care provider.  Eat a healthy, well-balanced diet. This includes: ? Proteins to build muscles, such as lean meats and fish. ? Fresh fruits and vegetables. ? Carbohydrates to boost energy, such as whole grains.  Drink enough fluid to keep your urine pale yellow.  Keep all follow-up visits as told by your health care provider. This is important. Contact a health care provider if your weakness:  Does not improve or gets worse.  Affects your  ability to think clearly.  Affects your ability to do your normal daily activities. Get help right away if you:  Develop sudden weakness, especially on one side of your face or body.  Have chest pain.  Have trouble breathing or shortness of breath.  Have problems with your vision.  Have trouble talking or swallowing.  Have trouble standing or walking.  Are light-headed or lose consciousness. Summary  Weakness is a lack of strength. You may feel weak all over your body or just in one specific part of your body.  Weakness can be caused by a variety of things. In some cases, the cause may be unknown.  Rest as needed, and try to get enough sleep. Most adults need 7-8 hours of quality sleep each night.  Eat a healthy, well-balanced diet. This information is not intended to replace advice given to you by your health care provider. Make sure you discuss any questions you have with your health care provider. Document Revised: 12/03/2017 Document Reviewed: 12/03/2017 Elsevier Patient Education  2020 Elsevier Inc.  

## 2019-10-04 ENCOUNTER — Encounter: Payer: Self-pay | Admitting: *Deleted

## 2019-10-04 NOTE — Anesthesia Postprocedure Evaluation (Signed)
Anesthesia Post Note  Patient: Joseph Raymond  Procedure(s) Performed: ESOPHAGOGASTRODUODENOSCOPY (EGD) (N/A )  Patient location during evaluation: PACU Anesthesia Type: General Level of consciousness: awake and alert Pain management: pain level controlled Vital Signs Assessment: post-procedure vital signs reviewed and stable Respiratory status: spontaneous breathing, nonlabored ventilation, respiratory function stable and patient connected to nasal cannula oxygen Cardiovascular status: blood pressure returned to baseline and stable Postop Assessment: no apparent nausea or vomiting Anesthetic complications: no     Last Vitals:  Vitals:   10/02/19 0759 10/02/19 1149  BP: (!) 107/47 117/60  Pulse: 60 61  Resp: 18 18  Temp: 37 C 36.9 C  SpO2: 96% 97%    Last Pain:  Vitals:   10/02/19 1149  TempSrc: Oral  PainSc:                  Yevette Edwards

## 2019-10-05 NOTE — Discharge Summary (Addendum)
[facility] Discharge Summary   Patient: Joseph Raymond WUJ:[id number]  PCP: [name]  Date of admission: 09/29/2019   Date of discharge: 10/02/2019     Discharge Diagnoses:   Principal Problem:   Symptomatic anemia Active Problems:   GIB (gastrointestinal bleeding)   H/O mechanical aortic valve replacement   HTN (hypertension)   HLD (hyperlipidemia)   Type II diabetes mellitus with renal manifestations (HCC)   Iron deficiency anemia   Admitted From: Home Disposition:  Home   Recommendations for Outpatient Follow-up:  1. PCP: Follow-up with PCP in 1 week with repeat CBC.  Follow-up with GI as recommended. 2. Follow up LABS/TEST: Repeat CBC  Follow-up Information    [facility]. Schedule an appointment as soon as possible for a visit in 1 week(s).   Specialty: General Practice Contact information: [address] [place] [address] [phone]        Stanton Kidney, MD. Schedule an appointment as soon as possible for a visit.   Specialty: Gastroenterology Contact information: [address] [place] [address] [phone]        Alwyn Pea, MD. Schedule an appointment as soon as possible for a visit in 2 week(s).   Specialties: Cardiology, Internal Medicine Contact information: [address] [place] [address] [phone]          Diet recommendation: Cardiac diet  Activity: The patient is advised to gradually reintroduce usual activities, as tolerated  Discharge Condition: stable  Code Status: Full code   History of present illness: As per the H and P dictated on admission, "Joseph Raymond is a 59 y.o. 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  Pt speaks Spanish, and understands little [place].  History is obtained with help of iPad translator. Pt states that he has been having generalized weakness, shortness of breath, lightheadedness, dizziness for more than 1 week.  The symptoms have been progressively worsening. He does note that he has had increasingly dark stools over the same time period, though he thought this was due to eating a large amount of black beans. Patient does not have chest pain, cough, nausea, vomiting, diarrhea or abdominal pain.  No fever or chills. No symptoms of UTI or unilateral weakness. Pt was seen in clinic yesterday and had lab drawn.  He was found to have low hemoglobin 3.9 (10.2 on 12/15/2018).  Patient was advised to come to ED for further evaluation and treatment."  Hospital Course:  Summary of his active problems in the hospital is as following. 1.  Symptomatic anemia. Iron deficiency. Upper GI bleed with history of dielFoy lesion Acute thrombocytopenia dilutional in nature GI consulted Treated with IV Protonix drip. Stopping anticoagulation. 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 Hemoglobin stable after transfusion. SP for PRBC transfusion. SP IV iron, 1 dose of subcutaneous B12 as well as oral iron folic acid and B12 replacement  2.  History of endocarditis. 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 From [facility]. 12/04/16 Dr. Lynann Beaver 1. Aortic valve replacement with a 25 mm Inspiris bovine pericardial bioprosthetic valve. 2. Mitral valve replacement with a 29 mm Magna Mitral Ease bovine pericardial bioprosthetic valve. 3. Tricuspid valve annuloplasty with a 30 mm Car[employer]  Ring.  Patient is on anticoagulation with Coumadin. Patient was recently admitted in the hospital with life-threatening GI bleed in [facility] in April 2021. This  is a second admission right now with a similar presentation. At present cardiology following. Anticoagulation is on hold. Recommend since there is no strong indication for anticoagulation patient will benefit from discontinuing it going forward. Follow-up with cardiology outpatient.  3.  Hypertension Resuming home medication on discharge given blood pressure stability.  4.  Hyperlipidemia Continue Lipitor  5.  Diabetes mellitus with renal complication.  Controlled Resuming home medication on discharge.  6.  Obesity Body mass index is 38.47 kg/m.   7.  Right flank itching with erythema. Resolved. Topical hydrocortisone and Eucerin cream.  Patient was ambulatory without any assistance. On the day of the discharge the patient's vitals were stable, and no other acute medical condition were reported by patient. the patient was felt safe to be discharge at Home with no therapy needed on discharge.  Consultants: Cardiology Gastroenterology  Procedures: EGD 5 PRBC  Discharge Exam: Spanish interpreter was used to communicate with the patient.  General: Appear in no distress, no Rash; Oral Mucosa Clear, moist. Cardiovascular: S1 and S2 Present, no Murmur, Respiratory: normal respiratory effort, Bilateral Air entry present and no Crackles, no wheezes Abdomen: Bowel Sound present, Soft and no tenderness, no hernia Extremities: no Pedal edema, no calf tenderness Neurology: alert and oriented to time, place, and person affect appropriate.  Filed Weights   09/30/19 0411 10/01/19 0457 10/02/19 0441  Weight: 104 kg 103.6 kg 101.7 kg   Vitals:   10/02/19 0759 10/02/19 1149  BP: (!) 107/47 117/60  Pulse: 60 61  Resp: 18 18  Temp: 98.6 F (37 C) 98.4 F (36.9 C)  SpO2: 96% 97%    DISCHARGE MEDICATION: Allergies as of 10/02/2019   No Known Allergies     Medication List    STOP taking these medications   warfarin 1 MG tablet Commonly known as: COUMADIN   warfarin 6 MG  tablet Commonly known as: COUMADIN     TAKE these medications   amoxicillin 500 MG capsule Commonly known as: AMOXIL Take 500 mg by mouth 2 (two) times daily. Notes to patient: Tonight at 9P.    atorvastatin 40 MG tablet Commonly known as: LIPITOR Take 40 mg by mouth daily. Notes to patient: Tonight at 6P.    ferrous fumarate-b12-vitamic C-folic acid capsule Commonly known as: TRINSICON / FOLTRIN Take 1 capsule by mouth 2 (two) times daily after a meal. Notes to patient: Today after dinner.    furosemide 20 MG tablet Commonly known as: LASIX Take 20 mg by mouth daily. Notes to patient: Today when home.    losartan 50 MG tablet Commonly known as: COZAAR Take 50 mg by mouth daily. Notes to patient: Today when home.    metFORMIN 500 MG 24 hr tablet Commonly known as: GLUCOPHAGE-XR Take 500 mg by mouth daily. Notes to patient: Today when home.    pantoprazole 40 MG tablet Commonly known as: PROTONIX Take 1 tablet (40 mg total) by mouth 2 (two) times daily before a meal. Notes to patient: Today before dinner.    polyethylene glycol 17 g packet Commonly known as: MIRALAX / GLYCOLAX Take 17 g by mouth daily. Notes to patient: Tomorrow at 9A.       No Known Allergies Discharge Instructions    Diet - low sodium heart healthy   Complete by: As directed    Increase activity  slowly   Complete by: As directed       The results of significant diagnostics from this hospitalization (including imaging, microbiology, ancillary and laboratory) are listed below for reference.    Significant Diagnostic Studies: ECHOCARDIOGRAM COMPLETE  Result Date: 10/01/2019    ECHOCARDIOGRAM REPORT   Patient Name:   Joseph Raymond Date of Exam: 09/30/2019 Medical Rec #:  [id number]           Height:       64.0 in Accession #:    [id number]          Weight:       229.3 lb Date of Birth:  December 20, 1960           BSA:          2.073 m Patient Age:    69 years            BP:           124/72  mmHg Patient Gender: M                   HR:           56 bpm. Exam Location:  [facility] Procedure: 2D Echo, Cardiac Doppler and Color Doppler Indications:     Aortic valve disorder 424.1  History:         Patient has no prior history of Echocardiogram examinations.                  Risk Factors:Diabetes.  Sonographer:     Sherrie Sport RDCS (AE) Referring Phys:  [id number] Girard Diagnosing Phys: Ida Rogue MD IMPRESSIONS  1. Left ventricular ejection fraction, by estimation, is 55 to 60%. The left ventricle has normal function. The left ventricle has no regional wall motion abnormalities. There is moderate left ventricular hypertrophy. Left ventricular diastolic parameters are consistent with Grade II diastolic dysfunction (pseudonormalization).  2. Right ventricular systolic function is normal. The right ventricular size is mildly enlarged. There is mildly elevated pulmonary artery systolic pressure.  3. Left atrial size was moderately dilated.  4. The mitral valve has been repaired/replaced, bioprosthetic. No evidence of mitral valve regurgitation. Mild mitral stenosis.  5. The aortic valve has been repaired/replaced, bioprosthetic. Aortic valve regurgitation is not visualized. Moderate aortic valve stenosis.  6. The inferior vena cava is dilated in size with <50% respiratory variability, suggesting right atrial pressure of 15 mmHg. FINDINGS  Left Ventricle: Left ventricular ejection fraction, by estimation, is 55 to 60%. The left ventricle has normal function. The left ventricle has no regional wall motion abnormalities. The left ventricular internal cavity size was normal in size. There is  moderate left ventricular hypertrophy. Left ventricular diastolic parameters are consistent with Grade II diastolic dysfunction (pseudonormalization). Right Ventricle: The right ventricular size is mildly enlarged. No increase in right ventricular wall thickness. Right ventricular systolic function is normal. There is  mildly elevated pulmonary artery systolic pressure. The tricuspid regurgitant velocity is 2.42 m/s, and with an assumed right atrial pressure of 15 mmHg, the estimated right ventricular systolic pressure is 75.6 mmHg. Left Atrium: Left atrial size was moderately dilated. Right Atrium: Right atrial size was normal in size. Pericardium: There is no evidence of pericardial effusion. Mitral Valve: The mitral valve has been repaired/replaced. Normal mobility of the mitral valve leaflets. No evidence of mitral valve regurgitation. Mild mitral valve stenosis. MV peak gradient, 27.0 mmHg. The mean mitral valve gradient is 9.0 mmHg. Tricuspid Valve: The tricuspid valve  is normal in structure. Tricuspid valve regurgitation is not demonstrated. No evidence of tricuspid stenosis. Aortic Valve: The aortic valve has been repaired/replaced. Aortic valve regurgitation is not visualized. Mild to moderate aortic stenosis is present. Aortic valve mean gradient measures 17.3 mmHg. Aortic valve peak gradient measures 29.7 mmHg. Aortic valve area, by VTI measures 1.29 cm. Pulmonic Valve: The pulmonic valve was normal in structure. Pulmonic valve regurgitation is not visualized. No evidence of pulmonic stenosis. Aorta: The aortic root is normal in size and structure. Venous: The inferior vena cava is dilated in size with less than 50% respiratory variability, suggesting right atrial pressure of 15 mmHg. IAS/Shunts: No atrial level shunt detected by color flow Doppler.  LEFT VENTRICLE PLAX 2D LVIDd:         5.00 cm  Diastology LVIDs:         3.04 cm  LV e' lateral:   4.46 cm/s LV PW:         1.88 cm  LV E/e' lateral: 37.7 LV IVS:        1.43 cm  LV e' medial:    7.40 cm/s LVOT diam:     2.00 cm  LV E/e' medial:  22.7 LV SV:         70 LV SV Index:   34 LVOT Area:     3.14 cm  RIGHT VENTRICLE RV Basal diam:  4.46 cm RV S prime:     12.60 cm/s TAPSE (M-mode): 4.0 cm LEFT ATRIUM              Index       RIGHT ATRIUM           Index LA diam:         4.70 cm  2.27 cm/m  RA Area:     21.30 cm LA Vol (A2C):   93.6 ml  45.14 ml/m RA Volume:   61.00 ml  29.42 ml/m LA Vol (A4C):   99.7 ml  48.08 ml/m LA Biplane Vol: 101.0 ml 48.71 ml/m  AORTIC VALVE                    PULMONIC VALVE AV Area (Vmax):    1.22 cm     PV Vmax:        0.94 m/s AV Area (Vmean):   1.23 cm     PV Peak grad:   3.5 mmHg AV Area (VTI):     1.29 cm     RVOT Peak grad: 8 mmHg AV Vmax:           272.67 cm/s AV Vmean:          188.667 cm/s AV VTI:            0.543 m AV Peak Grad:      29.7 mmHg AV Mean Grad:      17.3 mmHg LVOT Vmax:         106.00 cm/s LVOT Vmean:        73.700 cm/s LVOT VTI:          0.223 m LVOT/AV VTI ratio: 0.41  AORTA Ao Root diam: 3.10 cm MITRAL VALVE                TRICUSPID VALVE MV Area (PHT): 1.33 cm     TR Peak grad:   23.4 mmHg MV Peak grad:  27.0 mmHg    TR Vmax:        242.00 cm/s MV Mean grad:  9.0 mmHg MV Vmax:       2.60 m/s     SHUNTS MV Vmean:      136.0 cm/s   Systemic VTI:  0.22 m MV Decel Time: 570 msec     Systemic Diam: 2.00 cm MV E velocity: 168.00 cm/s MV A velocity: 131.00 cm/s MV E/A ratio:  1.28 Julien Nordmann MD Electronically signed by Julien Nordmann MD Signature Date/Time: 10/01/2019/1:24:43 PM    Final     Microbiology: Recent Results (from the past 240 hour(s))  SARS Coronavirus 2 by RT PCR (hospital order, performed in [facility] hospital lab) Nasopharyngeal Nasopharyngeal Swab     Status: None   Collection Time: 09/29/19  1:29 PM   Specimen: Nasopharyngeal Swab  Result Value Ref Range Status   SARS Coronavirus 2 NEGATIVE NEGATIVE Final    Comment: (NOTE) SARS-CoV-2 target nucleic acids are NOT DETECTED. The SARS-CoV-2 RNA is generally detectable in upper and lower respiratory specimens during the acute phase of infection. The lowest concentration of SARS-CoV-2 viral copies this assay can detect is 250 copies / mL. A negative result does not preclude SARS-CoV-2 infection and should not be used as the sole basis for  treatment or other patient management decisions.  A negative result may occur with improper specimen collection / handling, submission of specimen other than nasopharyngeal swab, presence of viral mutation(s) within the areas targeted by this assay, and inadequate number of viral copies (<250 copies / mL). A negative result must be combined with clinical observations, patient history, and epidemiological information. Fact Sheet for Patients:   BoilerBrush.com.cy Fact Sheet for Healthcare Providers: https://pope.com/ This test is not yet approved or cleared  by the [place] FDA and has been authorized for detection and/or diagnosis of SARS-CoV-2 by FDA under an Emergency Use Authorization (EUA).  This EUA will remain in effect (meaning this test can be used) for the duration of the COVID-19 declaration under Section 564(b)(1) of the Act, 21 U.S.C. section 360bbb-3(b)(1), unless the authorization is terminated or revoked sooner. Performed at [facility], [address] [place], [place] [address]      Labs: CBC: Recent Labs  Lab 09/30/19 1716 09/30/19 2049 10/01/19 0557 10/02/19 0026 10/02/19 0656  WBC 7.5 7.6 8.5 10.7* 10.7*  NEUTROABS  --   --  5.8  --   --   HGB 6.8* 6.6* 7.4* 7.5* 7.5*  HCT 23.2* 21.8* 24.5* 24.3* 24.6*  MCV 77.6* 75.2* 78.0* 77.9* 77.8*  PLT 89* 91* 85* 93* 90*   Basic Metabolic Panel: Recent Labs  Lab 09/29/19 1126 09/29/19 2028 09/30/19 0252  NA 138 141 141  K 4.0 3.9 4.0  CL 105 110 109  CO2 27 26 27   GLUCOSE 231* 131* 153*  BUN 23 20 18   CREATININE 0.64 0.61 0.63  CALCIUM 8.5* 8.2* 8.4*   Liver Function Tests: Recent Labs  Lab 09/29/19 1329 09/29/19 2028  AST 24 22  ALT 22 21  ALKPHOS 56 48  BILITOT 0.8 1.7*  PROT 6.4* 5.7*  ALBUMIN 3.6 3.2*   No results for input(s): LIPASE, AMYLASE in the last 168 hours. No results for input(s): AMMONIA in the last 168  hours. Cardiac Enzymes: No results for input(s): CKTOTAL, CKMB, CKMBINDEX, TROPONINI in the last 168 hours. BNP (last 3 results) No results for input(s): BNP in the last 8760 hours. CBG: Recent Labs  Lab 10/01/19 0753 10/01/19 1627 10/01/19 2025 10/02/19 0759 10/02/19 1147  GLUCAP 116* 128* 164* 111* 130*  Time spent: 35 minutes  Signed:  Lynden Oxford  [facility] 10/02/2019 6:24 PM

## 2020-07-23 ENCOUNTER — Inpatient Hospital Stay
Admission: EM | Admit: 2020-07-23 | Discharge: 2020-07-26 | DRG: 872 | Disposition: A | Discharge status: Home Health | Payer: Medicare Other | Attending: Student | Admitting: Student

## 2020-07-23 ENCOUNTER — Emergency Department: Payer: Medicare Other

## 2020-07-23 ENCOUNTER — Encounter: Payer: Self-pay | Admitting: Emergency Medicine

## 2020-07-23 ENCOUNTER — Observation Stay: Payer: Medicare Other

## 2020-07-23 ENCOUNTER — Other Ambulatory Visit: Payer: Self-pay

## 2020-07-23 DIAGNOSIS — E871 Hypo-osmolality and hyponatremia: Secondary | ICD-10-CM

## 2020-07-23 DIAGNOSIS — Z952 Presence of prosthetic heart valve: Secondary | ICD-10-CM

## 2020-07-23 DIAGNOSIS — E669 Obesity, unspecified: Secondary | ICD-10-CM | POA: Diagnosis present

## 2020-07-23 DIAGNOSIS — E782 Mixed hyperlipidemia: Secondary | ICD-10-CM

## 2020-07-23 DIAGNOSIS — N400 Enlarged prostate without lower urinary tract symptoms: Secondary | ICD-10-CM | POA: Diagnosis present

## 2020-07-23 DIAGNOSIS — E1165 Type 2 diabetes mellitus with hyperglycemia: Secondary | ICD-10-CM | POA: Diagnosis present

## 2020-07-23 DIAGNOSIS — Z833 Family history of diabetes mellitus: Secondary | ICD-10-CM

## 2020-07-23 DIAGNOSIS — G8929 Other chronic pain: Secondary | ICD-10-CM | POA: Diagnosis present

## 2020-07-23 DIAGNOSIS — Z7984 Long term (current) use of oral hypoglycemic drugs: Secondary | ICD-10-CM

## 2020-07-23 DIAGNOSIS — E1129 Type 2 diabetes mellitus with other diabetic kidney complication: Secondary | ICD-10-CM

## 2020-07-23 DIAGNOSIS — K766 Portal hypertension: Secondary | ICD-10-CM | POA: Diagnosis present

## 2020-07-23 DIAGNOSIS — Z6836 Body mass index (BMI) 36.0-36.9, adult: Secondary | ICD-10-CM

## 2020-07-23 DIAGNOSIS — M545 Low back pain, unspecified: Secondary | ICD-10-CM

## 2020-07-23 DIAGNOSIS — A419 Sepsis, unspecified organism: Secondary | ICD-10-CM | POA: Diagnosis not present

## 2020-07-23 DIAGNOSIS — M25561 Pain in right knee: Secondary | ICD-10-CM | POA: Diagnosis present

## 2020-07-23 DIAGNOSIS — N12 Tubulo-interstitial nephritis, not specified as acute or chronic: Secondary | ICD-10-CM | POA: Diagnosis present

## 2020-07-23 DIAGNOSIS — Z20822 Contact with and (suspected) exposure to covid-19: Secondary | ICD-10-CM | POA: Diagnosis present

## 2020-07-23 DIAGNOSIS — Z953 Presence of xenogenic heart valve: Secondary | ICD-10-CM

## 2020-07-23 DIAGNOSIS — M25562 Pain in left knee: Secondary | ICD-10-CM | POA: Diagnosis present

## 2020-07-23 DIAGNOSIS — R809 Proteinuria, unspecified: Secondary | ICD-10-CM

## 2020-07-23 DIAGNOSIS — Z8679 Personal history of other diseases of the circulatory system: Secondary | ICD-10-CM

## 2020-07-23 DIAGNOSIS — I10 Essential (primary) hypertension: Secondary | ICD-10-CM

## 2020-07-23 DIAGNOSIS — Z79899 Other long term (current) drug therapy: Secondary | ICD-10-CM

## 2020-07-23 DIAGNOSIS — R6510 Systemic inflammatory response syndrome (SIRS) of non-infectious origin without acute organ dysfunction: Secondary | ICD-10-CM

## 2020-07-23 DIAGNOSIS — Z8739 Personal history of other diseases of the musculoskeletal system and connective tissue: Secondary | ICD-10-CM

## 2020-07-23 DIAGNOSIS — E785 Hyperlipidemia, unspecified: Secondary | ICD-10-CM | POA: Diagnosis present

## 2020-07-23 DIAGNOSIS — D696 Thrombocytopenia, unspecified: Secondary | ICD-10-CM | POA: Diagnosis present

## 2020-07-23 DIAGNOSIS — K219 Gastro-esophageal reflux disease without esophagitis: Secondary | ICD-10-CM | POA: Diagnosis present

## 2020-07-23 HISTORY — DX: Essential (primary) hypertension: I10

## 2020-07-23 LAB — CBC WITH DIFFERENTIAL/PLATELET
Abs Immature Granulocytes: 0.04 10*3/uL (ref 0.00–0.07)
Basophils Absolute: 0 10*3/uL (ref 0.0–0.1)
Basophils Relative: 0 %
Eosinophils Absolute: 0 10*3/uL (ref 0.0–0.5)
Eosinophils Relative: 0 %
HCT: 38.6 % — ABNORMAL LOW (ref 39.0–52.0)
Hemoglobin: 12.7 g/dL — ABNORMAL LOW (ref 13.0–17.0)
Immature Granulocytes: 0 %
Lymphocytes Relative: 10 %
Lymphs Abs: 0.9 10*3/uL (ref 0.7–4.0)
MCH: 29.3 pg (ref 26.0–34.0)
MCHC: 32.9 g/dL (ref 30.0–36.0)
MCV: 89.1 fL (ref 80.0–100.0)
Monocytes Absolute: 0.6 10*3/uL (ref 0.1–1.0)
Monocytes Relative: 7 %
Neutro Abs: 7.4 10*3/uL (ref 1.7–7.7)
Neutrophils Relative %: 83 %
Platelets: 91 10*3/uL — ABNORMAL LOW (ref 150–400)
RBC: 4.33 MIL/uL (ref 4.22–5.81)
RDW: 14.9 % (ref 11.5–15.5)
WBC: 8.9 10*3/uL (ref 4.0–10.5)
nRBC: 0 % (ref 0.0–0.2)

## 2020-07-23 LAB — URINALYSIS, COMPLETE (UACMP) WITH MICROSCOPIC
Bilirubin Urine: NEGATIVE
Glucose, UA: 150 mg/dL — AB
Ketones, ur: 5 mg/dL — AB
Nitrite: NEGATIVE
Protein, ur: 30 mg/dL — AB
Specific Gravity, Urine: 1.025 (ref 1.005–1.030)
pH: 5 (ref 5.0–8.0)

## 2020-07-23 LAB — CK: Total CK: 448 U/L — ABNORMAL HIGH (ref 49–397)

## 2020-07-23 LAB — BASIC METABOLIC PANEL
Anion gap: 9 (ref 5–15)
BUN: 15 mg/dL (ref 8–23)
CO2: 22 mmol/L (ref 22–32)
Calcium: 8.5 mg/dL — ABNORMAL LOW (ref 8.9–10.3)
Chloride: 99 mmol/L (ref 98–111)
Creatinine, Ser: 0.87 mg/dL (ref 0.61–1.24)
GFR, Estimated: >60 mL/min (ref >60)
Glucose, Bld: 238 mg/dL — ABNORMAL HIGH (ref 70–99)
Potassium: 3.9 mmol/L (ref 3.5–5.1)
Sodium: 130 mmol/L — ABNORMAL LOW (ref 135–145)

## 2020-07-23 LAB — LACTIC ACID, PLASMA: Lactic Acid, Venous: 1.3 mmol/L (ref 0.5–1.9)

## 2020-07-23 LAB — PROCALCITONIN: Procalcitonin: 0.63 ng/mL

## 2020-07-23 LAB — TSH: TSH: 0.278 u[IU]/mL — ABNORMAL LOW (ref 0.350–4.500)

## 2020-07-23 MED ORDER — SODIUM CHLORIDE 0.9 % IV SOLN
1.0000 g | Freq: Three times a day (TID) | INTRAVENOUS | Status: AC
  Administered 2020-07-23 – 2020-07-24 (×4): 1 g via INTRAVENOUS
  Filled 2020-07-23 (×6): qty 1

## 2020-07-23 MED ORDER — SODIUM CHLORIDE 0.9 % IV BOLUS
1000.0000 mL | Freq: Once | INTRAVENOUS | Status: AC
  Administered 2020-07-23: 1000 mL via INTRAVENOUS

## 2020-07-23 MED ORDER — ACETAMINOPHEN 325 MG PO TABS
650.0000 mg | ORAL_TABLET | Freq: Once | ORAL | Status: AC
  Administered 2020-07-23: 650 mg via ORAL
  Filled 2020-07-23: qty 2

## 2020-07-23 MED ORDER — LACTATED RINGERS IV BOLUS (SEPSIS)
800.0000 mL | Freq: Once | INTRAVENOUS | Status: AC
  Administered 2020-07-23: 800 mL via INTRAVENOUS

## 2020-07-23 MED ORDER — ATORVASTATIN CALCIUM 20 MG PO TABS
40.0000 mg | ORAL_TABLET | Freq: Every day | ORAL | Status: DC
  Administered 2020-07-24: 40 mg via ORAL
  Filled 2020-07-23: qty 2

## 2020-07-23 MED ORDER — LACTATED RINGERS IV SOLN: INTRAVENOUS | Status: DC

## 2020-07-23 MED ORDER — SODIUM CHLORIDE 0.9 % IV SOLN
1.0000 g | Freq: Once | INTRAVENOUS | Status: AC
  Administered 2020-07-23: 1 g via INTRAVENOUS
  Filled 2020-07-23: qty 10

## 2020-07-23 MED ORDER — ACETAMINOPHEN 500 MG PO TABS
1000.0000 mg | ORAL_TABLET | Freq: Four times a day (QID) | ORAL | Status: DC | PRN
  Administered 2020-07-23 – 2020-07-24 (×2): 1000 mg via ORAL
  Filled 2020-07-23 (×3): qty 2

## 2020-07-23 MED ORDER — METFORMIN HCL ER 500 MG PO TB24: 500.0000 mg | ORAL_TABLET | Freq: Every day | ORAL | Status: DC

## 2020-07-23 MED ORDER — LACTATED RINGERS IV BOLUS (SEPSIS)
1000.0000 mL | Freq: Once | INTRAVENOUS | Status: AC
  Administered 2020-07-23: 1000 mL via INTRAVENOUS

## 2020-07-23 MED ORDER — ENOXAPARIN SODIUM 40 MG/0.4ML ~~LOC~~ SOLN: 40.0000 mg | SUBCUTANEOUS | Status: DC

## 2020-07-23 MED ORDER — PANTOPRAZOLE SODIUM 40 MG PO TBEC
40.0000 mg | DELAYED_RELEASE_TABLET | Freq: Every day | ORAL | Status: DC
  Administered 2020-07-24 – 2020-07-26 (×3): 40 mg via ORAL
  Filled 2020-07-23 (×3): qty 1

## 2020-07-23 MED ORDER — LEVOFLOXACIN IN D5W 750 MG/150ML IV SOLN: 750.0000 mg | INTRAVENOUS | Status: DC

## 2020-07-23 MED ORDER — LOSARTAN POTASSIUM 50 MG PO TABS
50.0000 mg | ORAL_TABLET | Freq: Every day | ORAL | Status: DC
  Administered 2020-07-24 – 2020-07-26 (×2): 50 mg via ORAL
  Filled 2020-07-23 (×2): qty 1

## 2020-07-23 NOTE — ED Notes (Signed)
After RN obtained temperature, pt reports fever x 2 days with painful urination and frequency. Pt is on chronic amoxicillin BID but is not able to tell RN why. Pt denies frequent UTIs/

## 2020-07-23 NOTE — H&P (Addendum)
History and Physical   Joseph Raymond WER:[id number] DOB: 1961/04/26 DOA: 07/23/2020  PCP: [name]  Outpatient Specialists: Dr. [name], [facility] Patient coming from: home via POV  I have personally briefly reviewed patient's old medical records in [facility] EMR.  Chief Concern: back pain   HPI: Joseph Raymond is a 60 y.o. 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  He presents to the emergency department for chief concerns of worsening bilateral low back pain.  He reports the back pain started on Thursday, 07/20/2020.  He endorses dysuria that started on 3/10 as well.  He denies trauma.  He endorses chills and subjective fevers but did not take a temperature at home.  He states that the back pain is persistent and not dependent on position.  Social history: He lives by himself. He is currently on disability and formerly worked in [employer]. He denies history of tobacco use and recreational drug use. He states he drinks etoh infrequently and last drink was Friday.   Vaccination: Patient is vaccinated for COVID-19 x2 doses.  He is pending his booster dose on 08/03/2020.  ROS: Constitutional: no weight change, + fever ENT/Mouth: no sore throat, no rhinorrhea Eyes: no eye pain, no vision changes Cardiovascular: no chest pain, no dyspnea,  no edema, no palpitations Respiratory: no cough, no sputum, no wheezing Gastrointestinal: no nausea, no  vomiting, no diarrhea, no constipation Genitourinary: no urinary incontinence, + dysuria, + hematuria Musculoskeletal: no arthralgias, no myalgias. Back pain Skin: no skin lesions, no pruritus, Neuro: + weakness, no loss of consciousness, no syncope Psych: no anxiety, no depression, no decrease appetite Heme/Lymph: no bruising, no bleeding  ED Course: Discussed with ED provider, patient requiring hospitalization due to suspected pyelonephritis.  Vitals in the ED revealed a temperature of 103.2 and improved to 101.7, respiration rate of 15, heart rate of 64, satting at 100% on room air, blood pressure 121/64.  Labs in the emergency department was remarkable for serum sodium of 130, potassium 3.9, chloride 99, bicarb 22, nonfasting glucose 238, serum creatinine of 0.87, WBC of 8.9, hemoglobin 12.7, hematocrit 38.6, platelets of 91.  Assessment/Plan  Active Problems:   H/O mechanical aortic valve replacement   HTN (hypertension)   HLD (hyperlipidemia)   Type II diabetes mellitus with renal manifestations (HCC)   Pyelonephritis   # Fever and back pain-suspect secondary to pyelonephritis # Sepsis cannot be excluded -Status post ceftriaxone 1 g IV one-time dose, normal saline 1 L bolus, acetaminophen 650 mg p.o. per ED provider -UA was positive for leukocytes -Urine culture pending -Given patient's history of endocarditis and bacteremia and now on daily suppressive therapy with antibiotics, and developing fever, there is a clinical indication for blood cultures to be obtained -Blood cultures x2 collected, with the understanding that he received ceftriaxone 1 g dose in the ED -Sepsis bolus initiated for BMI of greater than 35 -LR 125 mL/h, for 1 day -Checking CK level, doubt rhabdomyolysis -Checking sed rate and CRP today -CBC in the a.m., CRP, sed rate in the a.m. -Meropenem 1 g every 8 hours -MRI of the thoracic and  lumbar spine with and without contrast ordered -Infectious disease  was consulted  # Elevated CK-monitor, resumed statin 43/14/2022 evening -Continue IVF as above  # History of chronic thrombocytopenia - cbc in the AM -Holding pharmacologic DVT prophylaxis at this time  #Hyperlipidemia-atorvastatin 40 mg nightly # Hypertension-losartan 50 mg daily resumed for 07/24/2020 # Non-insulin-dependent diabetes mellitus-metformin 500 mg twice daily # GERD Protonix 40 mg daily  # History of strep bovis endocarditis # History of enterococcal endocarditis # History of osteomyelitis -Please see under chart review  Covid test is pending  Chart reviewed.   Strep bovis prosthetic mitral valve endocarditis on 10/2020, TEE likely vegetation on prosthetic mitral valve.  Strep bovis was grown in both cultures, GPC in pairs, status post 6 weeks of IV ceftriaxone, was continued on p.o. amoxicillin for suppression therapy.  7 10/2019 had colonoscopy to rule out colonic neoplasia and found to have adenomatous polyp.  Enterococcal endocarditis and vertebral, T12 osteomyelitis status post bioprosthetic aortic valve and mitral valve replacement in July 2018.  Status post tricuspid valve anoplasty in July 2018.  Treated with long-term antibiotics.  History of hospitalization for upper GI bleed secondary to Dieulafoy lesion on 01/2019.  DVT prophylaxis: ted hose, pharmacologic DVT prophylaxis has been held due to thrombocytopenia Code Status: Full code Diet: Heart healthy/carb modified Family Communication: Updated daughter over the phone Disposition Plan: Pending clinical course Consults called: Infectious disease Admission status: Observation, MedSurg with telemetry  Past Medical History:  Diagnosis Date  . Chronic back pain   . Diabetes mellitus without complication (HCC)   . Hypertension   . Lumbar radiculopathy     Past Surgical History:  Procedure Laterality Date  . ESOPHAGOGASTRODUODENOSCOPY N/A 10/01/2019   Procedure: ESOPHAGOGASTRODUODENOSCOPY (EGD);  Surgeon:  Toledo, Boykin Nearing, MD;  Location: [facility] ENDOSCOPY;  Service: Gastroenterology;  Laterality: N/A;  . KNEE SURGERY     Social History:  reports that he has never smoked. He has never used smokeless tobacco. He reports current alcohol use of about 2.0 standard drinks of alcohol per week. He reports that he does not use drugs.  No Known Allergies Family History  Problem Relation Age of Onset  . Diabetes Mellitus II Mother    Family history: Family history reviewed and not pertinent  Prior to Admission medications   Medication Sig Start Date End Date Taking? Authorizing Provider  amoxicillin (AMOXIL) 500 MG capsule Take 500 mg by mouth 2 (two) times daily.    [provider]  atorvastatin (LIPITOR) 40 MG tablet Take 40 mg by mouth daily.    [provider]  ferrous fumarate-b12-vitamic C-folic acid (TRINSICON / FOLTRIN) capsule Take 1 capsule by mouth 2 (two) times daily after a meal. 10/02/19   Rolly Salter, MD  furosemide (LASIX) 20 MG tablet Take 20 mg by mouth daily.    [provider]  losartan (COZAAR) 50 MG tablet Take 50 mg by mouth daily.    [provider]  metFORMIN (GLUCOPHAGE-XR) 500 MG 24 hr tablet Take 500 mg by mouth daily.    [provider]  pantoprazole (PROTONIX) 40 MG tablet Take 1 tablet (40 mg total) by mouth 2 (two) times daily before a meal. 10/02/19   Rolly Salter, MD  polyethylene glycol (MIRALAX / GLYCOLAX) 17 g packet Take 17 g by mouth daily. 10/03/19   Rolly Salter, MD   Physical Exam: Vitals:   07/23/20 1420 07/23/20 1500 07/23/20 1630 07/23/20 1700  BP: (!) 112/49 (!) 103/57 99/60  111/71  Pulse: 79 74 65 67  Resp: 15 16 15 17   Temp: (!) 101.7 F (38.7 C)     TempSrc: Oral     SpO2: 95% 94% 97% 98%  Weight:      Height:       Constitutional: appears older than chronological age, NAD, calm, comfortable Eyes: PERRL, lids and conjunctivae normal ENMT: Mucous membranes are moist. Posterior pharynx clear of  any exudate or lesions. Age-appropriate dentition. Hearing appropriate Neck: normal, supple, no masses, no thyromegaly Respiratory: clear to auscultation bilaterally, no wheezing, no crackles. Normal respiratory effort. No accessory muscle use.  Cardiovascular: Regular rate and rhythm, no murmurs / rubs / gallops. No extremity edema. 2+ pedal pulses. No carotid bruits.  Abdomen: Morbidly obese abdomen, no tenderness, no masses palpated, no hepatosplenomegaly. Bowel sounds positive.  Musculoskeletal: no clubbing / cyanosis. No joint deformity upper and lower extremities. Good ROM, no contractures, no atrophy. Normal muscle tone.  Skin: no rashes, lesions, ulcers. No induration Neurologic: Sensation intact. Strength 5/5 in all 4.  Psychiatric: Normal judgment and insight. Alert and oriented x 3. Normal mood.   EKG: independently reviewed, showing sinus rhythm with rate of 63 and QTc 433  Chest x-ray on Admission: I personally reviewed and I agree with radiologist reading as below.  CT L-SPINE NO CHARGE  Result Date: 07/23/2020 CLINICAL DATA:  Fever and painful urination for 2 days. Back pain. History of prior lumbar fusion. EXAM: CT LUMBAR SPINE WITHOUT CONTRAST TECHNIQUE: Multidetector CT imaging of the lumbar spine was performed without intravenous contrast administration. Multiplanar CT image reconstructions were also generated. COMPARISON:  None. FINDINGS: Segmentation: There are five lumbar type vertebral bodies. The last full intervertebral disc space is labeled L5-S1. Alignment: Normal overall alignment of the lumbar vertebral bodies. There is slight anterolisthesis of L4 compared L5 but there is solid interbody fusion at this level in the facets are also fused at this level. Vertebrae: Superior endplate depression T12 is noted. This is of uncertain age. The lumbar vertebral bodies are intact. No acute fracture. Paraspinal and other soft tissues: No significant findings. Disc levels: T12-L1: No  disc protrusions, spinal or foraminal stenosis. L1-2: Mild annular bulge with slight flattening of the ventral thecal sac but no significant spinal or foraminal stenosis. Mild/moderate facet disease. L2-3: Degenerative disc disease and diffuse bulging degenerated annulus in combination with short pedicles and advanced facet disease contributing to moderately severe to severe spinal and bilateral lateral recess stenosis. No significant foraminal stenosis. L3-4: Bulging degenerated annulus, short pedicles, advanced facet disease and ligamentum flavum thickening contributing to moderately severe to severe spinal and bilateral lateral recess stenosis. Mild foraminal stenosis also. L4-5: Interbody fusion changes are noted. Mild anterolisthesis of L4 with osteophytic spurring and advanced facet disease contributing to mild spinal and bilateral lateral recess stenosis. No significant foraminal stenosis. L5-S1: Advanced facet disease but no spinal or lateral recess stenosis. Mild left and moderate right foraminal stenosis mainly due to spurring changes. IMPRESSION: 1. Degenerative lumbar spondylosis with multilevel disc disease and advanced facet disease. 2. Moderately severe to severe spinal and bilateral lateral recess stenosis at L2-3 and L3-4. 3. Mild spinal and bilateral lateral recess stenosis at L4-5. 4. Interbody fusion changes at L4-5. 5. Mild left and moderate right foraminal stenosis at L5-S1 mainly due to spurring changes. 6. Age indeterminate compression deformity of T12. Aortic Atherosclerosis (ICD10-I70.0). Electronically Signed   By: 07/25/2020 M.D.   On: 07/23/2020 16:28   CT Renal Stone Study  Result Date: 07/23/2020 CLINICAL DATA:  Bilateral flank pain and hematuria. EXAM: CT ABDOMEN AND PELVIS WITHOUT CONTRAST TECHNIQUE: Multidetector CT imaging of the abdomen and pelvis was performed following the standard protocol without IV contrast. COMPARISON:  None FINDINGS: Lower chest: The lung bases are  clear. No pleural effusions or pulmonary lesions. The heart is normal in size. Prosthetic mitral and pulmonic valves are noted. No pericardial effusion. Hepatobiliary: Suspect cirrhotic changes involving the liver with an irregular liver contour, dilated hepatic fissures and right lobe atrophy. Suspect portal venous hypertension and portal venous collaterals. No obvious hepatic lesions. The gallbladder is unremarkable. No common bile duct dilatation. Pancreas: No mass, inflammation or ductal dilatation. Spleen: Borderline splenomegaly.  No splenic lesions. Adrenals/Urinary Tract: The adrenal glands are unremarkable. Perinephric interstitial changes around both kidneys could be acute or chronic. No renal calculi or hydroureteronephrosis. No obstructing ureteral calculi. No bladder calculi. No worrisome renal or bladder lesions are identified without contrast. Stomach/Bowel: The stomach, duodenum, small bowel and colon are grossly normal. No acute inflammatory changes, mass lesions or obstructive findings. The terminal ileum and appendix are normal. Vascular/Lymphatic: Scattered atherosclerotic calcifications but no aortic aneurysm. No mesenteric or retroperitoneal mass or adenopathy. Small scattered lymph nodes are noted. Reproductive: The prostate gland is enlarged. The seminal vesicles are unremarkable. Other: No free pelvic fluid collections. Small scattered pelvic lymph nodes are noted. Musculoskeletal: No significant bony findings. No bone lesions or acute fractures. The T12 fracture appears remote. Interbody fusion changes noted at L4-5. IMPRESSION: 1. No renal, ureteral or bladder calculi or mass. 2. Perinephric interstitial changes around both kidneys could be acute or chronic. Could not exclude the possibility of pyelonephritis. 3. Suspect cirrhotic changes involving the liver with portal venous hypertension, portal venous collaterals and borderline splenomegaly. No obvious hepatic lesions. 4. Enlarged  prostate gland. 5. Small scattered retroperitoneal and pelvic lymph nodes which are indeterminate. A follow-up CT examination with contrast may be helpful for further evaluation. Electronically Signed   By: Rudie Meyer M.D.   On: 07/23/2020 16:40   Labs on Admission: I have personally reviewed following labs  CBC: Recent Labs  Lab 07/23/20 1322  WBC 8.9  NEUTROABS 7.4  HGB 12.7*  HCT 38.6*  MCV 89.1  PLT 91*   Basic Metabolic Panel: Recent Labs  Lab 07/23/20 1322  NA 130*  K 3.9  CL 99  CO2 22  GLUCOSE 238*  BUN 15  CREATININE 0.87  CALCIUM 8.5*   GFR: Estimated Creatinine Clearance: 97 mL/min (by C-G formula based on SCr of 0.87 mg/dL).  Urine analysis:    Component Value Date/Time   COLORURINE AMBER (A) 07/23/2020 1325   APPEARANCEUR HAZY (A) 07/23/2020 1325   LABSPEC 1.025 07/23/2020 1325   PHURINE 5.0 07/23/2020 1325   GLUCOSEU 150 (A) 07/23/2020 1325   HGBUR MODERATE (A) 07/23/2020 1325   BILIRUBINUR NEGATIVE 07/23/2020 1325   KETONESUR 5 (A) 07/23/2020 1325   PROTEINUR 30 (A) 07/23/2020 1325   UROBILINOGEN 0.2 08/01/2008 1221   NITRITE NEGATIVE 07/23/2020 1325   LEUKOCYTESUR SMALL (A) 07/23/2020 1325   CRITICAL CARE Performed by: Nadyne Coombes Jesse Hirst  Total critical care time: 35 minutes  Critical care time was exclusive of separately billable procedures and treating other patients.  Critical care was necessary to treat or prevent imminent or life-threatening deterioration. Sepsis, bacteremia  Critical care was time spent personally by me on the following activities: development of treatment plan with patient and/or surrogate as well as nursing, discussions with consultants,  evaluation of patient's response to treatment, examination of patient, obtaining history from patient or surrogate, ordering and performing treatments and interventions, ordering and review of laboratory studies, ordering and review of radiographic studies, pulse oximetry and re-evaluation of  patient's condition.  Saralynn Langhorst N Yanky Vanderburg D.O. [facility]  If 7PM-7AM, please contact overnight-coverage provider If 7AM-7PM, please contact day coverage provider [email or web address]  07/23/2020, 5:36 PM

## 2020-07-23 NOTE — ED Provider Notes (Signed)
[facility] Emergency Department Provider Note  ____________________________________________  Time seen: Approximately 3:41 PM  I have reviewed the triage vital signs and the nursing notes.   HISTORY  Chief Complaint Back Pain and Leg Pain    HPI Joseph Raymond is a 60 y.o. male who presents emergency department complaining of worsening lower back pain.  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  Patient states that he has been on oral antibiotics for the past 2 years and is managing well.  Several days ago he began with dysuria, polyuria and developed worsening back pain.  He does have a fever but denies any nasal congestion, sore throat, cough.  Patient has no emesis, diarrhea.  He does have dysuria but no reported hematuria.  No history of nephrolithiasis.  Patient does have an increase in his lumbar radiculopathy but denies any bowel or bladder dysfunction, saddle anesthesia or paresthesias.  Patient is on amoxicillin chronically for his history of osteomyelitis and endocarditis.  He has not missed any doses of his antibiotic.         Past Medical History:  Diagnosis Date  . Chronic back pain   . Diabetes mellitus without complication (HCC)   . Hypertension   . Lumbar radiculopathy     Patient Active Problem List   Diagnosis Date Noted  . Pyelonephritis 07/23/2020  . Symptomatic anemia 09/29/2019  . GIB (gastrointestinal bleeding) 09/29/2019  . H/O mechanical aortic valve replacement 09/29/2019  . HTN (hypertension) 09/29/2019  . HLD (hyperlipidemia) 09/29/2019  . Type II diabetes mellitus with renal manifestations (HCC) 09/29/2019  . Iron deficiency anemia 09/29/2019    Past Surgical History:  Procedure Laterality Date  . ESOPHAGOGASTRODUODENOSCOPY N/A 10/01/2019   Procedure: ESOPHAGOGASTRODUODENOSCOPY (EGD);  Surgeon: Toledo,  Boykin Nearing, MD;  Location: [facility] ENDOSCOPY;  Service: Gastroenterology;  Laterality: N/A;  . KNEE SURGERY      Prior to Admission medications   Medication Sig Start Date End Date Taking? Authorizing Provider  amoxicillin (AMOXIL) 500 MG capsule Take 500 mg by mouth 2 (two) times daily.    [provider]  atorvastatin (LIPITOR) 40 MG tablet Take 40 mg by mouth daily.    [provider]  ferrous fumarate-b12-vitamic C-folic acid (TRINSICON / FOLTRIN) capsule Take 1 capsule by mouth 2 (two) times daily after a meal. 10/02/19   Rolly Salter, MD  furosemide (LASIX) 20 MG tablet Take 20 mg by mouth daily.    [provider]  losartan (COZAAR) 50 MG tablet Take 50 mg by mouth daily.    [provider]  metFORMIN (GLUCOPHAGE-XR) 500 MG 24 hr tablet Take 500 mg by mouth daily.    [provider]  pantoprazole (PROTONIX) 40 MG tablet Take 1 tablet (40 mg total) by mouth 2 (two) times daily before a meal. 10/02/19   Rolly Salter, MD  polyethylene glycol (MIRALAX / GLYCOLAX) 17 g packet Take 17 g by mouth daily. 10/03/19   Rolly Salter, MD    Allergies Patient has no known allergies.  Family History  Problem Relation Age of Onset  . Diabetes Mellitus II Mother     Social History Social History   Tobacco Use  . Smoking status: Never Smoker  . Smokeless tobacco: Never Used  Substance Use Topics  . Alcohol use: Yes    Alcohol/week: 2.0 standard drinks    Types: 2 Cans of beer per  week    Comment: occ  . Drug use: No     Review of Systems  Constitutional: Positive fever/chills Eyes: No visual changes. No discharge ENT: No upper respiratory complaints. Cardiovascular: no chest pain. Respiratory: no cough. No SOB. Gastrointestinal: No abdominal pain.  No nausea, no vomiting.  No diarrhea.  No constipation. Genitourinary: Positive for dysuria. No hematuria Musculoskeletal: Negative for musculoskeletal pain. Skin: Negative for rash,  abrasions, lacerations, ecchymosis. Neurological: Negative for headaches, focal weakness or numbness.  10 System ROS otherwise negative.  ____________________________________________   PHYSICAL EXAM:  VITAL SIGNS: ED Triage Vitals  Enc Vitals Group     BP 07/23/20 1314 121/64     Pulse Rate 07/23/20 1314 92     Resp 07/23/20 1314 16     Temp 07/23/20 1317 (!) 103.2 F (39.6 C)     Temp Source 07/23/20 1317 Oral     SpO2 07/23/20 1314 95 %     Weight 07/23/20 1314 228 lb (103.4 kg)     Height 07/23/20 1314 5\' 4"  (1.626 m)     Head Circumference --      Peak Flow --      Pain Score 07/23/20 1314 8     Pain Loc --      Pain Edu? --      Excl. in GC? --      Constitutional: Alert and oriented. Well appearing and in no acute distress. Eyes: Conjunctivae are normal. PERRL. EOMI. Head: Atraumatic. ENT:      Ears:       Nose: No congestion/rhinnorhea.      Mouth/Throat: Mucous membranes are moist.  Neck: No stridor.    Cardiovascular: Normal rate, regular rhythm. Normal S1 and S2.  Good peripheral circulation. Respiratory: Normal respiratory effort without tachypnea or retractions. Lungs CTAB. Good air entry to the bases with no decreased or absent breath sounds. Gastrointestinal: Bowel sounds 4 quadrants. Soft and nontender to palpation. No guarding or rigidity. No palpable masses. No distention.  Positive for bilateral CVA tenderness. Musculoskeletal: Full range of motion to all extremities. No gross deformities appreciated. Neurologic:  Normal speech and language. No gross focal neurologic deficits are appreciated.  Skin:  Skin is warm, dry and intact. No rash noted. Psychiatric: Mood and affect are normal. Speech and behavior are normal. Patient exhibits appropriate insight and judgement.   ____________________________________________   LABS (all labs ordered are listed, but only abnormal results are displayed)  Labs Reviewed  CBC WITH DIFFERENTIAL/PLATELET -  Abnormal; Notable for the following components:      Result Value   Hemoglobin 12.7 (*)    HCT 38.6 (*)    Platelets 91 (*)    All other components within normal limits  URINALYSIS, COMPLETE (UACMP) WITH MICROSCOPIC - Abnormal; Notable for the following components:   Color, Urine AMBER (*)    APPearance HAZY (*)    Glucose, UA 150 (*)    Hgb urine dipstick MODERATE (*)    Ketones, ur 5 (*)    Protein, ur 30 (*)    Leukocytes,Ua SMALL (*)    Bacteria, UA FEW (*)    All other components within normal limits  BASIC METABOLIC PANEL - Abnormal; Notable for the following components:   Sodium 130 (*)    Glucose, Bld 238 (*)    Calcium 8.5 (*)    All other components within normal limits  URINE CULTURE  CULTURE, BLOOD (ROUTINE X 2)  CULTURE, BLOOD (ROUTINE X 2)  LACTIC  ACID, PLASMA   ____________________________________________  EKG   ____________________________________________  RADIOLOGY I personally viewed and evaluated these images as part of my medical decision making, as well as reviewing the written report by the radiologist.  ED Provider Interpretation: Perinephric stranding identified consistent with pyelonephritis.  No acute findings on lumbar spine to be concerned about osteomyelitis  CT L-SPINE NO CHARGE  Result Date: 07/23/2020 CLINICAL DATA:  Fever and painful urination for 2 days. Back pain. History of prior lumbar fusion. EXAM: CT LUMBAR SPINE WITHOUT CONTRAST TECHNIQUE: Multidetector CT imaging of the lumbar spine was performed without intravenous contrast administration. Multiplanar CT image reconstructions were also generated. COMPARISON:  None. FINDINGS: Segmentation: There are five lumbar type vertebral bodies. The last full intervertebral disc space is labeled L5-S1. Alignment: Normal overall alignment of the lumbar vertebral bodies. There is slight anterolisthesis of L4 compared L5 but there is solid interbody fusion at this level in the facets are also fused at  this level. Vertebrae: Superior endplate depression T12 is noted. This is of uncertain age. The lumbar vertebral bodies are intact. No acute fracture. Paraspinal and other soft tissues: No significant findings. Disc levels: T12-L1: No disc protrusions, spinal or foraminal stenosis. L1-2: Mild annular bulge with slight flattening of the ventral thecal sac but no significant spinal or foraminal stenosis. Mild/moderate facet disease. L2-3: Degenerative disc disease and diffuse bulging degenerated annulus in combination with short pedicles and advanced facet disease contributing to moderately severe to severe spinal and bilateral lateral recess stenosis. No significant foraminal stenosis. L3-4: Bulging degenerated annulus, short pedicles, advanced facet disease and ligamentum flavum thickening contributing to moderately severe to severe spinal and bilateral lateral recess stenosis. Mild foraminal stenosis also. L4-5: Interbody fusion changes are noted. Mild anterolisthesis of L4 with osteophytic spurring and advanced facet disease contributing to mild spinal and bilateral lateral recess stenosis. No significant foraminal stenosis. L5-S1: Advanced facet disease but no spinal or lateral recess stenosis. Mild left and moderate right foraminal stenosis mainly due to spurring changes. IMPRESSION: 1. Degenerative lumbar spondylosis with multilevel disc disease and advanced facet disease. 2. Moderately severe to severe spinal and bilateral lateral recess stenosis at L2-3 and L3-4. 3. Mild spinal and bilateral lateral recess stenosis at L4-5. 4. Interbody fusion changes at L4-5. 5. Mild left and moderate right foraminal stenosis at L5-S1 mainly due to spurring changes. 6. Age indeterminate compression deformity of T12. Aortic Atherosclerosis (ICD10-I70.0). Electronically Signed   By: Rudie Meyer M.D.   On: 07/23/2020 16:28   CT Renal Stone Study  Result Date: 07/23/2020 CLINICAL DATA:  Bilateral flank pain and hematuria.  EXAM: CT ABDOMEN AND PELVIS WITHOUT CONTRAST TECHNIQUE: Multidetector CT imaging of the abdomen and pelvis was performed following the standard protocol without IV contrast. COMPARISON:  None FINDINGS: Lower chest: The lung bases are clear. No pleural effusions or pulmonary lesions. The heart is normal in size. Prosthetic mitral and pulmonic valves are noted. No pericardial effusion. Hepatobiliary: Suspect cirrhotic changes involving the liver with an irregular liver contour, dilated hepatic fissures and right lobe atrophy. Suspect portal venous hypertension and portal venous collaterals. No obvious hepatic lesions. The gallbladder is unremarkable. No common bile duct dilatation. Pancreas: No mass, inflammation or ductal dilatation. Spleen: Borderline splenomegaly.  No splenic lesions. Adrenals/Urinary Tract: The adrenal glands are unremarkable. Perinephric interstitial changes around both kidneys could be acute or chronic. No renal calculi or hydroureteronephrosis. No obstructing ureteral calculi. No bladder calculi. No worrisome renal or bladder lesions are identified without contrast. Stomach/Bowel:  The stomach, duodenum, small bowel and colon are grossly normal. No acute inflammatory changes, mass lesions or obstructive findings. The terminal ileum and appendix are normal. Vascular/Lymphatic: Scattered atherosclerotic calcifications but no aortic aneurysm. No mesenteric or retroperitoneal mass or adenopathy. Small scattered lymph nodes are noted. Reproductive: The prostate gland is enlarged. The seminal vesicles are unremarkable. Other: No free pelvic fluid collections. Small scattered pelvic lymph nodes are noted. Musculoskeletal: No significant bony findings. No bone lesions or acute fractures. The T12 fracture appears remote. Interbody fusion changes noted at L4-5. IMPRESSION: 1. No renal, ureteral or bladder calculi or mass. 2. Perinephric interstitial changes around both kidneys could be acute or chronic.  Could not exclude the possibility of pyelonephritis. 3. Suspect cirrhotic changes involving the liver with portal venous hypertension, portal venous collaterals and borderline splenomegaly. No obvious hepatic lesions. 4. Enlarged prostate gland. 5. Small scattered retroperitoneal and pelvic lymph nodes which are indeterminate. A follow-up CT examination with contrast may be helpful for further evaluation. Electronically Signed   By: Rudie MeyerP.  Gallerani M.D.   On: 07/23/2020 16:40    ____________________________________________    PROCEDURES  Procedure(s) performed:    Procedures    Medications  acetaminophen (TYLENOL) tablet 650 mg (650 mg Oral Given 07/23/20 1321)  sodium chloride 0.9 % bolus 1,000 mL (0 mLs Intravenous Stopped 07/23/20 1733)  cefTRIAXone (ROCEPHIN) 1 g in sodium chloride 0.9 % 100 mL IVPB (0 g Intravenous Stopped 07/23/20 1625)     ____________________________________________   INITIAL IMPRESSION / ASSESSMENT AND PLAN / ED COURSE  Pertinent labs & imaging results that were available during my care of the patient were reviewed by me and considered in my medical decision making (see chart for details).  Review of the [place] CSRS was performed in accordance of the [facility] prior to dispensing any controlled drugs.           Patient's diagnosis is consistent with pyelonephritis.  Patient presented to the emergency department complaining of dysuria, polyuria, fevers, increased back pain x3 days.  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  The patient had fever, symptoms he was concerned presented to the emergency department.  Patient did endorse an increase and lower back pain with radiation into the legs but no saddle anesthesia, paresthesias or loss of bowel or bladder function.  Diffusely tender throughout the lumbar spine with bilateral CVA tenderness.  Patient was  febrile on arrival.  Given the urinary symptoms, increased back pain differential included pyelonephritis, infected nephrolithiasis, urinary tract infection, osteomyelitis.  Imaging revealed findings consistent with pyelonephritis which is consistent with symptoms and urinalysis.  No evidence of osteomyelitis within CT.  Given most likely differential was pyelonephritis, and with the patient's history of endocarditis with osteomyelitis I feel that patient would best be suited with inpatient mission for IV antibiotics.  Patient is agreeable with this plan.  We will consult the hospital service..     ____________________________________________  FINAL CLINICAL IMPRESSION(S) / ED DIAGNOSES  Final diagnoses:  Low back pain      NEW MEDICATIONS STARTED DURING THIS VISIT:  ED Discharge Orders    None          This chart was dictated using voice recognition software/Dragon. Despite best efforts to proofread, errors can occur which can change the meaning. Any change was purely unintentional.    Racheal PatchesCuthriell, Belvie Iribe D, PA-C 07/23/20 1757    Shaune PollackIsaacs, [place], MD 07/23/20  1851  

## 2020-07-23 NOTE — ED Triage Notes (Signed)
Pt to ED via POV for lower back, bilateral leg, and bilateral knee pain. Pt states this started on Thursday, denies injury or fall.

## 2020-07-24 ENCOUNTER — Inpatient Hospital Stay (HOSPITAL_COMMUNITY)
Admit: 2020-07-24 | Discharge: 2020-07-24 | Disposition: A | Discharge status: Home / Self Care | Payer: Medicare Other | Attending: Internal Medicine | Admitting: Internal Medicine

## 2020-07-24 ENCOUNTER — Observation Stay: Payer: Medicare Other

## 2020-07-24 DIAGNOSIS — Z20822 Contact with and (suspected) exposure to covid-19: Secondary | ICD-10-CM | POA: Diagnosis present

## 2020-07-24 DIAGNOSIS — Z953 Presence of xenogenic heart valve: Secondary | ICD-10-CM | POA: Diagnosis not present

## 2020-07-24 DIAGNOSIS — E1129 Type 2 diabetes mellitus with other diabetic kidney complication: Secondary | ICD-10-CM | POA: Diagnosis present

## 2020-07-24 DIAGNOSIS — R809 Proteinuria, unspecified: Secondary | ICD-10-CM | POA: Diagnosis not present

## 2020-07-24 DIAGNOSIS — I38 Endocarditis, valve unspecified: Secondary | ICD-10-CM | POA: Diagnosis not present

## 2020-07-24 DIAGNOSIS — R6510 Systemic inflammatory response syndrome (SIRS) of non-infectious origin without acute organ dysfunction: Secondary | ICD-10-CM

## 2020-07-24 DIAGNOSIS — N12 Tubulo-interstitial nephritis, not specified as acute or chronic: Secondary | ICD-10-CM

## 2020-07-24 DIAGNOSIS — E1165 Type 2 diabetes mellitus with hyperglycemia: Secondary | ICD-10-CM | POA: Diagnosis present

## 2020-07-24 DIAGNOSIS — Z79899 Other long term (current) drug therapy: Secondary | ICD-10-CM | POA: Diagnosis not present

## 2020-07-24 DIAGNOSIS — N400 Enlarged prostate without lower urinary tract symptoms: Secondary | ICD-10-CM | POA: Diagnosis present

## 2020-07-24 DIAGNOSIS — K219 Gastro-esophageal reflux disease without esophagitis: Secondary | ICD-10-CM | POA: Diagnosis present

## 2020-07-24 DIAGNOSIS — E669 Obesity, unspecified: Secondary | ICD-10-CM | POA: Diagnosis present

## 2020-07-24 DIAGNOSIS — E871 Hypo-osmolality and hyponatremia: Secondary | ICD-10-CM | POA: Diagnosis present

## 2020-07-24 DIAGNOSIS — I10 Essential (primary) hypertension: Secondary | ICD-10-CM | POA: Diagnosis present

## 2020-07-24 DIAGNOSIS — E785 Hyperlipidemia, unspecified: Secondary | ICD-10-CM | POA: Diagnosis present

## 2020-07-24 DIAGNOSIS — N390 Urinary tract infection, site not specified: Secondary | ICD-10-CM

## 2020-07-24 DIAGNOSIS — R509 Fever, unspecified: Secondary | ICD-10-CM | POA: Diagnosis not present

## 2020-07-24 DIAGNOSIS — D696 Thrombocytopenia, unspecified: Secondary | ICD-10-CM | POA: Diagnosis present

## 2020-07-24 DIAGNOSIS — M545 Low back pain, unspecified: Secondary | ICD-10-CM | POA: Diagnosis present

## 2020-07-24 DIAGNOSIS — G8929 Other chronic pain: Secondary | ICD-10-CM | POA: Diagnosis present

## 2020-07-24 DIAGNOSIS — Z952 Presence of prosthetic heart valve: Secondary | ICD-10-CM

## 2020-07-24 DIAGNOSIS — M25561 Pain in right knee: Secondary | ICD-10-CM | POA: Diagnosis present

## 2020-07-24 DIAGNOSIS — Z6836 Body mass index (BMI) 36.0-36.9, adult: Secondary | ICD-10-CM | POA: Diagnosis not present

## 2020-07-24 DIAGNOSIS — Z833 Family history of diabetes mellitus: Secondary | ICD-10-CM | POA: Diagnosis not present

## 2020-07-24 DIAGNOSIS — A419 Sepsis, unspecified organism: Secondary | ICD-10-CM | POA: Diagnosis present

## 2020-07-24 DIAGNOSIS — Z7984 Long term (current) use of oral hypoglycemic drugs: Secondary | ICD-10-CM | POA: Diagnosis not present

## 2020-07-24 DIAGNOSIS — K766 Portal hypertension: Secondary | ICD-10-CM | POA: Diagnosis present

## 2020-07-24 DIAGNOSIS — Z8679 Personal history of other diseases of the circulatory system: Secondary | ICD-10-CM | POA: Diagnosis not present

## 2020-07-24 DIAGNOSIS — M25562 Pain in left knee: Secondary | ICD-10-CM | POA: Diagnosis present

## 2020-07-24 LAB — SARS CORONAVIRUS 2 (TAT 6-24 HRS): SARS Coronavirus 2: NEGATIVE

## 2020-07-24 LAB — HEMOGLOBIN A1C
Hgb A1c MFr Bld: 7.1 % — ABNORMAL HIGH (ref 4.8–5.6)
Mean Plasma Glucose: 157.07 mg/dL

## 2020-07-24 LAB — CK: Total CK: 456 U/L — ABNORMAL HIGH (ref 49–397)

## 2020-07-24 LAB — URINE CULTURE: Culture: <10000 — AB

## 2020-07-24 LAB — SEDIMENTATION RATE: Sed Rate: 62 mm/hr — ABNORMAL HIGH (ref 0–20)

## 2020-07-24 LAB — GLUCOSE, CAPILLARY
Glucose-Capillary: 185 mg/dL — ABNORMAL HIGH (ref 70–99)
Glucose-Capillary: 187 mg/dL — ABNORMAL HIGH (ref 70–99)
Glucose-Capillary: 172 mg/dL — ABNORMAL HIGH (ref 70–99)

## 2020-07-24 LAB — C-REACTIVE PROTEIN: CRP: 14.1 mg/dL — ABNORMAL HIGH (ref <1.0)

## 2020-07-24 LAB — PROTIME-INR
INR: 1.1 (ref 0.8–1.2)
Prothrombin Time: 13.3 seconds (ref 11.4–15.2)

## 2020-07-24 MED ORDER — INSULIN ASPART 100 UNIT/ML ~~LOC~~ SOLN
0.0000 [IU] | Freq: Three times a day (TID) | SUBCUTANEOUS | Status: DC
  Administered 2020-07-24 – 2020-07-25 (×3): 2 [IU] via SUBCUTANEOUS
  Administered 2020-07-25: 3 [IU] via SUBCUTANEOUS
  Filled 2020-07-24 (×4): qty 1

## 2020-07-24 MED ORDER — GADOBUTROL 1 MMOL/ML IV SOLN
9.0000 mL | Freq: Once | INTRAVENOUS | Status: AC | PRN
  Administered 2020-07-24: 9 mL via INTRAVENOUS

## 2020-07-24 MED ORDER — SODIUM CHLORIDE 0.9 % IV SOLN
2.0000 g | INTRAVENOUS | Status: DC
  Administered 2020-07-25: 2 g via INTRAVENOUS
  Filled 2020-07-24: qty 20
  Filled 2020-07-24: qty 2

## 2020-07-24 NOTE — Consult Note (Signed)
NAME: Joseph Raymond  DOB: 01-27-1961  MRN: [id number]  Date/Time: 07/24/2020 10:45 AM  REQUESTING PROVIDER: Dr. Sedalia Muta Subjective:  REASON FOR CONSULT: Fever with history of endocarditis and osteomyelitis ? Joseph Raymond is a 60 y.o. 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  Tricuspid valve annuloplasty July 2018. 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  Questionable vegetation on TEE status post 6 weeks of IV ceftriaxone and now on p.o. amoxicillin suppression, colonoscopy adenomatous polyp, followed at [facility].  Last seen in the ID clinic on 06/27/2020 with recommendation for continuing suppressive therapy indefinitely amoxicillin 500 mg p.o. 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  He also had dysuria, polyuria and worsening pain In the ED temperature 103.2, BP 121/64, pulse rate 92, respiratory rate 16, sats 95% on room air. WBC 8.9, Hb 12.7, platelet 91, creatinine 0.87. CT of the renal showed perinephric interstitial changes around both kidneys which could be either acute or chronic.  Inspection for pyelonephritis.  Also cirrhotic changes involving the liver with portal venous hypertension, portal venous collaterals and borderline splenomegaly.  Enlarged prostate gland.  He was given a dose of ceftriaxone which was then switched to meropenem because of him being on amoxicillin and risk for multidrug-resistant organism. Blood cultures have been sent.  I am asked to see the patient because of his complicated infectious disease history. Patient states he is feeling better.  Past Medical History:  Diagnosis Date  . Chronic back pain   . Diabetes mellitus without complication (HCC)   . Hypertension   . Lumbar radiculopathy     Past Surgical History:   Procedure Laterality Date  . ESOPHAGOGASTRODUODENOSCOPY N/A 10/01/2019   Procedure: ESOPHAGOGASTRODUODENOSCOPY (EGD);  Surgeon: Toledo, Boykin Nearing, MD;  Location: [facility] ENDOSCOPY;  Service: Gastroenterology;  Laterality: N/A;  . KNEE SURGERY      Social History   Socioeconomic History  . Marital status: Single    Spouse name: Not on file  . Number of children: Not on file  . Years of education: Not on file  . Highest education level: Not on file  Occupational History  . Not on file  Tobacco Use  . Smoking status: Never Smoker  . Smokeless tobacco: Never Used  Substance and Sexual Activity  . Alcohol use: Yes    Alcohol/week: 2.0 standard drinks    Types: 2 Cans of beer per week    Comment: occ  . Drug use: No  . Sexual activity: Not on file  Other Topics Concern  . Not on file  Social History Narrative  . Not on file   Social Determinants of Health   Financial Resource Strain: Not on file  Food Insecurity: Not on file  Transportation Needs: Not on file  Physical Activity: Not on file  Stress: Not on file  Social Connections: Not on file  Intimate Partner Violence: Not on file    Family History  Problem Relation Age of Onset  . Diabetes Mellitus II Mother    No Known Allergies I? Current Facility-Administered Medications  Medication Dose Route Frequency Provider Last Rate Last Admin  . acetaminophen (TYLENOL) tablet 1,000 mg  1,000 mg Oral Q6H PRN Manuela Schwartz, NP   1,000 mg at 07/23/20 2127  . atorvastatin (LIPITOR) tablet 40 mg  40 mg Oral Daily Cox, Amy N, DO  40 mg at 07/24/20 0846  . insulin aspart (novoLOG) injection 0-9 Units  0-9 Units Subcutaneous TID WC Marrion Coy, MD      . losartan (COZAAR) tablet 50 mg  50 mg Oral Daily Cox, Amy N, DO   50 mg at 07/24/20 0845  . meropenem (MERREM) 1 g in sodium chloride 0.9 % 100 mL IVPB  1 g Intravenous Q8H Cox, Amy N, DO 200 mL/hr at 07/24/20 0512 1 g at 07/24/20 0512  . pantoprazole (PROTONIX) EC tablet 40 mg   40 mg Oral Daily Cox, Amy N, DO   40 mg at 07/24/20 0845     Abtx:  Anti-infectives (From admission, onward)   Start     Dose/Rate Route Frequency Ordered Stop   07/23/20 2200  meropenem (MERREM) 1 g in sodium chloride 0.9 % 100 mL IVPB        1 g 200 mL/hr over 30 Minutes Intravenous Every 8 hours 07/23/20 1948     07/23/20 2100  levofloxacin (LEVAQUIN) IVPB 750 mg  Status:  Discontinued        750 mg 100 mL/hr over 90 Minutes Intravenous Every 24 hours 07/23/20 1907 07/23/20 1942   07/23/20 1545  cefTRIAXone (ROCEPHIN) 1 g in sodium chloride 0.9 % 100 mL IVPB        1 g 200 mL/hr over 30 Minutes Intravenous  Once 07/23/20 1543 07/23/20 1625      REVIEW OF SYSTEMS:  Const: negative fever, ++ chills, negative weight loss Eyes: negative diplopia or visual changes, negative eye pain ENT: negative coryza, negative sore throat Resp: negative cough, hemoptysis, dyspnea Cards: negative for chest pain, palpitations, lower extremity edema GU: ++ frequency, dysuria no hematuria GI: Negative for abdominal pain, diarrhea, bleeding, constipation Skin: negative for rash and pruritus Heme: negative for easy bruising and gum/nose bleeding MS: Low back pain and pain radiating to his legs Neurolo:negative for headaches, dizziness, vertigo, memory problems  Psych: negative for feelings of anxiety, depression  Endocrine: negative for thyroid, diabetes Allergy/Immunology- negative for any medication or food allergies ?  Objective:  VITALS:  BP 127/76   Pulse 72   Temp (!) 100.4 F (38 C) (Oral)   Resp 16   Ht 5\' 4"  (1.626 m)   Wt 95.2 kg   SpO2 98%   BMI 36.03 kg/m  PHYSICAL EXAM:  General: Alert, cooperative, no distress, appears stated age.  Obese Head: Normocephalic, without obvious abnormality, atraumatic. Eyes: Conjunctivae clear, anicteric sclerae. Pupils are equal ENT Nares normal. No drainage or sinus tenderness. Lips, mucosa, and tongue normal. No Thrush Neck: Supple,  symmetrical, no adenopathy, thyroid: non tender no carotid bruit and no JVD. Back: No CVA tenderness. Lungs: Clear to auscultation bilaterally. No Wheezing or Rhonchi. No rales. Sternal scar Heart: Regular rate and rhythm, no murmur, rub or gallop. Abdomen: Soft, non-tender,not distended. Bowel sounds normal. No masses Extremities: atraumatic, no cyanosis. No edema. No clubbing Skin: No rashes or lesions. Or bruising Lymph: Cervical, supraclavicular normal. Neurologic: Grossly non-focal Pertinent Labs Lab Results CBC    Component Value Date/Time   WBC 8.9 07/23/2020 1322   RBC 4.33 07/23/2020 1322   HGB 12.7 (L) 07/23/2020 1322   HCT 38.6 (L) 07/23/2020 1322   PLT 91 (L) 07/23/2020 1322   MCV 89.1 07/23/2020 1322   MCH 29.3 07/23/2020 1322   MCHC 32.9 07/23/2020 1322   RDW 14.9 07/23/2020 1322   LYMPHSABS 0.9 07/23/2020 1322   MONOABS 0.6 07/23/2020 1322   EOSABS  0.0 07/23/2020 1322   BASOSABS 0.0 07/23/2020 1322    CMP Latest Ref Rng & Units 07/23/2020 09/30/2019 09/29/2019  Glucose 70 - 99 mg/dL 474(Q238(H) 595(G153(H) 387(F131(H)  BUN 8 - 23 mg/dL 15 18 20   Creatinine 0.61 - 1.24 mg/dL 6.430.87 3.290.63 5.180.61  Sodium 135 - 145 mmol/L 130(L) 141 141  Potassium 3.5 - 5.1 mmol/L 3.9 4.0 3.9  Chloride 98 - 111 mmol/L 99 109 110  CO2 22 - 32 mmol/L 22 27 26   Calcium 8.9 - 10.3 mg/dL 8.4(Z8.5(L) 6.6(A8.4(L) 6.3(K8.2(L)  Total Protein 6.5 - 8.1 g/dL - - 5.7(L)  Total Bilirubin 0.3 - 1.2 mg/dL - - 1.7(H)  Alkaline Phos 38 - 126 U/L - - 48  AST 15 - 41 U/L - - 22  ALT 0 - 44 U/L - - 21      Microbiology: Recent Results (from the past 240 hour(s))  CULTURE, BLOOD (ROUTINE X 2) w Reflex to ID Panel     Status: None (Preliminary result)   Collection Time: 07/23/20  5:51 PM   Specimen: BLOOD  Result Value Ref Range Status   Specimen Description BLOOD LEFT ANTECUBITAL  Final   Special Requests   Final    BOTTLES DRAWN AEROBIC AND ANAEROBIC Blood Culture adequate volume   Culture   Final    NO GROWTH < 12  HOURS Performed at [facility], [address] [place], [place] [address]    Report Status PENDING  Incomplete  CULTURE, BLOOD (ROUTINE X 2) w Reflex to ID Panel     Status: None (Preliminary result)   Collection Time: 07/23/20  5:51 PM   Specimen: BLOOD  Result Value Ref Range Status   Specimen Description BLOOD RIGHT ANTECUBITAL  Final   Special Requests   Final    BOTTLES DRAWN AEROBIC AND ANAEROBIC Blood Culture adequate volume   Culture   Final    NO GROWTH < 12 HOURS Performed at [facility], [address] [place], [place] [address]    Report Status PENDING  Incomplete  SARS CORONAVIRUS 2 (TAT 6-24 HRS) Nasopharyngeal Nasopharyngeal Swab     Status: None   Collection Time: 07/23/20  8:38 PM   Specimen: Nasopharyngeal Swab  Result Value Ref Range Status   SARS Coronavirus 2 NEGATIVE NEGATIVE Final    Comment: (NOTE) SARS-CoV-2 target nucleic acids are NOT DETECTED.  The SARS-CoV-2 RNA is generally detectable in upper and lower respiratory specimens during the acute phase of infection. Negative results do not preclude SARS-CoV-2 infection, do not rule out co-infections with other pathogens, and should not be used as the sole basis for treatment or other patient management decisions. Negative results must be combined with clinical observations, patient history, and epidemiological information. The expected result is Negative.  Fact Sheet for Patients: HairSlick.nohttps://www.fda.gov/media/138098/download  Fact Sheet for Healthcare Providers: quierodirigir.comhttps://www.fda.gov/media/138095/download  This test is not yet approved or cleared by the [place] FDA and  has been authorized for detection and/or diagnosis of SARS-CoV-2 by FDA under an Emergency Use Authorization (EUA). This EUA will remain  in effect (meaning this test can be used) for the duration of the COVID-19 declaration under Se ction 564(b)(1) of the Act, 21 U.S.C. section 360bbb-3(b)(1), unless the  authorization is terminated or revoked sooner.  Performed at [facility], [address] [place], [place] [address]     IMAGING RESULTS: IMPRESSION: 1. No acute or inflammatory process in the lumbar spine. Suspect healed discitis osteomyelitis at L5-S1 where  there is spondylolisthesis and ankylosis. 2. Advanced disc and posterior element degeneration at L2-L3 and L3-L4 with moderate to severe spinal with severe stenosis at the descending and exiting right L3 nerve level. 3. Bulky right foraminal disc at L5-S1. Up to severe right and moderate left L5 foraminal stenosis. No acute or inflammatory process identified in the thoracic spine. Sequelae of healed T11-T12 discitis osteomyelitis with no adverse features.  I have personally reviewed the films ? Impression/Recommendation ? ?Fever ? UTI.  CT scan showed perinephric interstitial changes around both kidneys.  Hence pyelonephritis was questioned.?other source Urine cultures no growth Also has an enlarged prostate gland.  So we will get a post void bladder scan to look at residue. No pneumonia Low back pain.  MRI does not reveal any discitis or vertebral osteomyelitis. Currently on meropenem Blood culture Neg so far- will de-escalate to ceftriaxone  Low back pain with radiation down the legs.  MRI of thoracic and lumbar vertebrae shows no acute or inflammatory process in the lumbar spine or thoracic spine.  Suspect healed discitis at L5-S1. Advanced disc and posterior element degeneration at L2-L3 and L3-L4.  History of Enterococcus bacteremia with endocarditis and osteomyelitis in 2018 Underwent mitral valve replacement and aortic valve replacement and tricuspid annuloplasty  H/O Lumbar osteomyelitis  History of Streptococcus bovis bacteremia and prosthetic mitral valve endocarditis in 2020 followed by 6 weeks of ceftriaxone and now on oral amoxicillin suppressive therapy. ? CT abdomen shows cirrhotic changes involving  the liver with portal venous hypertension, portal venous collaterals and borderline splenomegaly.  ___________________________________________________ Discussed with patient, requesting provider Note:  This document was prepared using Dragon voice recognition software and may include unintentional dictation errors.

## 2020-07-24 NOTE — Progress Notes (Signed)
*  PRELIMINARY RESULTS* Echocardiogram 2D Echocardiogram has been performed.  Neita Garnet Presly Steinruck 07/24/2020, 7:22 PM

## 2020-07-24 NOTE — Progress Notes (Signed)
PROGRESS NOTE    Olney Monier  ULA:[id number] DOB: 20-Mar-1961 DOA: 07/23/2020 PCP: [facility], [facility]   Chief complaint.  Fever and back pain. Brief Narrative:  Joseph Raymond is a 60 y.o. 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  He presents to the emergency department for chief concerns of worsening bilateral low back pain with a high fever for duration of 4 days. He had MRI of lumbar and thoracic spine performed on 3/13, showed no evidence of discitis or osteomyelitis.  Assessment & Plan:   Active Problems:   H/O mechanical aortic valve replacement   HTN (hypertension)   HLD (hyperlipidemia)   Type II diabetes mellitus with renal manifestations (HCC)   Pyelonephritis  #1.  Sepsis VS SIRS Likely pyelonephritis. Biosynthetic mitral and aortic valve. Patient met sepsis criteria at time of admission with heart rate of 92 and temperature 103.  He does not have lactic acidosis.  He has elevation of procalcitonin level at 0.63.  Etiology most likely is a pyelonephritis. Blood culture sent out, will follow results.  Patient did have history of multiple septicemia and endocarditis episode in the past.  Currently he has a heart murmur, I will obtain TTE.  Will perform TEE if blood cultures came back positive. She is currently covered with meropenem, will continue for now. MRI of the lumbar and thoracic spine were negative for discitis or osteomyelitis. CT  of abdomen/pelvis has some perinephrotic interstitial changes, may indicate pyelonephritis.  #2. chronic thrombocytopenia. Continue to follow.  3.  Essential hypertension. Continue home medicines per  4.  Type 2 diabetes uncontrolled with hyperglycemia Discontinue Metformin, continue sliding scale insulin. We will add Lantus if glucose continues to run high.  Check hemoglobin A1c.  #5 hyponatremia. Follow.     DVT prophylaxis: SCDs Code Status: Full Family Communication:  Disposition Plan:  .   Status is: Observation  The patient will require care spanning > 2 midnights and should be moved to inpatient because: Inpatient level of care appropriate due to severity of illness  Dispo: The patient is from: Home              Anticipated d/c is to: Home              Patient currently is not medically stable to d/c.   Difficult to place patient No        I/O last 3 completed shifts: In: 340 [P.O.:240; IV Piggyback:100] Out: 700 [Urine:700] Total I/O In: 360 [P.O.:360] Out: -      Consultants:   ID  Procedures: None  Antimicrobials: Meropenem  Subjective: Seen patient with partial assist from virtual Spanish interpreter, patient was able to understand English. Patient still has mild lower back pain, but much improved than yesterday. Had a fever last night, with some chills per Denies any short of breath or cough. No abdominal pain or nausea vomiting No dysuria hematuria.  Objective: Vitals:   07/24/20 0237 07/24/20 0423 07/24/20 0510 07/24/20 0835  BP: (!) 117/58 (!) 148/133 111/68 127/76  Pulse: (!) 57 (!) 55 (!) 59 72  Resp: 14 16  16  Temp: 98.4 F (36.9 C) 98.6 F (37 C)  (!) 100.4 F (38 C)  TempSrc:  Oral  Oral  SpO2: 100% 96%  98%  Weight:      Height:        Intake/Output Summary (Last 24 hours) at 07/24/2020 1011 Last data filed at 07/24/2020 1001 Gross per 24 hour  Intake 700 ml  Output 700 ml  Net 0 ml   Filed Weights   07/23/20  1314 07/23/20 2117  Weight: 103.4 kg 95.2 kg    Examination:  General exam: Appears calm and comfortable  Respiratory system: Clear to auscultation. Respiratory effort normal. Cardiovascular system: Regular,. 2/6 systolic murmurs at RUSB and apex,  No pedal edema. Gastrointestinal system: Abdomen is nondistended, soft and nontender. No organomegaly or masses felt. Normal bowel sounds heard. Central nervous system: Alert and oriented. No focal neurological deficits. Extremities: Symmetric 5 x 5 power. Skin: No rashes, lesions or ulcers Psychiatry: Judgement and insight appear normal. Mood & affect appropriate.     Data Reviewed: I have personally reviewed following labs and imaging studies  CBC: Recent Labs  Lab 07/23/20 1322  WBC 8.9  NEUTROABS 7.4  HGB 12.7*  HCT 38.6*  MCV 89.1  PLT 91*   Basic Metabolic Panel: Recent Labs  Lab 07/23/20 1322  NA 130*  K 3.9  CL 99  CO2 22  GLUCOSE 238*  BUN 15  CREATININE 0.87  CALCIUM 8.5*   GFR: Estimated Creatinine Clearance: 92.8 mL/min (by C-G formula based on SCr of 0.87 mg/dL). Liver Function Tests: No results for input(s): AST, ALT, ALKPHOS, BILITOT, PROT, ALBUMIN in the last 168 hours. No results for input(s): LIPASE, AMYLASE in the last 168 hours. No results for input(s): AMMONIA in the last 168 hours. Coagulation Profile: Recent Labs  Lab 07/24/20 0451  INR 1.1   Cardiac Enzymes: Recent Labs  Lab 07/23/20 1927 07/24/20 0451  CKTOTAL 448* 456*   BNP (last 3 results) No results for input(s): PROBNP in the last 8760 hours. HbA1C: No results for input(s): HGBA1C in the last 72 hours. CBG: No results for input(s): GLUCAP in the last 168 hours. Lipid Profile: No results for input(s): CHOL, HDL, LDLCALC, TRIG, CHOLHDL, LDLDIRECT in the last 72 hours. Thyroid Function Tests: Recent Labs    07/23/20 1838  TSH 0.278*   Anemia Panel: No results for input(s): VITAMINB12, FOLATE, FERRITIN, TIBC, IRON,  RETICCTPCT in the last 72 hours. Sepsis Labs: Recent Labs  Lab 07/23/20 1542 07/23/20 1855  PROCALCITON  --  0.63  LATICACIDVEN 1.3  --     Recent Results (from the past 240 hour(s))  CULTURE, BLOOD (ROUTINE X 2) w Reflex to ID Panel     Status: None (Preliminary result)   Collection Time: 07/23/20  5:51 PM   Specimen: BLOOD  Result Value Ref Range Status   Specimen Description BLOOD LEFT ANTECUBITAL  Final   Special Requests   Final    BOTTLES DRAWN AEROBIC AND ANAEROBIC Blood Culture adequate volume   Culture   Final    NO GROWTH < 12 HOURS Performed at [facility], [address] [place], [place] [address]    Report Status PENDING  Incomplete  CULTURE, BLOOD (ROUTINE X 2) w Reflex to ID Panel     Status: None (Preliminary result)   Collection Time: 07/23/20  5:51 PM   Specimen: BLOOD  Result Value Ref Range Status   Specimen Description BLOOD RIGHT ANTECUBITAL  Final   Special Requests  Final    BOTTLES DRAWN AEROBIC AND ANAEROBIC Blood Culture adequate volume   Culture   Final    NO GROWTH < 12 HOURS Performed at [facility], [place], [place] [address]    Report Status PENDING  Incomplete  SARS CORONAVIRUS 2 (TAT 6-24 HRS) Nasopharyngeal Nasopharyngeal Swab     Status: None   Collection Time: 07/23/20  8:38 PM   Specimen: Nasopharyngeal Swab  Result Value Ref Range Status   SARS Coronavirus 2 NEGATIVE NEGATIVE Final    Comment: (NOTE) SARS-CoV-2 target nucleic acids are NOT DETECTED.  The SARS-CoV-2 RNA is generally detectable in upper and lower respiratory specimens during the acute phase of infection. Negative results do not preclude SARS-CoV-2 infection, do not rule out co-infections with other pathogens, and should not be used as the sole basis for treatment or other patient management decisions. Negative results must be combined with clinical observations, patient history, and epidemiological information. The  expected result is Negative.  Fact Sheet for Patients: SugarRoll.be  Fact Sheet for Healthcare Providers: https://www.woods-mathews.com/  This test is not yet approved or cleared by the [place] FDA and  has been authorized for detection and/or diagnosis of SARS-CoV-2 by FDA under an Emergency Use Authorization (EUA). This EUA will remain  in effect (meaning this test can be used) for the duration of the COVID-19 declaration under Se ction 564(b)(1) of the Act, 21 U.S.C. section 360bbb-3(b)(1), unless the authorization is terminated or revoked sooner.  Performed at [facility], [place] [address] [place], [place] [address]          Radiology Studies: MR THORACIC SPINE W WO CONTRAST  Result Date: 07/24/2020 CLINICAL DATA:  60 year old male with fever and back pain. 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 EXAM: MRI THORACIC WITHOUT AND WITH CONTRAST TECHNIQUE: Multiplanar and multiecho pulse sequences of the thoracic spine were obtained without and with intravenous contrast. CONTRAST:  42m GADAVIST GADOBUTROL 1 MMOL/ML IV SOLN COMPARISON:  CT lumbar spine, CT Abdomen and Pelvis 07/23/2020. Chest CT 10/04/2006. MRI lumbar spine today reported separately. FINDINGS: Limited cervical spine imaging: Evidence of chronic disc and endplate degeneration but otherwise grossly negative. Thoracic spine segmentation: Appears to be normal, hypoplastic ribs suspected at T12. Same numbering system as on the recent CT. Alignment: Relatively preserved thoracic kyphosis. No spondylolisthesis. Vertebrae: No marrow edema or evidence of acute osseous abnormality. Mildly heterogeneous background T1 marrow signal but no suspicious enhancement. No acute osseous abnormality identified. Cord: No spinal cord signal abnormality identified despite some degenerative spinal stenosis detailed below. Conus  medullaris appears normal at T12-L1. No abnormal intradural enhancement.  No dural thickening. Paraspinal and other soft tissues: Negative. Disc levels: T1-T2: Mild facet hypertrophy.  No stenosis. T2-T3: Small right paracentral disc protrusion. No significant stenosis. T3-T4: Small central disc protrusion.  No significant stenosis. T4-T5: Large or central disc protrusion best seen on series 19, image 9. Mild spinal stenosis and mild cord mass effect. No foraminal involvement. T5-T6: Mild disc bulge and facet hypertrophy without stenosis. T6-T7: Right paracentral small disc protrusion. Mild facet hypertrophy. Borderline to mild spinal stenosis without cord mass effect (series 22 image 21). T7-T8: Moderate right paracentral disc or disc osteophyte complex. Mild facet hypertrophy. Mild spinal stenosis and cord mass effect (series 22, image 23). T8-T9: Similar left paracentral disc or disc osteophyte complex. Mild facet hypertrophy. Mild spinal stenosis and left hemi cord mass effect. Mild left T8 foraminal stenosis.  T9-T10: Mild disc bulging and facet hypertrophy greater on the left. No spinal stenosis. Moderate left T9 foraminal stenosis. T10-T11: Mild disc bulging eccentric to the right. Mild facet hypertrophy. Mild right T10 foraminal stenosis. T11-T12: Abnormal disc space and superior T12 endplate which are compatible with healed discitis osteomyelitis. There is a degree of interbody ankylosis noted far laterally on the right. No stenosis. T12-L1: Mild disc bulge.  No stenosis. IMPRESSION: 1. No acute or inflammatory process identified in the thoracic spine. Sequelae of healed T11-T12 discitis osteomyelitis with no adverse features. 2. Advanced thoracic disc disease. Multilevel mild thoracic spinal stenosis and spinal cord mass effect (T4-T5, T7-T8, T8-T9) but no spinal cord signal abnormality. Moderate degenerative neural foraminal stenosis at the left T9 nerve level. Electronically Signed   By: Genevie Ann M.D.    On: 07/24/2020 07:48   MR Lumbar Spine W Wo Contrast  Result Date: 07/24/2020 CLINICAL DATA:  60 year old male with fever and back pain. 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 EXAM: MRI LUMBAR SPINE WITHOUT AND WITH CONTRAST TECHNIQUE: Multiplanar and multiecho pulse sequences of the lumbar spine were obtained without and with intravenous contrast. CONTRAST:  86m GADAVIST GADOBUTROL 1 MMOL/ML IV SOLN in conjunction with contrast enhanced imaging of the thoracic spine reported separately. COMPARISON:  CT lumbar spine 07/23/2020. Thoracic spine today reported separately. FINDINGS: Segmentation: Normal, the same numbering system used on the recent CT. Alignment: Stable from the recent CT. Lumbar lordosis with grade 1 anterolisthesis of L4 on L5. Vertebrae: Chronically obliterated disc at L4-L5 with evidence of posterior element ankylosis, compatible with sequelae of healed discitis osteomyelitis. No marrow edema or evidence of acute osseous abnormality. No abnormal vertebral enhancement. Background bone marrow signal within normal limits. Intact visible sacrum and SI joints. Conus medullaris and cauda equina: Conus extends to the T12-L1 level. No lower spinal cord or conus signal abnormality. No abnormal intradural enhancement or dural thickening. Paraspinal and other soft tissues: Negative. Disc levels: T12-L1:  Reported with the thoracic spine today. L1-L2:  Minor disc bulging and facet hypertrophy.  No stenosis. L2-L3: Disc desiccation and disc space loss. Circumferential disc bulge and up to moderate posterior element hypertrophy. Moderate spinal and severe right lateral recess stenosis (right L3 nerve level). Mild L3 foraminal stenosis. L3-L4: Disc desiccation. Bulky circumferential disc bulge and up to moderate posterior element hypertrophy. Epidural lipomatosis. Moderate to severe spinal stenosis. Mild left but moderate to severe right L3 foraminal  stenosis. L4-L5: Chronic anterolisthesis and ankylosis. Osseous mild spinal stenosis, moderate right L4 foraminal stenosis. L5-S1: Far lateral disc bulging and endplate spurring. Additional bulky right foraminal to extraforaminal component of disc (series 28, image 32). Left foraminal annular fissure of the disc. Moderate facet hypertrophy. No spinal or lateral recess stenosis. Moderate left and moderate to severe right L5 foraminal stenosis. IMPRESSION: 1. No acute or inflammatory process in the lumbar spine. Suspect healed discitis osteomyelitis at L5-S1 where there is spondylolisthesis and ankylosis. 2. Advanced disc and posterior element degeneration at L2-L3 and L3-L4 with moderate to severe spinal with severe stenosis at the descending and exiting right L3 nerve level. 3. Bulky right foraminal disc at L5-S1. Up to severe right and moderate left L5 foraminal stenosis. Electronically Signed   By: HGenevie AnnM.D.   On: 07/24/2020 08:15   CT L-SPINE NO CHARGE  Result Date: 07/23/2020 CLINICAL DATA:  Fever and painful urination for 2 days. Back pain. History of prior lumbar fusion. EXAM: CT LUMBAR  SPINE WITHOUT CONTRAST TECHNIQUE: Multidetector CT imaging of the lumbar spine was performed without intravenous contrast administration. Multiplanar CT image reconstructions were also generated. COMPARISON:  None. FINDINGS: Segmentation: There are five lumbar type vertebral bodies. The last full intervertebral disc space is labeled L5-S1. Alignment: Normal overall alignment of the lumbar vertebral bodies. There is slight anterolisthesis of L4 compared L5 but there is solid interbody fusion at this level in the facets are also fused at this level. Vertebrae: Superior endplate depression J82 is noted. This is of uncertain age. The lumbar vertebral bodies are intact. No acute fracture. Paraspinal and other soft tissues: No significant findings. Disc levels: T12-L1: No disc protrusions, spinal or foraminal stenosis. L1-2:  Mild annular bulge with slight flattening of the ventral thecal sac but no significant spinal or foraminal stenosis. Mild/moderate facet disease. L2-3: Degenerative disc disease and diffuse bulging degenerated annulus in combination with short pedicles and advanced facet disease contributing to moderately severe to severe spinal and bilateral lateral recess stenosis. No significant foraminal stenosis. L3-4: Bulging degenerated annulus, short pedicles, advanced facet disease and ligamentum flavum thickening contributing to moderately severe to severe spinal and bilateral lateral recess stenosis. Mild foraminal stenosis also. L4-5: Interbody fusion changes are noted. Mild anterolisthesis of L4 with osteophytic spurring and advanced facet disease contributing to mild spinal and bilateral lateral recess stenosis. No significant foraminal stenosis. L5-S1: Advanced facet disease but no spinal or lateral recess stenosis. Mild left and moderate right foraminal stenosis mainly due to spurring changes. IMPRESSION: 1. Degenerative lumbar spondylosis with multilevel disc disease and advanced facet disease. 2. Moderately severe to severe spinal and bilateral lateral recess stenosis at L2-3 and L3-4. 3. Mild spinal and bilateral lateral recess stenosis at L4-5. 4. Interbody fusion changes at L4-5. 5. Mild left and moderate right foraminal stenosis at L5-S1 mainly due to spurring changes. 6. Age indeterminate compression deformity of T12. Aortic Atherosclerosis (ICD10-I70.0). Electronically Signed   By: Marijo Sanes M.D.   On: 07/23/2020 16:28   DG Chest Port 1 View  Result Date: 07/23/2020 CLINICAL DATA:  Fever EXAM: PORTABLE CHEST 1 VIEW COMPARISON:  10/04/2006 FINDINGS: Median sternotomy with cardiac valve replacement. Heart size is mildly enlarged. Mild pulmonary vascular congestion. No focal airspace consolidation, pleural effusion, or pneumothorax. IMPRESSION: Cardiomegaly and mild pulmonary vascular congestion. No  overt edema or focal airspace consolidation. Electronically Signed   By: Davina Poke D.O.   On: 07/23/2020 19:36   CT Renal Stone Study  Result Date: 07/23/2020 CLINICAL DATA:  Bilateral flank pain and hematuria. EXAM: CT ABDOMEN AND PELVIS WITHOUT CONTRAST TECHNIQUE: Multidetector CT imaging of the abdomen and pelvis was performed following the standard protocol without IV contrast. COMPARISON:  None FINDINGS: Lower chest: The lung bases are clear. No pleural effusions or pulmonary lesions. The heart is normal in size. Prosthetic mitral and pulmonic valves are noted. No pericardial effusion. Hepatobiliary: Suspect cirrhotic changes involving the liver with an irregular liver contour, dilated hepatic fissures and right lobe atrophy. Suspect portal venous hypertension and portal venous collaterals. No obvious hepatic lesions. The gallbladder is unremarkable. No common bile duct dilatation. Pancreas: No mass, inflammation or ductal dilatation. Spleen: Borderline splenomegaly.  No splenic lesions. Adrenals/Urinary Tract: The adrenal glands are unremarkable. Perinephric interstitial changes around both kidneys could be acute or chronic. No renal calculi or hydroureteronephrosis. No obstructing ureteral calculi. No bladder calculi. No worrisome renal or bladder lesions are identified without contrast. Stomach/Bowel: The stomach, duodenum, small bowel and colon are grossly normal. No acute  inflammatory changes, mass lesions or obstructive findings. The terminal ileum and appendix are normal. Vascular/Lymphatic: Scattered atherosclerotic calcifications but no aortic aneurysm. No mesenteric or retroperitoneal mass or adenopathy. Small scattered lymph nodes are noted. Reproductive: The prostate gland is enlarged. The seminal vesicles are unremarkable. Other: No free pelvic fluid collections. Small scattered pelvic lymph nodes are noted. Musculoskeletal: No significant bony findings. No bone lesions or acute  fractures. The T12 fracture appears remote. Interbody fusion changes noted at L4-5. IMPRESSION: 1. No renal, ureteral or bladder calculi or mass. 2. Perinephric interstitial changes around both kidneys could be acute or chronic. Could not exclude the possibility of pyelonephritis. 3. Suspect cirrhotic changes involving the liver with portal venous hypertension, portal venous collaterals and borderline splenomegaly. No obvious hepatic lesions. 4. Enlarged prostate gland. 5. Small scattered retroperitoneal and pelvic lymph nodes which are indeterminate. A follow-up CT examination with contrast may be helpful for further evaluation. Electronically Signed   By: Marijo Sanes M.D.   On: 07/23/2020 16:40        Scheduled Meds: . atorvastatin  40 mg Oral Daily  . losartan  50 mg Oral Daily  . [START ON 07/25/2020] metFORMIN  500 mg Oral Daily  . pantoprazole  40 mg Oral Daily   Continuous Infusions: . lactated ringers 125 mL/hr at 07/23/20 1915  . meropenem (MERREM) IV 1 g (07/24/20 0512)     LOS: 0 days    Time spent: 35 minutes    Sharen Hones, MD [facility]   To contact the attending provider between 7A-7P or the covering provider during after hours 7P-7A, please log into the web site [email or web address] and access using universal [place] password for that web site. If you do not have the password, please call the hospital operator.  07/24/2020, 10:11 AM

## 2020-07-25 DIAGNOSIS — R509 Fever, unspecified: Secondary | ICD-10-CM

## 2020-07-25 DIAGNOSIS — A419 Sepsis, unspecified organism: Principal | ICD-10-CM

## 2020-07-25 DIAGNOSIS — E871 Hypo-osmolality and hyponatremia: Secondary | ICD-10-CM

## 2020-07-25 LAB — ECHOCARDIOGRAM COMPLETE
AR max vel: 1.71 cm2
AV Area VTI: 1.82 cm2
AV Area mean vel: 1.83 cm2
AV Mean grad: 24 mmHg
AV Peak grad: 48.9 mmHg
Ao pk vel: 3.5 m/s
Area-P 1/2: 1.51 cm2
Height: 64 in
MV VTI: 1.23 cm2
S' Lateral: 3.3 cm
Weight: 3358.4 oz

## 2020-07-25 LAB — HEPATIC FUNCTION PANEL
ALT: 93 U/L — ABNORMAL HIGH (ref 0–44)
AST: 93 U/L — ABNORMAL HIGH (ref 15–41)
Albumin: 3.1 g/dL — ABNORMAL LOW (ref 3.5–5.0)
Alkaline Phosphatase: 55 U/L (ref 38–126)
Bilirubin, Direct: 0.2 mg/dL (ref 0.0–0.2)
Indirect Bilirubin: 0.5 mg/dL (ref 0.3–0.9)
Total Bilirubin: 0.7 mg/dL (ref 0.3–1.2)
Total Protein: 6.3 g/dL — ABNORMAL LOW (ref 6.5–8.1)

## 2020-07-25 LAB — GLUCOSE, CAPILLARY
Glucose-Capillary: 156 mg/dL — ABNORMAL HIGH (ref 70–99)
Glucose-Capillary: 116 mg/dL — ABNORMAL HIGH (ref 70–99)
Glucose-Capillary: 182 mg/dL — ABNORMAL HIGH (ref 70–99)
Glucose-Capillary: 225 mg/dL — ABNORMAL HIGH (ref 70–99)
Glucose-Capillary: 151 mg/dL — ABNORMAL HIGH (ref 70–99)

## 2020-07-25 MED ORDER — SULFAMETHOXAZOLE-TRIMETHOPRIM 800-160 MG PO TABS
1.0000 | ORAL_TABLET | Freq: Two times a day (BID) | ORAL | Status: DC
  Administered 2020-07-26: 1 via ORAL
  Filled 2020-07-25 (×2): qty 1

## 2020-07-25 MED ORDER — ACETAMINOPHEN 500 MG PO TABS
1000.0000 mg | ORAL_TABLET | Freq: Four times a day (QID) | ORAL | Status: DC | PRN
  Administered 2020-07-25: 1000 mg via ORAL

## 2020-07-25 MED ORDER — DIPHENHYDRAMINE HCL 50 MG/ML IJ SOLN
12.5000 mg | Freq: Once | INTRAMUSCULAR | Status: AC
  Administered 2020-07-25: 12.5 mg via INTRAVENOUS
  Filled 2020-07-25: qty 1

## 2020-07-25 NOTE — Progress Notes (Signed)
°   07/24/20 1157  Assess: MEWS Score  Temp (!) 101.7 F (38.7 C)  BP 120/68  Pulse Rate 67  Resp (!) 8  SpO2 96 %  Assess: MEWS Score  MEWS Temp 2  MEWS Systolic 0  MEWS Pulse 0  MEWS RR 1  MEWS LOC 0  MEWS Score 3  MEWS Score Color Yellow  Assess: if the MEWS score is Yellow or Red  Were vital signs taken at a resting state? Yes  Focused Assessment No change from prior assessment  Early Detection of Sepsis Score *See Row Information* Low  MEWS guidelines implemented *See Row Information* Yes  Treat  MEWS Interventions Administered prn meds/treatments  Pain Scale 0-10  Pain Score 0  Complains of Fever  Interventions Medication (see MAR)  Take Vital Signs  Increase Vital Sign Frequency  Yellow: Q 2hr X 2 then Q 4hr X 2, if remains yellow, continue Q 4hrs  Escalate  MEWS: Escalate Yellow: discuss with charge nurse/RN and consider discussing with provider and RRT  Notify: Charge Nurse/RN  Name of Charge Nurse/RN Notified Margaret Pyle  Date Charge Nurse/RN Notified 07/24/20  Time Charge Nurse/RN Notified 1203  Notify: Provider  Provider Name/Title zhang  Date Provider Notified 07/24/20  Time Provider Notified 1203  Notification Type Call  Notification Reason Other (Comment) (yellow mews)  Provider response No new orders  Date of Provider Response 07/24/20  Time of Provider Response 1204  Document  Patient Outcome Other (Comment) (tylenol)

## 2020-07-25 NOTE — Progress Notes (Signed)
Date of Admission:  07/23/2020      ID: Joseph Raymond is a 60 y.o. male  Active Problems:   H/O mechanical aortic valve replacement   HTN (hypertension)   HLD (hyperlipidemia)   Type II diabetes mellitus with renal manifestations (HCC)   Pyelonephritis   Hyponatremia   Sepsis (HCC)    Subjective: Doing well No fever No back pain No cough or sob  Medications:  . insulin aspart  0-9 Units Subcutaneous TID WC  . losartan  50 mg Oral Daily  . pantoprazole  40 mg Oral Daily    Objective: Vital signs in last 24 hours: Patient Vitals for the past 24 hrs:  BP Temp Temp src Pulse Resp SpO2  07/25/20 1535 106/68 98.8 F (37.1 C) Oral 65 15 99 %  07/25/20 1110 110/66 98.3 F (36.8 C) Oral (!) 55 17 96 %  07/25/20 0855 (!) 101/58 98.6 F (37 C) Oral (!) 55 20 98 %  07/25/20 0300 98/60 99.8 F (37.7 C) Oral - 20 -  07/25/20 0147 99/76 100.2 F (37.9 C) Oral 68 20 100 %  07/25/20 0100 - - - 65 20 -  07/24/20 2001 134/81 98.2 F (36.8 C) - 62 16 97 %   PHYSICAL EXAM:  General: Alert, cooperative, no distress, appears stated age.  Head: Normocephalic, without obvious abnormality, atraumatic. Eyes: Conjunctivae clear, anicteric sclerae. Pupils are equal ENT Nares normal. No drainage or sinus tenderness. Lips, mucosa, and tongue normal. No Thrush Neck: Supple, symmetrical, no adenopathy, thyroid: non tender no carotid bruit and no JVD. Back: No CVA tenderness. Lungs: Clear to auscultation bilaterally. No Wheezing or Rhonchi. No rales. Heart: s1s2 systolic murmur Sternal scar Abdomen: Soft, non-tender,not distended. Bowel sounds normal. No masses Extremities: atraumatic, no cyanosis. No edema. No clubbing Skin: No rashes or lesions. Or bruising Lymph: Cervical, supraclavicular normal. Neurologic: Grossly non-focal  Lab Results Recent Labs    07/23/20 1322  WBC 8.9  HGB 12.7*  HCT 38.6*  NA 130*  K 3.9  CL 99  CO2 22  BUN 15  CREATININE 0.87   Liver  Panel Recent Labs    07/25/20 0428  PROT 6.3*  ALBUMIN 3.1*  AST 93*  ALT 93*  ALKPHOS 55  BILITOT 0.7  BILIDIR 0.2  IBILI 0.5   Sedimentation Rate Recent Labs    07/24/20 0451  ESRSEDRATE 62*   C-Reactive Protein Recent Labs    07/24/20 0451  CRP 14.1*    Microbiology:  Studies/Results: MR THORACIC SPINE W WO CONTRAST  Result Date: 07/24/2020 CLINICAL DATA:  60 year old male with fever and back pain. 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 EXAM: MRI THORACIC WITHOUT AND WITH CONTRAST TECHNIQUE: Multiplanar and multiecho pulse sequences of the thoracic spine were obtained without and with intravenous contrast. CONTRAST:  9mL GADAVIST GADOBUTROL 1 MMOL/ML IV SOLN COMPARISON:  CT lumbar spine, CT Abdomen and Pelvis 07/23/2020. Chest CT 10/04/2006. MRI lumbar spine today reported separately. FINDINGS: Limited cervical spine imaging: Evidence of chronic disc and endplate degeneration but otherwise grossly negative. Thoracic spine segmentation: Appears to be normal, hypoplastic ribs suspected at T12. Same numbering system as on the recent CT. Alignment: Relatively preserved thoracic kyphosis. No spondylolisthesis. Vertebrae: No marrow edema or evidence of acute osseous abnormality. Mildly heterogeneous background T1 marrow signal but no suspicious enhancement. No acute osseous abnormality identified. Cord: No spinal cord signal abnormality identified despite some degenerative spinal stenosis detailed below. Conus medullaris appears  normal at T12-L1. No abnormal intradural enhancement.  No dural thickening. Paraspinal and other soft tissues: Negative. Disc levels: T1-T2: Mild facet hypertrophy.  No stenosis. T2-T3: Small right paracentral disc protrusion. No significant stenosis. T3-T4: Small central disc protrusion.  No significant stenosis. T4-T5: Large or central disc protrusion best seen on series 19, image 9. Mild spinal  stenosis and mild cord mass effect. No foraminal involvement. T5-T6: Mild disc bulge and facet hypertrophy without stenosis. T6-T7: Right paracentral small disc protrusion. Mild facet hypertrophy. Borderline to mild spinal stenosis without cord mass effect (series 22 image 21). T7-T8: Moderate right paracentral disc or disc osteophyte complex. Mild facet hypertrophy. Mild spinal stenosis and cord mass effect (series 22, image 23). T8-T9: Similar left paracentral disc or disc osteophyte complex. Mild facet hypertrophy. Mild spinal stenosis and left hemi cord mass effect. Mild left T8 foraminal stenosis. T9-T10: Mild disc bulging and facet hypertrophy greater on the left. No spinal stenosis. Moderate left T9 foraminal stenosis. T10-T11: Mild disc bulging eccentric to the right. Mild facet hypertrophy. Mild right T10 foraminal stenosis. T11-T12: Abnormal disc space and superior T12 endplate which are compatible with healed discitis osteomyelitis. There is a degree of interbody ankylosis noted far laterally on the right. No stenosis. T12-L1: Mild disc bulge.  No stenosis. IMPRESSION: 1. No acute or inflammatory process identified in the thoracic spine. Sequelae of healed T11-T12 discitis osteomyelitis with no adverse features. 2. Advanced thoracic disc disease. Multilevel mild thoracic spinal stenosis and spinal cord mass effect (T4-T5, T7-T8, T8-T9) but no spinal cord signal abnormality. Moderate degenerative neural foraminal stenosis at the left T9 nerve level. Electronically Signed   By: Odessa Fleming M.D.   On: 07/24/2020 07:48   MR Lumbar Spine W Wo Contrast  Result Date: 07/24/2020 CLINICAL DATA:  60 year old male with fever and back pain. 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 EXAM: MRI LUMBAR SPINE WITHOUT AND WITH CONTRAST TECHNIQUE: Multiplanar and multiecho pulse sequences of the lumbar spine were obtained without and with intravenous contrast.  CONTRAST:  32mL GADAVIST GADOBUTROL 1 MMOL/ML IV SOLN in conjunction with contrast enhanced imaging of the thoracic spine reported separately. COMPARISON:  CT lumbar spine 07/23/2020. Thoracic spine today reported separately. FINDINGS: Segmentation: Normal, the same numbering system used on the recent CT. Alignment: Stable from the recent CT. Lumbar lordosis with grade 1 anterolisthesis of L4 on L5. Vertebrae: Chronically obliterated disc at L4-L5 with evidence of posterior element ankylosis, compatible with sequelae of healed discitis osteomyelitis. No marrow edema or evidence of acute osseous abnormality. No abnormal vertebral enhancement. Background bone marrow signal within normal limits. Intact visible sacrum and SI joints. Conus medullaris and cauda equina: Conus extends to the T12-L1 level. No lower spinal cord or conus signal abnormality. No abnormal intradural enhancement or dural thickening. Paraspinal and other soft tissues: Negative. Disc levels: T12-L1:  Reported with the thoracic spine today. L1-L2:  Minor disc bulging and facet hypertrophy.  No stenosis. L2-L3: Disc desiccation and disc space loss. Circumferential disc bulge and up to moderate posterior element hypertrophy. Moderate spinal and severe right lateral recess stenosis (right L3 nerve level). Mild L3 foraminal stenosis. L3-L4: Disc desiccation. Bulky circumferential disc bulge and up to moderate posterior element hypertrophy. Epidural lipomatosis. Moderate to severe spinal stenosis. Mild left but moderate to severe right L3 foraminal stenosis. L4-L5: Chronic anterolisthesis and ankylosis. Osseous mild spinal stenosis, moderate right L4 foraminal stenosis. L5-S1: Far lateral disc bulging and endplate spurring. Additional bulky right  foraminal to extraforaminal component of disc (series 28, image 32). Left foraminal annular fissure of the disc. Moderate facet hypertrophy. No spinal or lateral recess stenosis. Moderate left and moderate to  severe right L5 foraminal stenosis. IMPRESSION: 1. No acute or inflammatory process in the lumbar spine. Suspect healed discitis osteomyelitis at L5-S1 where there is spondylolisthesis and ankylosis. 2. Advanced disc and posterior element degeneration at L2-L3 and L3-L4 with moderate to severe spinal with severe stenosis at the descending and exiting right L3 nerve level. 3. Bulky right foraminal disc at L5-S1. Up to severe right and moderate left L5 foraminal stenosis. Electronically Signed   By: Odessa Fleming M.D.   On: 07/24/2020 08:15   DG Chest Port 1 View  Result Date: 07/23/2020 CLINICAL DATA:  Fever EXAM: PORTABLE CHEST 1 VIEW COMPARISON:  10/04/2006 FINDINGS: Median sternotomy with cardiac valve replacement. Heart size is mildly enlarged. Mild pulmonary vascular congestion. No focal airspace consolidation, pleural effusion, or pneumothorax. IMPRESSION: Cardiomegaly and mild pulmonary vascular congestion. No overt edema or focal airspace consolidation. Electronically Signed   By: Duanne Guess D.O.   On: 07/23/2020 19:36   ECHOCARDIOGRAM COMPLETE  Result Date: 07/25/2020    ECHOCARDIOGRAM REPORT   Patient Name:   Joseph Raymond Date of Exam: 07/24/2020 Medical Rec #:  [id number]           Height:       64.0 in Accession #:    [id number]          Weight:       209.9 lb Date of Birth:  Sep 22, 1960           BSA:          1.997 m Patient Age:    59 years            BP:           111/68 mmHg Patient Gender: M                   HR:           61 bpm. Exam Location:  [facility] Procedure: 2D Echo, Cardiac Doppler and Color Doppler Indications:     Endocarditis I38  History:         Patient has prior history of Echocardiogram examinations. Risk                  Factors:Hypertension and Diabetes.  Sonographer:     Neysa Bonito Roar Referring Phys:  [id number] Marrion Coy Diagnosing Phys: Yvonne Kendall MD IMPRESSIONS  1. Left ventricular ejection fraction, by estimation, is 60 to 65%. The left ventricle has normal  function. The left ventricle has no regional wall motion abnormalities. There is moderate left ventricular hypertrophy. Left ventricular diastolic parameters are indeterminate.  2. Right ventricular systolic function is moderately reduced. The right ventricular size is normal.  3. Left atrial size was mild to moderately dilated.  4. The mitral valve has been repaired/replaced. Mild mitral valve regurgitation. Moderate mitral stenosis.  5. Moderate tricuspid stenosis.  6. The aortic valve has been repaired/replaced. There is moderate thickening of the aortic valve. Aortic valve regurgitation is mild. Moderate aortic valve stenosis. Aortic valve mean gradient measures 24.0 mmHg. Aortic valve Vmax measures 3.50 m/s. Conclusion(s)/Recommendation(s): No definite vegetation is seen, though there appears to be thickening and stenosis/regurgitation involving previously repaired/replaced valves. TEE recommended for further evaluation. FINDINGS  Left Ventricle: Left ventricular ejection fraction, by estimation, is 60 to 65%. The left ventricle has  normal function. The left ventricle has no regional wall motion abnormalities. The left ventricular internal cavity size was normal in size. There is  moderate left ventricular hypertrophy. Left ventricular diastolic parameters are indeterminate. Right Ventricle: The right ventricular size is normal. No increase in right ventricular wall thickness. Right ventricular systolic function is moderately reduced. Left Atrium: Left atrial size was mild to moderately dilated. Right Atrium: Right atrial size was normal in size. Pericardium: There is no evidence of pericardial effusion. Mitral Valve: The mitral valve has been repaired/replaced. Mild mitral valve regurgitation. There is a bioprosthetic valve present in the mitral position. Moderate mitral valve stenosis. MV peak gradient, 18.4 mmHg. The mean mitral valve gradient is 8.0 mmHg. Tricuspid Valve: The tricuspid valve is not well  visualized. Tricuspid valve regurgitation is mild . Moderate tricuspid stenosis. Aortic Valve: The aortic valve has been repaired/replaced. There is moderate thickening of the aortic valve. Aortic valve regurgitation is mild. Moderate aortic stenosis is present. Aortic valve mean gradient measures 24.0 mmHg. Aortic valve peak gradient measures 48.9 mmHg. Aortic valve area, by VTI measures 1.82 cm. There is a bioprosthetic valve present in the aortic position. Pulmonic Valve: The pulmonic valve was not well visualized. Pulmonic valve regurgitation is trivial. No evidence of pulmonic stenosis. Aorta: The aortic root is normal in size and structure. Pulmonary Artery: The pulmonary artery is not well seen. Venous: The inferior vena cava was not well visualized. IAS/Shunts: The interatrial septum was not well visualized.  LEFT VENTRICLE PLAX 2D LVIDd:         4.80 cm  Diastology LVIDs:         3.30 cm  LV e' medial:    5.98 cm/s LV PW:         1.32 cm  LV E/e' medial:  38.5 LV IVS:        1.53 cm  LV e' lateral:   6.09 cm/s LVOT diam:     2.10 cm  LV E/e' lateral: 37.8 LV SV:         120 LV SV Index:   60 LVOT Area:     3.46 cm  RIGHT VENTRICLE RV Mid diam:    3.75 cm RV S prime:     8.49 cm/s TAPSE (M-mode): 1.3 cm LEFT ATRIUM             Index       RIGHT ATRIUM           Index LA diam:        4.20 cm 2.10 cm/m  RA Area:     17.00 cm LA Vol (A2C):   87.5 ml 43.82 ml/m RA Volume:   47.60 ml  23.84 ml/m LA Vol (A4C):   82.9 ml 41.51 ml/m LA Biplane Vol: 86.0 ml 43.07 ml/m  AORTIC VALVE                    PULMONIC VALVE AV Area (Vmax):    1.71 cm     PV Vmax:        1.17 m/s AV Area (Vmean):   1.83 cm     PV Peak grad:   5.5 mmHg AV Area (VTI):     1.82 cm     RVOT Peak grad: 5 mmHg AV Vmax:           349.50 cm/s AV Vmean:          223.000 cm/s AV VTI:  0.662 m AV Peak Grad:      48.9 mmHg AV Mean Grad:      24.0 mmHg LVOT Vmax:         173.00 cm/s LVOT Vmean:        118.000 cm/s LVOT VTI:           0.347 m LVOT/AV VTI ratio: 0.52  AORTA Ao Root diam: 3.20 cm MITRAL VALVE                TRICUSPID VALVE MV Area (PHT): 1.51 cm     TV Peak grad:   13.7 mmHg MV Area VTI:   1.23 cm     TV Mean grad:   7.0 mmHg MV Peak grad:  18.4 mmHg    TV Vmax:        1.85 m/s MV Mean grad:  8.0 mmHg     TV Vmean:       131.0 cm/s MV Vmax:       2.14 m/s     TV VTI:         0.84 msec MV Vmean:      136.0 cm/s MV Decel Time: 502 msec     SHUNTS MV E velocity: 230.00 cm/s  Systemic VTI:  0.35 m MV A velocity: 193.00 cm/s  Systemic Diam: 2.10 cm MV E/A ratio:  1.19 MV A Prime:    10.0 cm/s Yvonne Kendall MD Electronically signed by Yvonne Kendall MD Signature Date/Time: 07/25/2020/4:49:28 PM    Final      Assessment/Plan:  ?Fever ? UTI.  CT scan showed perinephric interstitial changes around both kidneys.  Hence pyelonephritis was questioned.?other source Urine cultures no growth Also has an enlarged prostate gland.  So we will get a post void bladder scan to look at residue. Informed his nurse No pneumonia Low back pain.  MRI does not reveal any discitis or vertebral osteomyelitis. Currently on ceftriaxone Blood culture Neg so far- 2 d echo no obvious vegetation will de-escalate to cefadroxil or bactrim for 4 more days . After that he will go back on amoxicillin    BID suppressive therapy    Low back pain with radiation down the legs.resolved-  MRI of thoracic and lumbar vertebrae shows no acute or inflammatory process in the lumbar spine or thoracic spine.  Suspect healed discitis at L5-S1. Advanced disc and posterior element degeneration at L2-L3 and L3-L4.  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 ? CT abdomen shows cirrhotic changes involving the liver with portal venous hypertension, portal venous collaterals and borderline splenomegaly.  Discussed the management with him

## 2020-07-25 NOTE — Progress Notes (Signed)
BP low. Dr Chipper Herb said to hold am cozaar

## 2020-07-25 NOTE — Progress Notes (Signed)
PROGRESS NOTE    Joseph Raymond  WCB:[id number] DOB: [birth year] DOA: 07/23/2020 PCP: [name]   Chief complaint.  Fever and back pain. 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  Hepresents to the emergency department for chief concerns of worsening bilateral low back pain with a high fever for duration of 4 days. He had MRI of lumbar and thoracic spine performed on 3/13, showed no evidence of discitis or osteomyelitis.   Assessment & Plan:   Active Problems:   H/O mechanical aortic valve replacement   HTN (hypertension)   HLD (hyperlipidemia)   Type II diabetes mellitus with renal manifestations (HCC)   Pyelonephritis  #1.  Sepsis. Ruled in. Pyelonephritis. Ruled in Biosynthetic mitral and aortic valve. Patient had a history of endocarditis.  Blood cultures so far still negative. He has some heart murmur, trans thoracic echocardiogram is pending.  Condition most likely caused by pyelonephritis. Patient has been seen by ID, antibiotic switched to Rocephin. His condition appears to be improving, he probably can be discharged home tomorrow if echocardiogram does not show any evidence of endocarditis.  2.  Chronic thrombocytopenia. Stable.  3.  Essential hypertension. Continue home medicine.  4.  Type 2 diabetes uncontrolled with hyperglycemia Hemoglobin A1c 7.1, continue current  regimen.   DVT prophylaxis: CSDs Code Status: Full Family Communication:  Disposition Plan:  .   Status is: Inpatient  Remains inpatient appropriate because:Inpatient level of care appropriate due to severity of illness   Dispo: The patient is from: Home              Anticipated d/c is to: Home              Patient currently is not medically stable to d/c.   Difficult to place patient No        I/O last 3 completed shifts: In: 1241.3 [P.O.:840; IV Piggyback:401.3] Out: 2000 [Urine:2000] Total I/O In: 240 [P.O.:240] Out: 625 [Urine:625]     Consultants:   ID  Procedures: None  Antimicrobials: Rocephin  Subjective: Patient doing well today, had a low-grade fever last night.  Neck pain is improving. Patient has left arm itching, which is started after Rocephin.  But itching only localized in the small area, no skin rash. Denies any short of breath or cough. No abdominal pain or nausea vomiting. No dysuria or hematuria.  Objective: Vitals:   07/25/20 0147 07/25/20 0300 07/25/20 0855 07/25/20 1110  BP: 99/76 98/60 (!) 101/58 110/66  Pulse: 68  (!) 55 (!) 55  Resp: 20 20 20 17   Temp: 100.2 F (37.9 C) 99.8 F (37.7 C) 98.6 F (37 C) 98.3 F (36.8 C)  TempSrc: Oral Oral Oral Oral  SpO2: 100%  98% 96%  Weight:      Height:        Intake/Output Summary (Last 24 hours) at 07/25/2020 1209 Last data filed at 07/25/2020 1026 Gross per 24 hour  Intake 681.32 ml  Output 1625 ml  Net -943.68 ml   Filed Weights   07/23/20 1314 07/23/20 2117  Weight: 103.4 kg 95.2  kg    Examination:  General exam: Appears calm and comfortable  Respiratory system: Clear to auscultation. Respiratory effort normal. Cardiovascular system: S1 & S2 heard, RRR. 2/6 SM at apex and RUSB.  No pedal edema. Gastrointestinal system: Abdomen is nondistended, soft and nontender. No organomegaly or masses felt. Normal bowel sounds heard. Central nervous system: Alert and oriented. No  focal neurological deficits. Extremities: Symmetric 5 x 5 power. Skin: No rashes, lesions or ulcers Psychiatry: Judgement and insight appear normal. Mood & affect appropriate.     Data Reviewed: I have personally reviewed following labs and imaging studies  CBC: Recent Labs  Lab 07/23/20 1322  WBC 8.9  NEUTROABS 7.4  HGB 12.7*  HCT 38.6*  MCV 89.1  PLT 91*   Basic Metabolic Panel: Recent Labs  Lab 07/23/20 1322  NA 130*  K 3.9  CL 99  CO2 22  GLUCOSE 238*  BUN 15  CREATININE 0.87  CALCIUM 8.5*   GFR: Estimated Creatinine Clearance: 92.8 mL/min (by C-G formula based on SCr of 0.87 mg/dL). Liver Function Tests: Recent Labs  Lab 07/25/20 0428  AST 93*  ALT 93*  ALKPHOS 55  BILITOT 0.7  PROT 6.3*  ALBUMIN 3.1*   No results for input(s): LIPASE, AMYLASE in the last 168 hours. No results for input(s): AMMONIA in the last 168 hours. Coagulation Profile: Recent Labs  Lab 07/24/20 0451  INR 1.1   Cardiac Enzymes: Recent Labs  Lab 07/23/20 1927 07/24/20 0451  CKTOTAL 448* 456*   BNP (last 3 results) No results for input(s): PROBNP in the last 8760 hours. HbA1C: Recent Labs    07/24/20 1045  HGBA1C 7.1*   CBG: Recent Labs  Lab 07/24/20 1647 07/24/20 2225 07/25/20 0357 07/25/20 0752 07/25/20 1141  GLUCAP 187* 172* 156* 116* 182*   Lipid Profile: No results for input(s): CHOL, HDL, LDLCALC, TRIG, CHOLHDL, LDLDIRECT in the last 72 hours. Thyroid Function Tests: Recent Labs    07/23/20 1838  TSH 0.278*   Anemia Panel: No results for input(s): VITAMINB12, FOLATE, FERRITIN, TIBC, IRON, RETICCTPCT in the last 72 hours. Sepsis Labs: Recent Labs  Lab 07/23/20 1542 07/23/20 1855  PROCALCITON  --  0.63  LATICACIDVEN 1.3  --     Recent Results (from the past 240 hour(s))  Urine culture     Status: Abnormal   Collection Time: 07/23/20  3:20 PM   Specimen: Urine, Random  Result Value Ref Range Status   Specimen Description   Final     URINE, RANDOM Performed at [facility], [address] [place], [place] [address]    Special Requests   Final    NONE Performed at [facility], [address] [place], [place] [address]    Culture (A)  Final    <10,000 COLONIES/mL INSIGNIFICANT GROWTH Performed at [facility], [address] [place], [place] [address]    Report Status 07/24/2020 FINAL  Final  CULTURE, BLOOD (ROUTINE X 2) w Reflex to ID Panel     Status: None (Preliminary result)   Collection Time: 07/23/20  5:51 PM   Specimen: BLOOD  Result Value Ref Range Status   Specimen Description BLOOD LEFT ANTECUBITAL  Final   Special Requests   Final    BOTTLES DRAWN AEROBIC AND ANAEROBIC Blood Culture adequate volume   Culture   Final    NO GROWTH 2 DAYS Performed at [facility], [address] [place], [place] [address]    Report Status  PENDING  Incomplete  CULTURE, BLOOD (ROUTINE X 2) w Reflex to ID Panel     Status: None (Preliminary result)   Collection Time: 07/23/20  5:51 PM   Specimen: BLOOD  Result Value Ref Range Status   Specimen Description BLOOD RIGHT ANTECUBITAL  Final   Special Requests   Final    BOTTLES DRAWN AEROBIC AND ANAEROBIC Blood Culture adequate volume   Culture   Final    NO GROWTH 2 DAYS Performed at [facility], [address], [place], [place] [address]    Report Status PENDING  Incomplete  SARS CORONAVIRUS 2 (TAT 6-24 HRS) Nasopharyngeal Nasopharyngeal Swab     Status: None   Collection Time: 07/23/20  8:38 PM   Specimen: Nasopharyngeal Swab  Result Value Ref Range Status   SARS Coronavirus 2 NEGATIVE NEGATIVE Final    Comment: (NOTE) SARS-CoV-2 target nucleic acids are NOT DETECTED.  The SARS-CoV-2 RNA is generally detectable in upper and lower respiratory specimens during the acute phase of infection. Negative results do not preclude SARS-CoV-2 infection, do not rule out co-infections with other pathogens, and should not be  used as the sole basis for treatment or other patient management decisions. Negative results must be combined with clinical observations, patient history, and epidemiological information. The expected result is Negative.  Fact Sheet for Patients: HairSlick.no  Fact Sheet for Healthcare Providers: quierodirigir.com  This test is not yet approved or cleared by the [place] FDA and  has been authorized for detection and/or diagnosis of SARS-CoV-2 by FDA under an Emergency Use Authorization (EUA). This EUA will remain  in effect (meaning this test can be used) for the duration of the COVID-19 declaration under Se ction 564(b)(1) of the Act, 21 U.S.C. section 360bbb-3(b)(1), unless the authorization is terminated or revoked sooner.  Performed at [facility], [address] [place], [place] [address]          Radiology Studies: MR THORACIC SPINE W WO CONTRAST  Result Date: 07/24/2020 CLINICAL DATA:  59 year old male with fever and back pain. 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 EXAM: MRI THORACIC WITHOUT AND WITH CONTRAST TECHNIQUE: Multiplanar and multiecho pulse sequences of the thoracic spine were obtained without and with intravenous contrast. CONTRAST:  9mL GADAVIST GADOBUTROL 1 MMOL/ML IV SOLN COMPARISON:  CT lumbar spine, CT Abdomen and Pelvis 07/23/2020. Chest CT 10/04/2006. MRI lumbar spine today reported separately. FINDINGS: Limited cervical spine imaging: Evidence of chronic disc and endplate degeneration but otherwise grossly negative. Thoracic spine segmentation: Appears to be normal, hypoplastic ribs suspected at T12. Same numbering system as on the recent CT. Alignment: Relatively preserved thoracic kyphosis. No spondylolisthesis. Vertebrae: No marrow edema or evidence of acute osseous abnormality. Mildly heterogeneous background T1 marrow signal  but no suspicious enhancement. No acute osseous abnormality identified. Cord: No spinal cord signal abnormality identified despite some degenerative spinal stenosis detailed below. Conus medullaris appears normal at T12-L1. No abnormal intradural enhancement.  No dural thickening. Paraspinal and other soft tissues: Negative. Disc levels: T1-T2: Mild facet hypertrophy.  No stenosis. T2-T3: Small right paracentral disc protrusion. No significant stenosis. T3-T4: Small central disc protrusion.  No significant stenosis. T4-T5: Large or central disc protrusion best seen on series 19, image 9. Mild spinal stenosis and mild cord mass effect. No foraminal involvement. T5-T6: Mild disc bulge and facet hypertrophy without stenosis. T6-T7: Right paracentral small disc protrusion. Mild facet hypertrophy. Borderline to mild spinal stenosis without cord mass  effect (series 22 image 21). T7-T8: Moderate right paracentral disc or disc osteophyte complex. Mild facet hypertrophy. Mild spinal stenosis and cord mass effect (series 22, image 23). T8-T9: Similar left paracentral disc or disc osteophyte complex. Mild facet hypertrophy. Mild spinal stenosis and left hemi cord mass effect. Mild left T8 foraminal stenosis. T9-T10: Mild disc bulging and facet hypertrophy greater on the left. No spinal stenosis. Moderate left T9 foraminal stenosis. T10-T11: Mild disc bulging eccentric to the right. Mild facet hypertrophy. Mild right T10 foraminal stenosis. T11-T12: Abnormal disc space and superior T12 endplate which are compatible with healed discitis osteomyelitis. There is a degree of interbody ankylosis noted far laterally on the right. No stenosis. T12-L1: Mild disc bulge.  No stenosis. IMPRESSION: 1. No acute or inflammatory process identified in the thoracic spine. Sequelae of healed T11-T12 discitis osteomyelitis with no adverse features. 2. Advanced thoracic disc disease. Multilevel mild thoracic spinal stenosis and spinal cord mass  effect (T4-T5, T7-T8, T8-T9) but no spinal cord signal abnormality. Moderate degenerative neural foraminal stenosis at the left T9 nerve level. Electronically Signed   By: Odessa Fleming M.D.   On: 07/24/2020 07:48   MR Lumbar Spine W Wo Contrast  Result Date: 07/24/2020 CLINICAL DATA:  60 year old male with fever and back pain. 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 EXAM: MRI LUMBAR SPINE WITHOUT AND WITH CONTRAST TECHNIQUE: Multiplanar and multiecho pulse sequences of the lumbar spine were obtained without and with intravenous contrast. CONTRAST:  9mL GADAVIST GADOBUTROL 1 MMOL/ML IV SOLN in conjunction with contrast enhanced imaging of the thoracic spine reported separately. COMPARISON:  CT lumbar spine 07/23/2020. Thoracic spine today reported separately. FINDINGS: Segmentation: Normal, the same numbering system used on the recent CT. Alignment: Stable from the recent CT. Lumbar lordosis with grade 1 anterolisthesis of L4 on L5. Vertebrae: Chronically obliterated disc at L4-L5 with evidence of posterior element ankylosis, compatible with sequelae of healed discitis osteomyelitis. No marrow edema or evidence of acute osseous abnormality. No abnormal vertebral enhancement. Background bone marrow signal within normal limits. Intact visible sacrum and SI joints. Conus medullaris and cauda equina: Conus extends to the T12-L1 level. No lower spinal cord or conus signal abnormality. No abnormal intradural enhancement or dural thickening. Paraspinal and other soft tissues: Negative. Disc levels: T12-L1:  Reported with the thoracic spine today. L1-L2:  Minor disc bulging and facet hypertrophy.  No stenosis. L2-L3: Disc desiccation and disc space loss. Circumferential disc bulge and up to moderate posterior element hypertrophy. Moderate spinal and severe right lateral recess stenosis (right L3 nerve level). Mild L3 foraminal stenosis. L3-L4: Disc desiccation. Bulky  circumferential disc bulge and up to moderate posterior element hypertrophy. Epidural lipomatosis. Moderate to severe spinal stenosis. Mild left but moderate to severe right L3 foraminal stenosis. L4-L5: Chronic anterolisthesis and ankylosis. Osseous mild spinal stenosis, moderate right L4 foraminal stenosis. L5-S1: Far lateral disc bulging and endplate spurring. Additional bulky right foraminal to extraforaminal component of disc (series 28, image 32). Left foraminal annular fissure of the disc. Moderate facet hypertrophy. No spinal or lateral recess stenosis. Moderate left and moderate to severe right L5 foraminal stenosis. IMPRESSION: 1. No acute or inflammatory process in the lumbar spine. Suspect healed discitis osteomyelitis at L5-S1 where there is spondylolisthesis and ankylosis. 2. Advanced disc and posterior element degeneration at L2-L3 and L3-L4 with moderate to severe spinal with severe stenosis at the descending and exiting right L3 nerve level. 3. Bulky right foraminal disc at L5-S1.  Up to severe right and moderate left L5 foraminal stenosis. Electronically Signed   By: Odessa Fleming M.D.   On: 07/24/2020 08:15   CT L-SPINE NO CHARGE  Result Date: 07/23/2020 CLINICAL DATA:  Fever and painful urination for 2 days. Back pain. History of prior lumbar fusion. EXAM: CT LUMBAR SPINE WITHOUT CONTRAST TECHNIQUE: Multidetector CT imaging of the lumbar spine was performed without intravenous contrast administration. Multiplanar CT image reconstructions were also generated. COMPARISON:  None. FINDINGS: Segmentation: There are five lumbar type vertebral bodies. The last full intervertebral disc space is labeled L5-S1. Alignment: Normal overall alignment of the lumbar vertebral bodies. There is slight anterolisthesis of L4 compared L5 but there is solid interbody fusion at this level in the facets are also fused at this level. Vertebrae: Superior endplate depression T12 is noted. This is of uncertain age. The lumbar  vertebral bodies are intact. No acute fracture. Paraspinal and other soft tissues: No significant findings. Disc levels: T12-L1: No disc protrusions, spinal or foraminal stenosis. L1-2: Mild annular bulge with slight flattening of the ventral thecal sac but no significant spinal or foraminal stenosis. Mild/moderate facet disease. L2-3: Degenerative disc disease and diffuse bulging degenerated annulus in combination with short pedicles and advanced facet disease contributing to moderately severe to severe spinal and bilateral lateral recess stenosis. No significant foraminal stenosis. L3-4: Bulging degenerated annulus, short pedicles, advanced facet disease and ligamentum flavum thickening contributing to moderately severe to severe spinal and bilateral lateral recess stenosis. Mild foraminal stenosis also. L4-5: Interbody fusion changes are noted. Mild anterolisthesis of L4 with osteophytic spurring and advanced facet disease contributing to mild spinal and bilateral lateral recess stenosis. No significant foraminal stenosis. L5-S1: Advanced facet disease but no spinal or lateral recess stenosis. Mild left and moderate right foraminal stenosis mainly due to spurring changes. IMPRESSION: 1. Degenerative lumbar spondylosis with multilevel disc disease and advanced facet disease. 2. Moderately severe to severe spinal and bilateral lateral recess stenosis at L2-3 and L3-4. 3. Mild spinal and bilateral lateral recess stenosis at L4-5. 4. Interbody fusion changes at L4-5. 5. Mild left and moderate right foraminal stenosis at L5-S1 mainly due to spurring changes. 6. Age indeterminate compression deformity of T12. Aortic Atherosclerosis (ICD10-I70.0). Electronically Signed   By: Rudie Meyer M.D.   On: 07/23/2020 16:28   DG Chest Port 1 View  Result Date: 07/23/2020 CLINICAL DATA:  Fever EXAM: PORTABLE CHEST 1 VIEW COMPARISON:  10/04/2006 FINDINGS: Median sternotomy with cardiac valve replacement. Heart size is mildly  enlarged. Mild pulmonary vascular congestion. No focal airspace consolidation, pleural effusion, or pneumothorax. IMPRESSION: Cardiomegaly and mild pulmonary vascular congestion. No overt edema or focal airspace consolidation. Electronically Signed   By: Duanne Guess D.O.   On: 07/23/2020 19:36   CT Renal Stone Study  Result Date: 07/23/2020 CLINICAL DATA:  Bilateral flank pain and hematuria. EXAM: CT ABDOMEN AND PELVIS WITHOUT CONTRAST TECHNIQUE: Multidetector CT imaging of the abdomen and pelvis was performed following the standard protocol without IV contrast. COMPARISON:  None FINDINGS: Lower chest: The lung bases are clear. No pleural effusions or pulmonary lesions. The heart is normal in size. Prosthetic mitral and pulmonic valves are noted. No pericardial effusion. Hepatobiliary: Suspect cirrhotic changes involving the liver with an irregular liver contour, dilated hepatic fissures and right lobe atrophy. Suspect portal venous hypertension and portal venous collaterals. No obvious hepatic lesions. The gallbladder is unremarkable. No common bile duct dilatation. Pancreas: No mass, inflammation or ductal dilatation. Spleen: Borderline splenomegaly.  No splenic  lesions. Adrenals/Urinary Tract: The adrenal glands are unremarkable. Perinephric interstitial changes around both kidneys could be acute or chronic. No renal calculi or hydroureteronephrosis. No obstructing ureteral calculi. No bladder calculi. No worrisome renal or bladder lesions are identified without contrast. Stomach/Bowel: The stomach, duodenum, small bowel and colon are grossly normal. No acute inflammatory changes, mass lesions or obstructive findings. The terminal ileum and appendix are normal. Vascular/Lymphatic: Scattered atherosclerotic calcifications but no aortic aneurysm. No mesenteric or retroperitoneal mass or adenopathy. Small scattered lymph nodes are noted. Reproductive: The prostate gland is enlarged. The seminal vesicles are  unremarkable. Other: No free pelvic fluid collections. Small scattered pelvic lymph nodes are noted. Musculoskeletal: No significant bony findings. No bone lesions or acute fractures. The T12 fracture appears remote. Interbody fusion changes noted at L4-5. IMPRESSION: 1. No renal, ureteral or bladder calculi or mass. 2. Perinephric interstitial changes around both kidneys could be acute or chronic. Could not exclude the possibility of pyelonephritis. 3. Suspect cirrhotic changes involving the liver with portal venous hypertension, portal venous collaterals and borderline splenomegaly. No obvious hepatic lesions. 4. Enlarged prostate gland. 5. Small scattered retroperitoneal and pelvic lymph nodes which are indeterminate. A follow-up CT examination with contrast may be helpful for further evaluation. Electronically Signed   By: Rudie Meyer M.D.   On: 07/23/2020 16:40        Scheduled Meds: . insulin aspart  0-9 Units Subcutaneous TID WC  . losartan  50 mg Oral Daily  . pantoprazole  40 mg Oral Daily   Continuous Infusions: . cefTRIAXone (ROCEPHIN)  IV Stopped (07/25/20 0550)     LOS: 1 day    Time spent: 28 minutes    Marrion Coy, MD [facility]   To contact the attending provider between 7A-7P or the covering provider during after hours 7P-7A, please log into the web site [email or web address] and access using universal [place] password for that web site. If you do not have the password, please call the hospital operator.  07/25/2020, 12:09 PM

## 2020-07-25 NOTE — Progress Notes (Signed)
Per MD, bladder scan pt post void. Pt only measured to have 71mL in bladder.

## 2020-07-25 NOTE — Progress Notes (Signed)
Patient complaining of itching in Left arm, after receiving Rocephin IVPB; also complaining of pain in arm. No redness or swelling noted at this time; IVF not running at this time; Manuela Schwartz, NP notified via secure chat regarding above S/S. Awaiting acknowledgement. Windy Carina, RN 6:02 AM 07/25/2020

## 2020-07-26 LAB — CBC WITH DIFFERENTIAL/PLATELET
Abs Immature Granulocytes: 0.03 10*3/uL (ref 0.00–0.07)
Basophils Absolute: 0 10*3/uL (ref 0.0–0.1)
Basophils Relative: 0 %
Eosinophils Absolute: 0.2 10*3/uL (ref 0.0–0.5)
Eosinophils Relative: 3 %
HCT: 36.5 % — ABNORMAL LOW (ref 39.0–52.0)
Hemoglobin: 11.9 g/dL — ABNORMAL LOW (ref 13.0–17.0)
Immature Granulocytes: 1 %
Lymphocytes Relative: 22 %
Lymphs Abs: 1.2 10*3/uL (ref 0.7–4.0)
MCH: 29.2 pg (ref 26.0–34.0)
MCHC: 32.6 g/dL (ref 30.0–36.0)
MCV: 89.7 fL (ref 80.0–100.0)
Monocytes Absolute: 0.6 10*3/uL (ref 0.1–1.0)
Monocytes Relative: 12 %
Neutro Abs: 3.3 10*3/uL (ref 1.7–7.7)
Neutrophils Relative %: 62 %
Platelets: 80 10*3/uL — ABNORMAL LOW (ref 150–400)
RBC: 4.07 MIL/uL — ABNORMAL LOW (ref 4.22–5.81)
RDW: 14.6 % (ref 11.5–15.5)
WBC: 5.2 10*3/uL (ref 4.0–10.5)
nRBC: 0 % (ref 0.0–0.2)

## 2020-07-26 LAB — COMPREHENSIVE METABOLIC PANEL
ALT: 103 U/L — ABNORMAL HIGH (ref 0–44)
AST: 75 U/L — ABNORMAL HIGH (ref 15–41)
Albumin: 3.2 g/dL — ABNORMAL LOW (ref 3.5–5.0)
Alkaline Phosphatase: 60 U/L (ref 38–126)
Anion gap: 9 (ref 5–15)
BUN: 10 mg/dL (ref 8–23)
CO2: 26 mmol/L (ref 22–32)
Calcium: 8.5 mg/dL — ABNORMAL LOW (ref 8.9–10.3)
Chloride: 101 mmol/L (ref 98–111)
Creatinine, Ser: 0.59 mg/dL — ABNORMAL LOW (ref 0.61–1.24)
GFR, Estimated: >60 mL/min (ref >60)
Glucose, Bld: 109 mg/dL — ABNORMAL HIGH (ref 70–99)
Potassium: 3.8 mmol/L (ref 3.5–5.1)
Sodium: 136 mmol/L (ref 135–145)
Total Bilirubin: 0.7 mg/dL (ref 0.3–1.2)
Total Protein: 6.7 g/dL (ref 6.5–8.1)

## 2020-07-26 LAB — GLUCOSE, CAPILLARY
Glucose-Capillary: 122 mg/dL — ABNORMAL HIGH (ref 70–99)
Glucose-Capillary: 201 mg/dL — ABNORMAL HIGH (ref 70–99)

## 2020-07-26 LAB — MAGNESIUM: Magnesium: 2.1 mg/dL (ref 1.7–2.4)

## 2020-07-26 MED ORDER — SULFAMETHOXAZOLE-TRIMETHOPRIM 800-160 MG PO TABS
1.0000 | ORAL_TABLET | Freq: Two times a day (BID) | ORAL | 0 refills | Status: AC
Start: 2020-07-26 — End: 2020-07-31

## 2020-07-26 NOTE — Progress Notes (Signed)
Date of Admission:  07/23/2020   Total days of antibiotics 4  ID: Joseph Raymond is a 60 y.o. male  Active Problems:   H/O mechanical aortic valve replacement   HTN (hypertension)   HLD (hyperlipidemia)   Type II diabetes mellitus with renal manifestations (HCC)   Pyelonephritis   Hyponatremia   Sepsis (HCC)    Subjective: Doing well No fever No pain back  Medications:  . insulin aspart  0-9 Units Subcutaneous TID WC  . losartan  50 mg Oral Daily  . pantoprazole  40 mg Oral Daily  . sulfamethoxazole-trimethoprim  1 tablet Oral Q12H    Objective: Vital signs in last 24 hours: Patient Vitals for the past 24 hrs:  BP Temp Temp src Pulse Resp SpO2  07/26/20 0442 113/72 98.4 F (36.9 C) -- 61 16 97 %  07/25/20 2340 111/70 98.5 F (36.9 C) Oral 60 16 97 %  07/25/20 1925 129/79 98.6 F (37 C) Oral 65 -- 100 %  07/25/20 1535 106/68 98.8 F (37.1 C) Oral 65 15 99 %  07/25/20 1110 110/66 98.3 F (36.8 C) Oral (!) 55 17 96 %  PHYSICAL EXAM:  General: Alert, cooperative, no distress, appears stated age.  Head: Normocephalic, without obvious abnormality, atraumatic. Eyes: Conjunctivae clear, anicteric sclerae. Pupils are equal ENT Nares normal. No drainage or sinus tenderness. Lips, mucosa, and tongue normal. No Thrush Neck: Supple, symmetrical, no adenopathy, thyroid: non tender no carotid bruit and no JVD. Back: No CVA tenderness. Lungs: Clear to auscultation bilaterally. No Wheezing or Rhonchi. No rales. Heart: Regular rate and rhythm, no murmur, rub or gallop. Abdomen: Soft, non-tender,not distended. Bowel sounds normal. No masses Extremities: atraumatic, no cyanosis. No edema. No clubbing Skin: No rashes or lesions. Or bruising Lymph: Cervical, supraclavicular normal. Neurologic: Grossly non-focal  Lab Results Recent Labs    07/23/20 1322 07/26/20 0426  WBC 8.9 5.2  HGB 12.7* 11.9*  HCT 38.6* 36.5*  NA 130* 136  K 3.9 3.8  CL 99 101  CO2 22 26  BUN  15 10  CREATININE 0.87 0.59*   Liver Panel Recent Labs    07/25/20 0428 07/26/20 0426  PROT 6.3* 6.7  ALBUMIN 3.1* 3.2*  AST 93* 75*  ALT 93* 103*  ALKPHOS 55 60  BILITOT 0.7 0.7  BILIDIR 0.2  --   IBILI 0.5  --    Sedimentation Rate Recent Labs    07/24/20 0451  ESRSEDRATE 62*   C-Reactive Protein Recent Labs    07/24/20 0451  CRP 14.1*    Microbiology: BC ng Studies/Results: ECHOCARDIOGRAM COMPLETE  Result Date: 07/25/2020    ECHOCARDIOGRAM REPORT   Patient Name:   Joseph Raymond Date of Exam: 07/24/2020 Medical Rec #:  [id number]           Height:       64.0 in Accession #:    [id number]          Weight:       209.9 lb Date of Birth:  08/15/1960           BSA:          1.997 m Patient Age:    59 years            BP:           111/68 mmHg Patient Gender: M                   HR:  61 bpm. Exam Location:  [facility] Procedure: 2D Echo, Cardiac Doppler and Color Doppler Indications:     Endocarditis I38  History:         Patient has prior history of Echocardiogram examinations. Risk                  Factors:Hypertension and Diabetes.  Sonographer:     Neysa Bonito Roar Referring Phys:  [id number] Marrion Coy Diagnosing Phys: Yvonne Kendall MD IMPRESSIONS  1. Left ventricular ejection fraction, by estimation, is 60 to 65%. The left ventricle has normal function. The left ventricle has no regional wall motion abnormalities. There is moderate left ventricular hypertrophy. Left ventricular diastolic parameters are indeterminate.  2. Right ventricular systolic function is moderately reduced. The right ventricular size is normal.  3. Left atrial size was mild to moderately dilated.  4. The mitral valve has been repaired/replaced. Mild mitral valve regurgitation. Moderate mitral stenosis.  5. Moderate tricuspid stenosis.  6. The aortic valve has been repaired/replaced. There is moderate thickening of the aortic valve. Aortic valve regurgitation is mild. Moderate aortic valve stenosis. Aortic  valve mean gradient measures 24.0 mmHg. Aortic valve Vmax measures 3.50 m/s. Conclusion(s)/Recommendation(s): No definite vegetation is seen, though there appears to be thickening and stenosis/regurgitation involving previously repaired/replaced valves. TEE recommended for further evaluation. FINDINGS  Left Ventricle: Left ventricular ejection fraction, by estimation, is 60 to 65%. The left ventricle has normal function. The left ventricle has no regional wall motion abnormalities. The left ventricular internal cavity size was normal in size. There is  moderate left ventricular hypertrophy. Left ventricular diastolic parameters are indeterminate. Right Ventricle: The right ventricular size is normal. No increase in right ventricular wall thickness. Right ventricular systolic function is moderately reduced. Left Atrium: Left atrial size was mild to moderately dilated. Right Atrium: Right atrial size was normal in size. Pericardium: There is no evidence of pericardial effusion. Mitral Valve: The mitral valve has been repaired/replaced. Mild mitral valve regurgitation. There is a bioprosthetic valve present in the mitral position. Moderate mitral valve stenosis. MV peak gradient, 18.4 mmHg. The mean mitral valve gradient is 8.0 mmHg. Tricuspid Valve: The tricuspid valve is not well visualized. Tricuspid valve regurgitation is mild . Moderate tricuspid stenosis. Aortic Valve: The aortic valve has been repaired/replaced. There is moderate thickening of the aortic valve. Aortic valve regurgitation is mild. Moderate aortic stenosis is present. Aortic valve mean gradient measures 24.0 mmHg. Aortic valve peak gradient measures 48.9 mmHg. Aortic valve area, by VTI measures 1.82 cm. There is a bioprosthetic valve present in the aortic position. Pulmonic Valve: The pulmonic valve was not well visualized. Pulmonic valve regurgitation is trivial. No evidence of pulmonic stenosis. Aorta: The aortic root is normal in size and  structure. Pulmonary Artery: The pulmonary artery is not well seen. Venous: The inferior vena cava was not well visualized. IAS/Shunts: The interatrial septum was not well visualized.  LEFT VENTRICLE PLAX 2D LVIDd:         4.80 cm  Diastology LVIDs:         3.30 cm  LV e' medial:    5.98 cm/s LV PW:         1.32 cm  LV E/e' medial:  38.5 LV IVS:        1.53 cm  LV e' lateral:   6.09 cm/s LVOT diam:     2.10 cm  LV E/e' lateral: 37.8 LV SV:         120 LV SV Index:  60 LVOT Area:     3.46 cm  RIGHT VENTRICLE RV Mid diam:    3.75 cm RV S prime:     8.49 cm/s TAPSE (M-mode): 1.3 cm LEFT ATRIUM             Index       RIGHT ATRIUM           Index LA diam:        4.20 cm 2.10 cm/m  RA Area:     17.00 cm LA Vol (A2C):   87.5 ml 43.82 ml/m RA Volume:   47.60 ml  23.84 ml/m LA Vol (A4C):   82.9 ml 41.51 ml/m LA Biplane Vol: 86.0 ml 43.07 ml/m  AORTIC VALVE                    PULMONIC VALVE AV Area (Vmax):    1.71 cm     PV Vmax:        1.17 m/s AV Area (Vmean):   1.83 cm     PV Peak grad:   5.5 mmHg AV Area (VTI):     1.82 cm     RVOT Peak grad: 5 mmHg AV Vmax:           349.50 cm/s AV Vmean:          223.000 cm/s AV VTI:            0.662 m AV Peak Grad:      48.9 mmHg AV Mean Grad:      24.0 mmHg LVOT Vmax:         173.00 cm/s LVOT Vmean:        118.000 cm/s LVOT VTI:          0.347 m LVOT/AV VTI ratio: 0.52  AORTA Ao Root diam: 3.20 cm MITRAL VALVE                TRICUSPID VALVE MV Area (PHT): 1.51 cm     TV Peak grad:   13.7 mmHg MV Area VTI:   1.23 cm     TV Mean grad:   7.0 mmHg MV Peak grad:  18.4 mmHg    TV Vmax:        1.85 m/s MV Mean grad:  8.0 mmHg     TV Vmean:       131.0 cm/s MV Vmax:       2.14 m/s     TV VTI:         0.84 msec MV Vmean:      136.0 cm/s MV Decel Time: 502 msec     SHUNTS MV E velocity: 230.00 cm/s  Systemic VTI:  0.35 m MV A velocity: 193.00 cm/s  Systemic Diam: 2.10 cm MV E/A ratio:  1.19 MV A Prime:    10.0 cm/s Yvonne Kendall MD Electronically signed by Yvonne Kendall MD  Signature Date/Time: 07/25/2020/4:49:28 PM    Final      Assessment/Plan: Fever.  Likely UTI.  CT scan showed perinephric interstitial changes around both kidneys.  Hence pyelonephritis was questioned.  Urine culture has no growth though.  He has an enlarged prostate gland.  So got a post void bladder scan which is only 24 cc.  No evidence of pneumonia blood cultures negative the low back pain may does do an MRI which did not show any active infection.  So so after IV antibiotics he switched over to Bactrim.  We will continue that for 5 more  days.  He will restart amoxicillin 500 mg twice daily suppressive therapy for his recurrent infections.  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  Underwent mitral valve, aortic valve replacement and tricuspid annuloplasty.  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  [facility].He finished 6 weeks of ceftriaxone and is now on indefinite oral amoxicillin suppressive therapy.  CT abdomen shows cirrhotic changes involving the liver with portal venous hypertension, portal venous collaterals and borderline splenomegaly.  Discussed the management with patient and care team

## 2020-07-26 NOTE — Progress Notes (Signed)
Joseph Raymond to be D/C'd Home per MD order.  Discussed prescriptions and follow up appointments with the patient. Prescriptions given to patient, medication list explained in detail. Pt verbalized understanding.  Allergies as of 07/26/2020   No Known Allergies     Medication List    TAKE these medications   amoxicillin 500 MG capsule Commonly known as: AMOXIL Take 1 capsule (500 mg total) by mouth 2 (two) times daily. Start taking on: July 31, 2020 What changed: These instructions start on July 31, 2020. If you are unsure what to do until then, ask your doctor or other care provider.   atorvastatin 40 MG tablet Commonly known as: LIPITOR Take 40 mg by mouth daily.   furosemide 20 MG tablet Commonly known as: LASIX Take 20 mg by mouth daily.   losartan 50 MG tablet Commonly known as: COZAAR Take 50 mg by mouth daily.   metFORMIN 500 MG 24 hr tablet Commonly known as: GLUCOPHAGE-XR Take 500 mg by mouth daily.   pantoprazole 40 MG tablet Commonly known as: PROTONIX Take 1 tablet (40 mg total) by mouth 2 (two) times daily before a meal. What changed: when to take this   sulfamethoxazole-trimethoprim 800-160 MG tablet Commonly known as: BACTRIM DS Take 1 tablet by mouth every 12 (twelve) hours for 5 days.       Vitals:   07/26/20 0442 07/26/20 1015  BP: 113/72 111/68  Pulse: 61 61  Resp: 16 16  Temp: 98.4 F (36.9 C) 98.5 F (36.9 C)  SpO2: 97% 97%    Skin clean, dry and intact without evidence of skin break down, no evidence of skin tears noted. IV catheter discontinued intact. Site without signs and symptoms of complications. Dressing and pressure applied. Pt denies pain at this time. No complaints noted.  An After Visit Summary was printed and given to the patient. Patient escorted via WC, and D/C home via private auto.  Madie Reno, RN

## 2020-07-26 NOTE — Discharge Summary (Signed)
[facility] Discharge Summary   Patient: Joseph Raymond UQJ:[id number]  PCP: [name]  Date of admission: 07/23/2020   Date of discharge:  07/26/2020     Discharge Diagnoses:  Principal diagnosis Pyelonephritis Active Problems:   H/O mechanical aortic valve replacement   HTN (hypertension)   HLD (hyperlipidemia)   Type II diabetes mellitus with renal manifestations (HCC)   Pyelonephritis   Hyponatremia   Sepsis (HCC)   Admitted From: Home Disposition:  Home   Recommendations for Outpatient Follow-up:  1. PCP: 1 wk 2. 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 Hepresents to the emergency department for chief concerns of worsening bilateral low back painwith a high fever for duration of 4 days. He had MRI of lumbar and thoracic spine performed on 3/13, showed no evidence of discitis or osteomyelitis. # Sepsis. Ruled in, POA and Pyelonephritis. Ruled in, [place]. Pt is s/p Biosynthetic mitral and aortic valve. Patient had a history of endocarditis.  Blood cultures so far still negative.He has some heart murmur, trans  thoracic echocardiogram negative for vegetations. Condition most likely caused by pyelonephritis. Patient has been seen by ID, antibiotic switched to Rocephin. His condition appears to be improving, and he recommended Bactrim for 5 days and then resume amoxicillin 500 twice daily suppressive therapy which patient is taking at home. # Chronic thrombocytopenia.Stable. # Essential hypertension.Continue home medicine. #Type 2 diabetes uncontrolled with hyperglycemia, Hemoglobin A1c 7.1, continue current regimen.  Continue diabetic diet and follow with PCP for further management Body mass index is 36.03 kg/m.      Patient was ambulatory without any assistance. On the day of the discharge the patient's vitals were stable, and no other acute medical condition were reported by patient. the patient was felt safe to be discharge at Home.  Consultants: ID Procedures: none  Discharge Exam: General: Appear in no distress, no Rash; Oral Mucosa Clear, moist. Cardiovascular: S1 and S2 Present, no Murmur, Respiratory: normal respiratory effort, Bilateral Air entry present and no Crackles, no wheezes Abdomen: Bowel Sound present, Soft and no tenderness, no hernia Extremities: no Pedal edema, no calf tenderness Neurology: alert and oriented to time, place, and person affect appropriate.  Filed Weights   07/23/20 1314 07/23/20 2117  Weight: 103.4 kg 95.2 kg   Vitals:   07/26/20 0442 07/26/20 1015  BP: 113/72 111/68  Pulse: 61 61  Resp: 16 16  Temp: 98.4 F (36.9 C) 98.5 F (36.9 C)  SpO2: 97% 97%    DISCHARGE MEDICATION: Allergies as of 07/26/2020   No Known Allergies     Medication List    TAKE these medications   amoxicillin 500 MG capsule Commonly known as: AMOXIL Take 1 capsule (500 mg total) by mouth 2 (two) times daily. Start taking on: July 31, 2020 What changed:  These instructions start on July 31, 2020. If you are unsure what to do until then, ask your doctor or other care  provider.   atorvastatin 40 MG tablet Commonly known as: LIPITOR Take 40 mg by mouth daily.   furosemide 20 MG tablet Commonly known as: LASIX Take 20 mg by mouth daily.   losartan 50 MG tablet Commonly known as: COZAAR Take 50 mg by mouth daily.   metFORMIN 500 MG 24 hr tablet Commonly known as: GLUCOPHAGE-XR Take 500 mg by mouth daily.   pantoprazole 40 MG tablet Commonly known as: PROTONIX Take 1 tablet (40 mg total) by mouth 2 (two) times daily before a meal. What changed: when to take this   sulfamethoxazole-trimethoprim 800-160 MG tablet Commonly known as: BACTRIM DS Take 1 tablet by mouth every 12 (twelve) hours for 5 days.      No Known Allergies Discharge Instructions    Call MD for:  persistant nausea and vomiting   Complete by: As directed    Call MD for:  severe uncontrolled pain   Complete by: As directed    Call MD for:  temperature >100.4   Complete by: As directed    Diet - low sodium heart healthy   Complete by: As directed    Discharge instructions   Complete by: As directed    Follow-up with PCP in 1 week continue antibiotics as per ID recommendation Bactrim for 5 days and then resume amoxicillin 500 twice daily which patient is taking chronically.   Increase activity slowly   Complete by: As directed       The results of significant diagnostics from this hospitalization (including imaging, microbiology, ancillary and laboratory) are listed below for reference.    Significant Diagnostic Studies: MR THORACIC SPINE W WO CONTRAST  Result Date: 07/24/2020 CLINICAL DATA:  60 year old male with fever and back pain. 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 EXAM: MRI THORACIC WITHOUT AND WITH CONTRAST TECHNIQUE: Multiplanar and multiecho pulse sequences of the thoracic spine were obtained without and with intravenous contrast. CONTRAST:  29mL GADAVIST GADOBUTROL 1 MMOL/ML IV SOLN COMPARISON:  CT  lumbar spine, CT Abdomen and Pelvis 07/23/2020. Chest CT 10/04/2006. MRI lumbar spine today reported separately. FINDINGS: Limited cervical spine imaging: Evidence of chronic disc and endplate degeneration but otherwise grossly negative. Thoracic spine segmentation: Appears to be normal, hypoplastic ribs suspected at T12. Same numbering system as on the recent CT. Alignment: Relatively preserved thoracic kyphosis. No spondylolisthesis. Vertebrae: No marrow edema or evidence of acute osseous abnormality. Mildly heterogeneous background T1 marrow signal but no suspicious enhancement. No acute osseous abnormality identified. Cord: No spinal cord signal abnormality identified despite some degenerative spinal stenosis detailed below. Conus medullaris appears normal at T12-L1. No abnormal intradural enhancement.  No dural thickening. Paraspinal and other soft tissues: Negative. Disc levels: T1-T2: Mild facet hypertrophy.  No stenosis. T2-T3: Small right paracentral disc protrusion. No significant stenosis. T3-T4: Small central disc protrusion.  No significant stenosis. T4-T5: Large or central disc protrusion best seen on series 19, image 9. Mild spinal stenosis and mild cord mass effect. No foraminal involvement. T5-T6: Mild disc bulge and facet hypertrophy without stenosis. T6-T7: Right paracentral small disc protrusion. Mild facet hypertrophy. Borderline to mild spinal stenosis without cord mass effect (series 22 image 21). T7-T8: Moderate right paracentral disc or disc osteophyte complex. Mild facet hypertrophy. Mild spinal stenosis and cord mass effect (series 22, image 23). T8-T9: Similar left paracentral disc or  disc osteophyte complex. Mild facet hypertrophy. Mild spinal stenosis and left hemi cord mass effect. Mild left T8 foraminal stenosis. T9-T10: Mild disc bulging and facet hypertrophy greater on the left. No spinal stenosis. Moderate left T9 foraminal stenosis. T10-T11: Mild disc bulging eccentric to the  right. Mild facet hypertrophy. Mild right T10 foraminal stenosis. T11-T12: Abnormal disc space and superior T12 endplate which are compatible with healed discitis osteomyelitis. There is a degree of interbody ankylosis noted far laterally on the right. No stenosis. T12-L1: Mild disc bulge.  No stenosis. IMPRESSION: 1. No acute or inflammatory process identified in the thoracic spine. Sequelae of healed T11-T12 discitis osteomyelitis with no adverse features. 2. Advanced thoracic disc disease. Multilevel mild thoracic spinal stenosis and spinal cord mass effect (T4-T5, T7-T8, T8-T9) but no spinal cord signal abnormality. Moderate degenerative neural foraminal stenosis at the left T9 nerve level. Electronically Signed   By: Odessa Fleming M.D.   On: 07/24/2020 07:48   MR Lumbar Spine W Wo Contrast  Result Date: 07/24/2020 CLINICAL DATA:  61 year old male with fever and back pain. 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 EXAM: MRI LUMBAR SPINE WITHOUT AND WITH CONTRAST TECHNIQUE: Multiplanar and multiecho pulse sequences of the lumbar spine were obtained without and with intravenous contrast. CONTRAST:  9mL GADAVIST GADOBUTROL 1 MMOL/ML IV SOLN in conjunction with contrast enhanced imaging of the thoracic spine reported separately. COMPARISON:  CT lumbar spine 07/23/2020. Thoracic spine today reported separately. FINDINGS: Segmentation: Normal, the same numbering system used on the recent CT. Alignment: Stable from the recent CT. Lumbar lordosis with grade 1 anterolisthesis of L4 on L5. Vertebrae: Chronically obliterated disc at L4-L5 with evidence of posterior element ankylosis, compatible with sequelae of healed discitis osteomyelitis. No marrow edema or evidence of acute osseous abnormality. No abnormal vertebral enhancement. Background bone marrow signal within normal limits. Intact visible sacrum and SI joints. Conus medullaris and cauda equina: Conus extends  to the T12-L1 level. No lower spinal cord or conus signal abnormality. No abnormal intradural enhancement or dural thickening. Paraspinal and other soft tissues: Negative. Disc levels: T12-L1:  Reported with the thoracic spine today. L1-L2:  Minor disc bulging and facet hypertrophy.  No stenosis. L2-L3: Disc desiccation and disc space loss. Circumferential disc bulge and up to moderate posterior element hypertrophy. Moderate spinal and severe right lateral recess stenosis (right L3 nerve level). Mild L3 foraminal stenosis. L3-L4: Disc desiccation. Bulky circumferential disc bulge and up to moderate posterior element hypertrophy. Epidural lipomatosis. Moderate to severe spinal stenosis. Mild left but moderate to severe right L3 foraminal stenosis. L4-L5: Chronic anterolisthesis and ankylosis. Osseous mild spinal stenosis, moderate right L4 foraminal stenosis. L5-S1: Far lateral disc bulging and endplate spurring. Additional bulky right foraminal to extraforaminal component of disc (series 28, image 32). Left foraminal annular fissure of the disc. Moderate facet hypertrophy. No spinal or lateral recess stenosis. Moderate left and moderate to severe right L5 foraminal stenosis. IMPRESSION: 1. No acute or inflammatory process in the lumbar spine. Suspect healed discitis osteomyelitis at L5-S1 where there is spondylolisthesis and ankylosis. 2. Advanced disc and posterior element degeneration at L2-L3 and L3-L4 with moderate to severe spinal with severe stenosis at the descending and exiting right L3 nerve level. 3. Bulky right foraminal disc at L5-S1. Up to severe right and moderate left L5 foraminal stenosis. Electronically Signed   By: Odessa Fleming M.D.   On: 07/24/2020 08:15   CT L-SPINE NO CHARGE  Result Date: 07/23/2020  CLINICAL DATA:  Fever and painful urination for 2 days. Back pain. History of prior lumbar fusion. EXAM: CT LUMBAR SPINE WITHOUT CONTRAST TECHNIQUE: Multidetector CT imaging of the lumbar spine was  performed without intravenous contrast administration. Multiplanar CT image reconstructions were also generated. COMPARISON:  None. FINDINGS: Segmentation: There are five lumbar type vertebral bodies. The last full intervertebral disc space is labeled L5-S1. Alignment: Normal overall alignment of the lumbar vertebral bodies. There is slight anterolisthesis of L4 compared L5 but there is solid interbody fusion at this level in the facets are also fused at this level. Vertebrae: Superior endplate depression T12 is noted. This is of uncertain age. The lumbar vertebral bodies are intact. No acute fracture. Paraspinal and other soft tissues: No significant findings. Disc levels: T12-L1: No disc protrusions, spinal or foraminal stenosis. L1-2: Mild annular bulge with slight flattening of the ventral thecal sac but no significant spinal or foraminal stenosis. Mild/moderate facet disease. L2-3: Degenerative disc disease and diffuse bulging degenerated annulus in combination with short pedicles and advanced facet disease contributing to moderately severe to severe spinal and bilateral lateral recess stenosis. No significant foraminal stenosis. L3-4: Bulging degenerated annulus, short pedicles, advanced facet disease and ligamentum flavum thickening contributing to moderately severe to severe spinal and bilateral lateral recess stenosis. Mild foraminal stenosis also. L4-5: Interbody fusion changes are noted. Mild anterolisthesis of L4 with osteophytic spurring and advanced facet disease contributing to mild spinal and bilateral lateral recess stenosis. No significant foraminal stenosis. L5-S1: Advanced facet disease but no spinal or lateral recess stenosis. Mild left and moderate right foraminal stenosis mainly due to spurring changes. IMPRESSION: 1. Degenerative lumbar spondylosis with multilevel disc disease and advanced facet disease. 2. Moderately severe to severe spinal and bilateral lateral recess stenosis at L2-3 and  L3-4. 3. Mild spinal and bilateral lateral recess stenosis at L4-5. 4. Interbody fusion changes at L4-5. 5. Mild left and moderate right foraminal stenosis at L5-S1 mainly due to spurring changes. 6. Age indeterminate compression deformity of T12. Aortic Atherosclerosis (ICD10-I70.0). Electronically Signed   By: Rudie MeyerP.  Gallerani M.D.   On: 07/23/2020 16:28   DG Chest Port 1 View  Result Date: 07/23/2020 CLINICAL DATA:  Fever EXAM: PORTABLE CHEST 1 VIEW COMPARISON:  10/04/2006 FINDINGS: Median sternotomy with cardiac valve replacement. Heart size is mildly enlarged. Mild pulmonary vascular congestion. No focal airspace consolidation, pleural effusion, or pneumothorax. IMPRESSION: Cardiomegaly and mild pulmonary vascular congestion. No overt edema or focal airspace consolidation. Electronically Signed   By: Duanne GuessNicholas  Plundo D.O.   On: 07/23/2020 19:36   ECHOCARDIOGRAM COMPLETE  Result Date: 07/25/2020    ECHOCARDIOGRAM REPORT   Patient Name:   Yolonda KidaLEJANDRO Pavelko Date of Exam: 07/24/2020 Medical Rec #:  [id number]           Height:       64.0 in Accession #:    [id number]          Weight:       209.9 lb Date of Birth:  August 14, 1960           BSA:          1.997 m Patient Age:    59 years            BP:           111/68 mmHg Patient Gender: M                   HR:  61 bpm. Exam Location:  [facility] Procedure: 2D Echo, Cardiac Doppler and Color Doppler Indications:     Endocarditis I38  History:         Patient has prior history of Echocardiogram examinations. Risk                  Factors:Hypertension and Diabetes.  Sonographer:     Neysa Bonito Roar Referring Phys:  [id number] Marrion Coy Diagnosing Phys: Yvonne Kendall MD IMPRESSIONS  1. Left ventricular ejection fraction, by estimation, is 60 to 65%. The left ventricle has normal function. The left ventricle has no regional wall motion abnormalities. There is moderate left ventricular hypertrophy. Left ventricular diastolic parameters are indeterminate.  2. Right  ventricular systolic function is moderately reduced. The right ventricular size is normal.  3. Left atrial size was mild to moderately dilated.  4. The mitral valve has been repaired/replaced. Mild mitral valve regurgitation. Moderate mitral stenosis.  5. Moderate tricuspid stenosis.  6. The aortic valve has been repaired/replaced. There is moderate thickening of the aortic valve. Aortic valve regurgitation is mild. Moderate aortic valve stenosis. Aortic valve mean gradient measures 24.0 mmHg. Aortic valve Vmax measures 3.50 m/s. Conclusion(s)/Recommendation(s): No definite vegetation is seen, though there appears to be thickening and stenosis/regurgitation involving previously repaired/replaced valves. TEE recommended for further evaluation. FINDINGS  Left Ventricle: Left ventricular ejection fraction, by estimation, is 60 to 65%. The left ventricle has normal function. The left ventricle has no regional wall motion abnormalities. The left ventricular internal cavity size was normal in size. There is  moderate left ventricular hypertrophy. Left ventricular diastolic parameters are indeterminate. Right Ventricle: The right ventricular size is normal. No increase in right ventricular wall thickness. Right ventricular systolic function is moderately reduced. Left Atrium: Left atrial size was mild to moderately dilated. Right Atrium: Right atrial size was normal in size. Pericardium: There is no evidence of pericardial effusion. Mitral Valve: The mitral valve has been repaired/replaced. Mild mitral valve regurgitation. There is a bioprosthetic valve present in the mitral position. Moderate mitral valve stenosis. MV peak gradient, 18.4 mmHg. The mean mitral valve gradient is 8.0 mmHg. Tricuspid Valve: The tricuspid valve is not well visualized. Tricuspid valve regurgitation is mild . Moderate tricuspid stenosis. Aortic Valve: The aortic valve has been repaired/replaced. There is moderate thickening of the aortic valve.  Aortic valve regurgitation is mild. Moderate aortic stenosis is present. Aortic valve mean gradient measures 24.0 mmHg. Aortic valve peak gradient measures 48.9 mmHg. Aortic valve area, by VTI measures 1.82 cm. There is a bioprosthetic valve present in the aortic position. Pulmonic Valve: The pulmonic valve was not well visualized. Pulmonic valve regurgitation is trivial. No evidence of pulmonic stenosis. Aorta: The aortic root is normal in size and structure. Pulmonary Artery: The pulmonary artery is not well seen. Venous: The inferior vena cava was not well visualized. IAS/Shunts: The interatrial septum was not well visualized.  LEFT VENTRICLE PLAX 2D LVIDd:         4.80 cm  Diastology LVIDs:         3.30 cm  LV e' medial:    5.98 cm/s LV PW:         1.32 cm  LV E/e' medial:  38.5 LV IVS:        1.53 cm  LV e' lateral:   6.09 cm/s LVOT diam:     2.10 cm  LV E/e' lateral: 37.8 LV SV:         120 LV SV Index:  60 LVOT Area:     3.46 cm  RIGHT VENTRICLE RV Mid diam:    3.75 cm RV S prime:     8.49 cm/s TAPSE (M-mode): 1.3 cm LEFT ATRIUM             Index       RIGHT ATRIUM           Index LA diam:        4.20 cm 2.10 cm/m  RA Area:     17.00 cm LA Vol (A2C):   87.5 ml 43.82 ml/m RA Volume:   47.60 ml  23.84 ml/m LA Vol (A4C):   82.9 ml 41.51 ml/m LA Biplane Vol: 86.0 ml 43.07 ml/m  AORTIC VALVE                    PULMONIC VALVE AV Area (Vmax):    1.71 cm     PV Vmax:        1.17 m/s AV Area (Vmean):   1.83 cm     PV Peak grad:   5.5 mmHg AV Area (VTI):     1.82 cm     RVOT Peak grad: 5 mmHg AV Vmax:           349.50 cm/s AV Vmean:          223.000 cm/s AV VTI:            0.662 m AV Peak Grad:      48.9 mmHg AV Mean Grad:      24.0 mmHg LVOT Vmax:         173.00 cm/s LVOT Vmean:        118.000 cm/s LVOT VTI:          0.347 m LVOT/AV VTI ratio: 0.52  AORTA Ao Root diam: 3.20 cm MITRAL VALVE                TRICUSPID VALVE MV Area (PHT): 1.51 cm     TV Peak grad:   13.7 mmHg MV Area VTI:   1.23 cm     TV  Mean grad:   7.0 mmHg MV Peak grad:  18.4 mmHg    TV Vmax:        1.85 m/s MV Mean grad:  8.0 mmHg     TV Vmean:       131.0 cm/s MV Vmax:       2.14 m/s     TV VTI:         0.84 msec MV Vmean:      136.0 cm/s MV Decel Time: 502 msec     SHUNTS MV E velocity: 230.00 cm/s  Systemic VTI:  0.35 m MV A velocity: 193.00 cm/s  Systemic Diam: 2.10 cm MV E/A ratio:  1.19 MV A Prime:    10.0 cm/s Yvonne Kendall MD Electronically signed by Yvonne Kendall MD Signature Date/Time: 07/25/2020/4:49:28 PM    Final    CT Renal Stone Study  Result Date: 07/23/2020 CLINICAL DATA:  Bilateral flank pain and hematuria. EXAM: CT ABDOMEN AND PELVIS WITHOUT CONTRAST TECHNIQUE: Multidetector CT imaging of the abdomen and pelvis was performed following the standard protocol without IV contrast. COMPARISON:  None FINDINGS: Lower chest: The lung bases are clear. No pleural effusions or pulmonary lesions. The heart is normal in size. Prosthetic mitral and pulmonic valves are noted. No pericardial effusion. Hepatobiliary: Suspect cirrhotic changes involving the liver with an irregular liver contour, dilated hepatic fissures and right lobe  atrophy. Suspect portal venous hypertension and portal venous collaterals. No obvious hepatic lesions. The gallbladder is unremarkable. No common bile duct dilatation. Pancreas: No mass, inflammation or ductal dilatation. Spleen: Borderline splenomegaly.  No splenic lesions. Adrenals/Urinary Tract: The adrenal glands are unremarkable. Perinephric interstitial changes around both kidneys could be acute or chronic. No renal calculi or hydroureteronephrosis. No obstructing ureteral calculi. No bladder calculi. No worrisome renal or bladder lesions are identified without contrast. Stomach/Bowel: The stomach, duodenum, small bowel and colon are grossly normal. No acute inflammatory changes, mass lesions or obstructive findings. The terminal ileum and appendix are normal. Vascular/Lymphatic: Scattered  atherosclerotic calcifications but no aortic aneurysm. No mesenteric or retroperitoneal mass or adenopathy. Small scattered lymph nodes are noted. Reproductive: The prostate gland is enlarged. The seminal vesicles are unremarkable. Other: No free pelvic fluid collections. Small scattered pelvic lymph nodes are noted. Musculoskeletal: No significant bony findings. No bone lesions or acute fractures. The T12 fracture appears remote. Interbody fusion changes noted at L4-5. IMPRESSION: 1. No renal, ureteral or bladder calculi or mass. 2. Perinephric interstitial changes around both kidneys could be acute or chronic. Could not exclude the possibility of pyelonephritis. 3. Suspect cirrhotic changes involving the liver with portal venous hypertension, portal venous collaterals and borderline splenomegaly. No obvious hepatic lesions. 4. Enlarged prostate gland. 5. Small scattered retroperitoneal and pelvic lymph nodes which are indeterminate. A follow-up CT examination with contrast may be helpful for further evaluation. Electronically Signed   By: Rudie Meyer M.D.   On: 07/23/2020 16:40    Microbiology: Recent Results (from the past 240 hour(s))  Urine culture     Status: Abnormal   Collection Time: 07/23/20  3:20 PM   Specimen: Urine, Random  Result Value Ref Range Status   Specimen Description   Final    URINE, RANDOM Performed at [facility], [address] [place], [place] [address]    Special Requests   Final    NONE Performed at [facility], [address] [place], [place] [address]    Culture (A)  Final    <10,000 COLONIES/mL INSIGNIFICANT GROWTH Performed at [facility], [address], [place], [place] [address]    Report Status 07/24/2020 FINAL  Final  CULTURE, BLOOD (ROUTINE X 2) w Reflex to ID Panel     Status: None (Preliminary result)   Collection Time: 07/23/20  5:51 PM   Specimen: BLOOD  Result Value Ref Range Status   Specimen Description BLOOD  LEFT ANTECUBITAL  Final   Special Requests   Final    BOTTLES DRAWN AEROBIC AND ANAEROBIC Blood Culture adequate volume   Culture   Final    NO GROWTH 3 DAYS Performed at [facility], [address], [place], [place] [address]    Report Status PENDING  Incomplete  CULTURE, BLOOD (ROUTINE X 2) w Reflex to ID Panel     Status: None (Preliminary result)   Collection Time: 07/23/20  5:51 PM   Specimen: BLOOD  Result Value Ref Range Status   Specimen Description BLOOD RIGHT ANTECUBITAL  Final   Special Requests   Final    BOTTLES DRAWN AEROBIC AND ANAEROBIC Blood Culture adequate volume   Culture   Final    NO GROWTH 3 DAYS Performed at [facility], [address] [address] [place], [place] [address]    Report Status PENDING  Incomplete  SARS CORONAVIRUS 2 (TAT 6-24 HRS) Nasopharyngeal Nasopharyngeal Swab     Status: None  Collection Time: 07/23/20  8:38 PM   Specimen: Nasopharyngeal Swab  Result Value Ref Range Status   SARS Coronavirus 2 NEGATIVE NEGATIVE Final    Comment: (NOTE) SARS-CoV-2 target nucleic acids are NOT DETECTED.  The SARS-CoV-2 RNA is generally detectable in upper and lower respiratory specimens during the acute phase of infection. Negative results do not preclude SARS-CoV-2 infection, do not rule out co-infections with other pathogens, and should not be used as the sole basis for treatment or other patient management decisions. Negative results must be combined with clinical observations, patient history, and epidemiological information. The expected result is Negative.  Fact Sheet for Patients: HairSlick.no  Fact Sheet for Healthcare Providers: quierodirigir.com  This test is not yet approved or cleared by the [place] FDA and  has been authorized for detection and/or diagnosis of SARS-CoV-2 by FDA under an Emergency Use Authorization (EUA). This EUA will remain  in effect (meaning  this test can be used) for the duration of the COVID-19 declaration under Se ction 564(b)(1) of the Act, 21 U.S.C. section 360bbb-3(b)(1), unless the authorization is terminated or revoked sooner.  Performed at [facility], [address] [place], [place] [address]      Labs: CBC: Recent Labs  Lab 07/23/20 1322 07/26/20 0426  WBC 8.9 5.2  NEUTROABS 7.4 3.3  HGB 12.7* 11.9*  HCT 38.6* 36.5*  MCV 89.1 89.7  PLT 91* 80*   Basic Metabolic Panel: Recent Labs  Lab 07/23/20 1322 07/26/20 0426  NA 130* 136  K 3.9 3.8  CL 99 101  CO2 22 26  GLUCOSE 238* 109*  BUN 15 10  CREATININE 0.87 0.59*  CALCIUM 8.5* 8.5*  MG  --  2.1   Liver Function Tests: Recent Labs  Lab 07/25/20 0428 07/26/20 0426  AST 93* 75*  ALT 93* 103*  ALKPHOS 55 60  BILITOT 0.7 0.7  PROT 6.3* 6.7  ALBUMIN 3.1* 3.2*   No results for input(s): LIPASE, AMYLASE in the last 168 hours. No results for input(s): AMMONIA in the last 168 hours. Cardiac Enzymes: Recent Labs  Lab 07/23/20 1927 07/24/20 0451  CKTOTAL 448* 456*   BNP (last 3 results) No results for input(s): BNP in the last 8760 hours. CBG: Recent Labs  Lab 07/25/20 1141 07/25/20 1724 07/25/20 2040 07/26/20 0752 07/26/20 1137  GLUCAP 182* 225* 151* 122* 201*    Time spent: 35 minutes  Signed:  Gillis Santa  [facility]  07/26/2020 12:05 PM

## 2020-07-26 NOTE — Plan of Care (Signed)
  Problem: Education: Goal: Knowledge of General Education information will improve Description Including pain rating scale, medication(s)/side effects and non-pharmacologic comfort measures Outcome: Progressing   

## 2020-07-28 LAB — CULTURE, BLOOD (ROUTINE X 2)
Culture: NO GROWTH
Culture: NO GROWTH
Special Requests: ADEQUATE
Special Requests: ADEQUATE

## 2020-08-13 ENCOUNTER — Emergency Department
Admission: EM | Admit: 2020-08-13 | Discharge: 2020-08-13 | Disposition: A | Discharge status: Home / Self Care | Payer: Medicare Other | Attending: Emergency Medicine | Admitting: Emergency Medicine

## 2020-08-13 ENCOUNTER — Emergency Department: Payer: Medicare Other

## 2020-08-13 ENCOUNTER — Other Ambulatory Visit: Payer: Self-pay

## 2020-08-13 ENCOUNTER — Encounter: Payer: Self-pay | Admitting: Intensive Care

## 2020-08-13 DIAGNOSIS — N3000 Acute cystitis without hematuria: Secondary | ICD-10-CM

## 2020-08-13 DIAGNOSIS — L0390 Cellulitis, unspecified: Secondary | ICD-10-CM

## 2020-08-13 DIAGNOSIS — Z7984 Long term (current) use of oral hypoglycemic drugs: Secondary | ICD-10-CM | POA: Insufficient documentation

## 2020-08-13 DIAGNOSIS — E119 Type 2 diabetes mellitus without complications: Secondary | ICD-10-CM | POA: Insufficient documentation

## 2020-08-13 DIAGNOSIS — N50811 Right testicular pain: Secondary | ICD-10-CM | POA: Diagnosis present

## 2020-08-13 DIAGNOSIS — Z20822 Contact with and (suspected) exposure to covid-19: Secondary | ICD-10-CM | POA: Diagnosis not present

## 2020-08-13 DIAGNOSIS — I10 Essential (primary) hypertension: Secondary | ICD-10-CM | POA: Insufficient documentation

## 2020-08-13 DIAGNOSIS — Z79899 Other long term (current) drug therapy: Secondary | ICD-10-CM | POA: Diagnosis not present

## 2020-08-13 DIAGNOSIS — J449 Chronic obstructive pulmonary disease, unspecified: Secondary | ICD-10-CM | POA: Insufficient documentation

## 2020-08-13 DIAGNOSIS — N492 Inflammatory disorders of scrotum: Secondary | ICD-10-CM | POA: Diagnosis not present

## 2020-08-13 DIAGNOSIS — N50819 Testicular pain, unspecified: Secondary | ICD-10-CM

## 2020-08-13 LAB — CBC WITH DIFFERENTIAL/PLATELET
Abs Immature Granulocytes: 0.04 10*3/uL (ref 0.00–0.07)
Basophils Absolute: 0 10*3/uL (ref 0.0–0.1)
Basophils Relative: 0 %
Eosinophils Absolute: 0 10*3/uL (ref 0.0–0.5)
Eosinophils Relative: 0 %
HCT: 39.2 % (ref 39.0–52.0)
Hemoglobin: 12.9 g/dL — ABNORMAL LOW (ref 13.0–17.0)
Immature Granulocytes: 0 %
Lymphocytes Relative: 11 %
Lymphs Abs: 1.8 10*3/uL (ref 0.7–4.0)
MCH: 29.3 pg (ref 26.0–34.0)
MCHC: 32.9 g/dL (ref 30.0–36.0)
MCV: 89.1 fL (ref 80.0–100.0)
Monocytes Absolute: 1.5 10*3/uL — ABNORMAL HIGH (ref 0.1–1.0)
Monocytes Relative: 10 %
Neutro Abs: 12.6 10*3/uL — ABNORMAL HIGH (ref 1.7–7.7)
Neutrophils Relative %: 79 %
Platelets: 92 10*3/uL — ABNORMAL LOW (ref 150–400)
RBC: 4.4 MIL/uL (ref 4.22–5.81)
RDW: 14.7 % (ref 11.5–15.5)
WBC: 16 10*3/uL — ABNORMAL HIGH (ref 4.0–10.5)
nRBC: 0 % (ref 0.0–0.2)

## 2020-08-13 LAB — URINALYSIS, COMPLETE (UACMP) WITH MICROSCOPIC
Bilirubin Urine: NEGATIVE
Glucose, UA: NEGATIVE mg/dL
Ketones, ur: 20 mg/dL — AB
Nitrite: NEGATIVE
Protein, ur: NEGATIVE mg/dL
Specific Gravity, Urine: 1.018 (ref 1.005–1.030)
WBC, UA: >50 WBC/hpf — ABNORMAL HIGH (ref 0–5)
pH: 5 (ref 5.0–8.0)

## 2020-08-13 LAB — COMPREHENSIVE METABOLIC PANEL
ALT: 42 U/L (ref 0–44)
AST: 36 U/L (ref 15–41)
Albumin: 3.8 g/dL (ref 3.5–5.0)
Alkaline Phosphatase: 64 U/L (ref 38–126)
Anion gap: 11 (ref 5–15)
BUN: 13 mg/dL (ref 8–23)
CO2: 24 mmol/L (ref 22–32)
Calcium: 8.7 mg/dL — ABNORMAL LOW (ref 8.9–10.3)
Chloride: 101 mmol/L (ref 98–111)
Creatinine, Ser: 0.95 mg/dL (ref 0.61–1.24)
GFR, Estimated: >60 mL/min (ref >60)
Glucose, Bld: 143 mg/dL — ABNORMAL HIGH (ref 70–99)
Potassium: 3.5 mmol/L (ref 3.5–5.1)
Sodium: 136 mmol/L (ref 135–145)
Total Bilirubin: 1.6 mg/dL — ABNORMAL HIGH (ref 0.3–1.2)
Total Protein: 7.6 g/dL (ref 6.5–8.1)

## 2020-08-13 LAB — CHLAMYDIA/NGC RT PCR (ARMC ONLY)
Chlamydia Tr: NOT DETECTED
N gonorrhoeae: NOT DETECTED

## 2020-08-13 LAB — RESP PANEL BY RT-PCR (FLU A&B, COVID) ARPGX2
Influenza A by PCR: NEGATIVE
Influenza B by PCR: NEGATIVE
SARS Coronavirus 2 by RT PCR: NEGATIVE

## 2020-08-13 LAB — HEPATITIS PANEL, ACUTE
HCV Ab: NONREACTIVE
Hep A IgM: NONREACTIVE
Hep B C IgM: NONREACTIVE
Hepatitis B Surface Ag: NONREACTIVE

## 2020-08-13 LAB — PROCALCITONIN: Procalcitonin: 0.43 ng/mL

## 2020-08-13 LAB — LACTIC ACID, PLASMA: Lactic Acid, Venous: 1.4 mmol/L (ref 0.5–1.9)

## 2020-08-13 LAB — HIV ANTIBODY (ROUTINE TESTING W REFLEX): HIV Screen 4th Generation wRfx: NONREACTIVE

## 2020-08-13 MED ORDER — IOHEXOL 300 MG/ML  SOLN
100.0000 mL | Freq: Once | INTRAMUSCULAR | Status: AC | PRN
  Administered 2020-08-13: 100 mL via INTRAVENOUS

## 2020-08-13 MED ORDER — HYDROCODONE-ACETAMINOPHEN 5-325 MG PO TABS: 1.0000 | ORAL_TABLET | Freq: Four times a day (QID) | ORAL | 0 refills | Status: AC | PRN

## 2020-08-13 MED ORDER — LACTATED RINGERS IV BOLUS
1000.0000 mL | Freq: Once | INTRAVENOUS | Status: AC
  Administered 2020-08-13: 1000 mL via INTRAVENOUS

## 2020-08-13 MED ORDER — HYDROCODONE-ACETAMINOPHEN 5-325 MG PO TABS: 1.0000 | ORAL_TABLET | Freq: Four times a day (QID) | ORAL | 0 refills | Status: DC | PRN

## 2020-08-13 MED ORDER — SULFAMETHOXAZOLE-TRIMETHOPRIM 800-160 MG PO TABS: 1.0000 | ORAL_TABLET | Freq: Two times a day (BID) | ORAL | 0 refills | Status: DC

## 2020-08-13 MED ORDER — MORPHINE SULFATE (PF) 4 MG/ML IV SOLN
4.0000 mg | Freq: Once | INTRAVENOUS | Status: AC
  Administered 2020-08-13: 4 mg via INTRAVENOUS
  Filled 2020-08-13: qty 1

## 2020-08-13 MED ORDER — SULFAMETHOXAZOLE-TRIMETHOPRIM 800-160 MG PO TABS: 1.0000 | ORAL_TABLET | Freq: Two times a day (BID) | ORAL | 0 refills | Status: AC

## 2020-08-13 MED ORDER — SODIUM CHLORIDE 0.9 % IV SOLN
1.0000 g | Freq: Once | INTRAVENOUS | Status: AC
  Administered 2020-08-13: 1 g via INTRAVENOUS
  Filled 2020-08-13: qty 10

## 2020-08-13 MED ORDER — ACETAMINOPHEN 500 MG PO TABS
1000.0000 mg | ORAL_TABLET | Freq: Once | ORAL | Status: AC
  Administered 2020-08-13: 1000 mg via ORAL
  Filled 2020-08-13: qty 2

## 2020-08-13 NOTE — ED Triage Notes (Signed)
Interpretor used during triage. Pt c/o testicle swelling/pain and hard to walk that started three days ago.

## 2020-08-13 NOTE — ED Provider Notes (Signed)
[facility] Emergency Department Provider Note  ____________________________________________   Event Date/Time   First MD Initiated Contact with Patient 08/13/20 [phone]     (approximate)  I have reviewed the triage vital signs and the nursing notes.   HISTORY  Chief Complaint Testicle Pain   HPI Joseph Raymond is a 61 y.o. 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  States he has had a swollen scrotum in the past but never pain tenderness or redness.  States he thinks he had a fever last night but did not measure it.  He denies any abdominal pain, back pain but does endorse some burning with urination.  No blood in his urine, diarrhea, blood in the stool, rash or other acute sick symptoms.  No headache, earache, sore throat, nausea, vomiting, chest pain, cough, shortness of breath, rash or extremity pain.  No prior similar episodes.  No clearly fitting aggravating factors.  Patient states he is sexually active and wishes to be tested for STDs today.         Past Medical History:  Diagnosis Date  . Chronic back pain   . Diabetes mellitus without complication (HCC)   . Hypertension   . Lumbar radiculopathy     Patient Active Problem List   Diagnosis Date Noted  . Hyponatremia 07/25/2020  . Sepsis (HCC)  07/25/2020  . Pyelonephritis 07/23/2020  . Symptomatic anemia 09/29/2019  . GIB (gastrointestinal bleeding) 09/29/2019  . H/O mechanical aortic valve replacement 09/29/2019  . HTN (hypertension) 09/29/2019  . HLD (hyperlipidemia) 09/29/2019  . Type II diabetes mellitus with renal manifestations (HCC) 09/29/2019  . Iron deficiency anemia 09/29/2019    Past Surgical History:  Procedure Laterality Date  . ESOPHAGOGASTRODUODENOSCOPY N/A 10/01/2019   Procedure: ESOPHAGOGASTRODUODENOSCOPY (EGD);  Surgeon: Toledo, Boykin Nearing, MD;  Location: [facility];  Service: Gastroenterology;  Laterality: N/A;  . KNEE SURGERY      Prior to Admission medications   Medication Sig Start Date End Date Taking? Authorizing Provider  amoxicillin (AMOXIL) 500 MG capsule Take 1 capsule (500 mg total) by mouth 2 (two) times daily. 07/31/20   Gillis Santa, MD  atorvastatin (LIPITOR) 40 MG tablet Take 40 mg by mouth daily.    [provider]  furosemide (LASIX) 20 MG tablet Take 20 mg by mouth daily.    [provider]  HYDROcodone-acetaminophen (NORCO) 5-325 MG tablet Take 1 tablet by mouth every 6 (six) hours as needed for up to 3 days for severe pain. 08/13/20 08/16/20  Gilles Chiquito, MD  losartan (COZAAR) 50 MG tablet Take 50 mg by mouth daily.    [provider]  metFORMIN (GLUCOPHAGE-XR) 500 MG 24 hr tablet Take 500 mg by mouth daily.    [provider]  pantoprazole (PROTONIX) 40 MG tablet Take 1 tablet (40 mg total) by mouth 2 (two) times daily before a meal. Patient taking differently: Take 40 mg by mouth daily. 10/02/19   Rolly Salter, MD  sulfamethoxazole-trimethoprim (BACTRIM  DS) 800-160 MG tablet Take 1 tablet by mouth 2 (two) times daily for 14 days. 08/13/20 08/27/20  Gilles Chiquito, MD    Allergies Patient has no known allergies.  Family History  Problem Relation Age of Onset  . Diabetes Mellitus II Mother     Social History Social History   Tobacco Use   . Smoking status: Never Smoker  . Smokeless tobacco: Never Used  Substance Use Topics  . Alcohol use: Not Currently    Alcohol/week: 2.0 standard drinks    Types: 2 Cans of beer per week    Comment: occ  . Drug use: No    Review of Systems  Review of Systems  Constitutional: Positive for fever and malaise/fatigue. Negative for chills.  HENT: Negative for sore throat.   Eyes: Negative for pain.  Respiratory: Negative for cough and stridor.   Cardiovascular: Negative for chest pain.  Gastrointestinal: Negative for vomiting.  Genitourinary: Positive for dysuria. Negative for flank pain, frequency and hematuria.  Musculoskeletal: Negative for myalgias.  Skin: Negative for rash.  Neurological: Negative for seizures, loss of consciousness and headaches.  Psychiatric/Behavioral: Negative for suicidal ideas.  All other systems reviewed and are negative.     ____________________________________________   PHYSICAL EXAM:  VITAL SIGNS: ED Triage Vitals  Enc Vitals Group     BP 08/13/20 0907 106/71     Pulse Rate 08/13/20 0907 80     Resp 08/13/20 0907 16     Temp 08/13/20 0907 100.1 F (37.8 C)     Temp Source 08/13/20 0907 Oral     SpO2 08/13/20 0907 95 %     Weight 08/13/20 0908 216 lb (98 kg)     Height 08/13/20 0908  (1.626 m)     Head Circumference --      Peak Flow --      Pain Score --      Pain Loc --      Pain Edu? --      Excl. in GC? --    Vitals:   08/13/20 1203 08/13/20 1300  BP: 102/62 128/80  Pulse: (!) 56 63  Resp: 16 14  Temp:    SpO2: 96% 97%   Physical Exam Vitals and nursing note reviewed. Exam conducted with a chaperone present.  Constitutional:      Appearance: He is well-developed. He is obese.  HENT:     Head: Normocephalic and atraumatic.     Right Ear: External ear normal.     Left Ear: External ear normal.     Nose: Nose normal.  Eyes:     Conjunctiva/sclera: Conjunctivae normal.  Cardiovascular:     Rate and Rhythm: Normal  rate and regular rhythm.     Heart sounds: No murmur heard.   Pulmonary:     Effort: Pulmonary effort is normal. No respiratory distress.     Breath sounds: Normal breath sounds.  Abdominal:     Palpations: Abdomen is soft.     Tenderness: There is no abdominal tenderness.     Hernia: There is no hernia in the left inguinal area or right inguinal area.  Genitourinary:    Penis: No erythema, discharge or lesions.      Testes:        Right: Tenderness and swelling present.        Left: Tenderness and swelling present.     Epididymis:     Right: Inflamed. Tenderness present.     Left: Inflamed. Tenderness  present.  Musculoskeletal:     Cervical back: Neck supple.  Skin:    General: Skin is warm and dry.  Neurological:     Mental Status: He is alert and oriented to person, place, and time.  Psychiatric:        Mood and Affect: Mood normal.      ____________________________________________   LABS (all labs ordered are listed, but only abnormal results are displayed)  Labs Reviewed  CBC WITH DIFFERENTIAL/PLATELET - Abnormal; Notable for the following components:      Result Value   WBC 16.0 (*)    Hemoglobin 12.9 (*)    Platelets 92 (*)    Neutro Abs 12.6 (*)    Monocytes Absolute 1.5 (*)    All other components within normal limits  COMPREHENSIVE METABOLIC PANEL - Abnormal; Notable for the following components:   Glucose, Bld 143 (*)    Calcium 8.7 (*)    Total Bilirubin 1.6 (*)    All other components within normal limits  URINALYSIS, COMPLETE (UACMP) WITH MICROSCOPIC - Abnormal; Notable for the following components:   Color, Urine AMBER (*)    APPearance CLOUDY (*)    Hgb urine dipstick MODERATE (*)    Ketones, ur 20 (*)    Leukocytes,Ua MODERATE (*)    WBC, UA >50 (*)    Bacteria, UA FEW (*)    All other components within normal limits  CHLAMYDIA/NGC RT PCR ([facility] ONLY)  RESP PANEL BY RT-PCR (FLU A&B, COVID) ARPGX2  URINE CULTURE  CULTURE, BLOOD  (ROUTINE X 2)  CULTURE, BLOOD (ROUTINE X 2)  LACTIC ACID, PLASMA  PROCALCITONIN  HIV ANTIBODY (ROUTINE TESTING W REFLEX)  HEPATITIS PANEL, ACUTE  RPR   ____________________________________________  EKG  Sinus rhythm with a ventricular of 77, normal axis, unremarkable intervals and no acute evidence of acute ischemia or other significant underlying arrhythmia. ____________________________________________  RADIOLOGY   ED MD interpretation: CT abdomen pelvis shows large cystic lesion in the left hemiscrotum and some stranding around the left perinephric space without abscess or other clear acute abdominopelvic pathology.  No kidney stone, appendicitis, or diverticulitis.  Testicular ultrasound shows large left-sided cyst approximately 7.3 cm without evidence of torsion epididymitis orchitis.  Official radiology report(s): CT ABDOMEN PELVIS W CONTRAST  Result Date: 08/13/2020 CLINICAL DATA:  Testicular pain and swelling. EXAM: CT ABDOMEN AND PELVIS WITH CONTRAST TECHNIQUE: Multidetector CT imaging of the abdomen and pelvis was performed using the standard protocol following bolus administration of intravenous contrast. CONTRAST:  OMNIPAQUE IOHEXOL 300 MG/ML  SOLN COMPARISON:  Scrotal ultrasound same day FINDINGS: Lower chest: Lung bases are clear. Hepatobiliary: Fine nodular contour to the liver. Caudate lobe prominent. Enhancing hepatic lesion. Gallbladder normal Pancreas: Pancreas is normal. No ductal dilatation. No pancreatic inflammation. Spleen: Normal spleen Adrenals/urinary tract: Adrenal glands normal. Kidneys enhance uniformly. Ureteral obstruction. Bladder normal. There is mild perinephric stranding within the LEFT peritoneal space. Stomach/Bowel: Stomach, small bowel, appendix, and cecum are normal. The colon and rectosigmoid colon are normal. Vascular/Lymphatic: Abdominal aorta is normal caliber. No periportal or retroperitoneal adenopathy. No pelvic adenopathy. Reproductive:  Prostate normal. Large cystic lesion in the LEFT hemiscrotum measuring 8.4 cm cyst described on comparison ultrasound same day. Entire LEFT hemiscrotum measures 11.9 x 7.5 x 8.4 cm. No inguinal hernia. Other: No free fluid. Musculoskeletal: No aggressive osseous lesion. IMPRESSION: 1. Large cystic lesion within the LEFT hemiscrotum as described on comparison ultrasound same day. 2. Mild stranding in the LEFT perinephric space. No clear evidence  of urinary tract infection. 3. No obstructive uropathy. Electronically Signed   By: Genevive BiStewart  Edmunds M.D.   On: 08/13/2020 12:11   US SCROTUM W/DOPPLER  Result Date: 08/13/2020 CLINICAL DATA:  Testicular pain for 3 days. EXAM: SCROTAL ULTRASOUND DOPPLER ULTRASOUND OF THE TESTICLES TECHNIQUE: Complete ultrasound examination of the testicles, epididymis, and other scrotal structures was performed. Color and spectral Doppler ultrasound were also utilized to evaluate blood flow to the testicles. COMPARISON:  None. FINDINGS: Right testicle Measurements: 4.7 x 2.7 x 3.1 cm. No mass or microlithiasis visualized. Left testicle Measurements: 3.8 x 3.2 x 3.8 cm. No mass or microlithiasis visualized. Right epididymis: Dominant right epididymal head 3.7 x 3.6 x 3.6 cm cyst with low level internal echoes compatible with spermatocele. Otherwise normal. Left epididymis: Large simple 7.3 x 6.8 x 7.0 cm upper left scrotal cyst adjacent to and potentially arising from the left epididymal head, compatible with simple cyst versus spermatocele. Hydrocele:  Small left hydrocele.  No significant right hydrocele. Varicocele:  None visualized. Pulsed Doppler interrogation of both testes demonstrates normal low resistance arterial and venous waveforms bilaterally. IMPRESSION: 1. Normal testes.  No evidence of testicular torsion. 2. Large simple 7.3 cm upper left scrotal cyst adjacent to and potentially arising from the left epididymal head, compatible with simple cyst versus spermatocele. 3.  Dominant right epididymal head 3.7 cm cyst with low-level internal echoes, compatible with spermatocele. 4. Small left hydrocele. Electronically Signed   By: Delbert PhenixJason A Poff M.D.   On: 08/13/2020 10:30    ____________________________________________   PROCEDURES  Procedure(s) performed (including Critical Care):  .1-3 Lead EKG Interpretation Performed by: Gilles ChiquitoSmith, Amariss Detamore P, MD Authorized by: Gilles ChiquitoSmith, Emonnie Cannady P, MD     Interpretation: normal     ECG rate assessment: normal     Rhythm: sinus rhythm     Ectopy: none     Conduction: normal       ____________________________________________   INITIAL IMPRESSION / ASSESSMENT AND PLAN / ED COURSE     Patient presents with above to history exam for assessment of couple days of worsening nontraumatic bilateral scrotal swelling redness pain and subjective fever.  On arrival his temperature is elevated 100.1 with otherwise stable vital signs on room air.  Exam as above remarkable for fairly swollen tender scrotum bilaterally.  No evidence of hernia bilaterally.  Penile shaft itself has no lesions.  His abdomen is soft nontender and he has no CVA tenderness.  No perineal tenderness or abnormal findings to suggest Fournier's gangrene at the time.  Primary differential includes torsion, epididymitis, orchitis, cellulitis, and cystitis.  CT abdomen pelvis shows large cystic lesion in the left hemiscrotum and some stranding around the left perinephric space without abscess or other clear acute abdominopelvic pathology.  No kidney stone, appendicitis, or diverticulitis.  Testicular ultrasound shows large left-sided cyst approximately 7.3 cm without evidence of torsion epididymitis orchitis.  CBC remarkable for WBC count of 16 with hemoglobin at baseline and platelets at baseline as well.  CMP shows no significant electrolyte or metabolic derangements.  UA does appear infected with moderate hemoglobin, moderate leukocyte esterase and 3 WBCs.  Lactic acid  is nonelevated at 1.4.  Urine culture sent.  Pro-Cal is elevated consistent with acute bacterial infection at 0.43.  Patient did request STD testing and RPR hepatitis and HIV testing was sent but advised patient to follow-up these results with his PCP.  Covid is negative.  Patient has no CVA tenderness on exam he does have some left-sided perinephric  stranding noted above and given elevated blood cell count and UA concerning for infection will treat for pyelonephritis.  Scrotal exam most consistent with scrotal cellulitis in the setting of otherwise reassuring ultrasound.  Given stable vitals with otherwise reassuring exam and work-up and low suspicion for sepsis after ED work-up is complete believe he is safe for discharge with outpatient urology follow-up.  Discussed concern for possible scrotal cellulitis with on-call urologist Dr. Lonna Cobb who said he would see patient in clinic.  Will write Rx for Bactrim as well as short course of analgesic.  Patient discharged stable condition.  Strict return cautions advised and discussed.      ____________________________________________   FINAL CLINICAL IMPRESSION(S) / ED DIAGNOSES  Final diagnoses:  Testicular pain  Acute cystitis without hematuria  Cellulitis, unspecified cellulitis site    Medications  morphine 4 MG/ML injection 4 mg (4 mg Intravenous Given 08/13/20 1022)  lactated ringers bolus 1,000 mL (0 mLs Intravenous Stopped 08/13/20 1112)  cefTRIAXone (ROCEPHIN) 1 g in sodium chloride 0.9 % 100 mL IVPB (0 g Intravenous Stopped 08/13/20 1201)  acetaminophen (TYLENOL) tablet 1,000 mg (1,000 mg Oral Given 08/13/20 1119)  iohexol (OMNIPAQUE) 300 MG/ML solution 100 mL (100 mLs Intravenous Contrast Given 08/13/20 1131)     ED Discharge Orders         Ordered    sulfamethoxazole-trimethoprim (BACTRIM DS) 800-160 MG tablet  2 times daily,   Status:  Discontinued        08/13/20 1242    HYDROcodone-acetaminophen (NORCO) 5-325 MG tablet  Every 6 hours  PRN,   Status:  Discontinued        08/13/20 1242    HYDROcodone-acetaminophen (NORCO) 5-325 MG tablet  Every 6 hours PRN        08/13/20 1315    sulfamethoxazole-trimethoprim (BACTRIM DS) 800-160 MG tablet  2 times daily        08/13/20 1315           Note:  This document was prepared using Dragon voice recognition software and may include unintentional dictation errors.   Gilles Chiquito, MD 08/13/20 1318

## 2020-08-14 LAB — RPR: RPR Ser Ql: NONREACTIVE

## 2020-08-15 LAB — URINE CULTURE: Culture: >=100000 — AB

## 2020-08-16 NOTE — Consult Note (Signed)
Discussed case with Dr. Cyril Loosen. Pt is growing e.coli in urine culture, sent home on TMP/SMX. Resistant to TMP/SMX and ampicillin. After discussed with MD, plan is to call in prescription for ciprofloxacin 500 mg BID x 10 days. Script called into [facility] on [address]. Called patient and he is aware of the script and will pick it up after work.   Thanks, Paschal Dopp, PharmD, BCPS

## 2020-08-18 LAB — CULTURE, BLOOD (ROUTINE X 2)
Culture: NO GROWTH
Culture: NO GROWTH
Special Requests: ADEQUATE
Special Requests: ADEQUATE

## 2022-04-07 IMAGING — CT CT RENAL STONE PROTOCOL
2 of 4 series · 16 of 46 positions shown, 18 images · non-contrast
Comparison: None

CLINICAL DATA: Bilateral flank pain and hematuria.

EXAM:
CT ABDOMEN AND PELVIS WITHOUT CONTRAST
TECHNIQUE: Multidetector CT imaging of the abdomen and pelvis was performed
following the standard protocol without IV contrast.

[Series 2: stone full standard · axial · 0.83mm/px · z∈[-561,-121]mm · 13 of 96 slices shown, 15 images]
[im 4/96  soft-tissue]
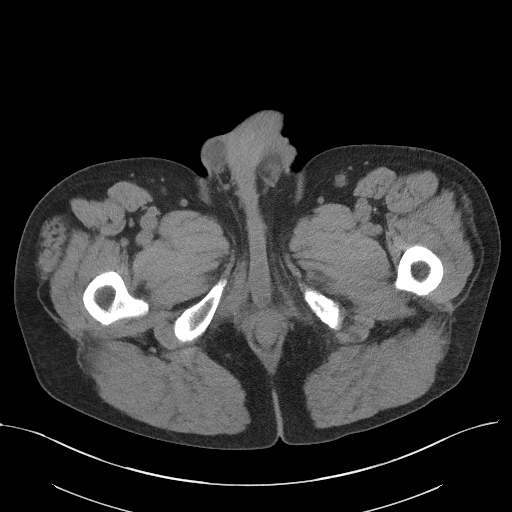
[im 4/96  bone]
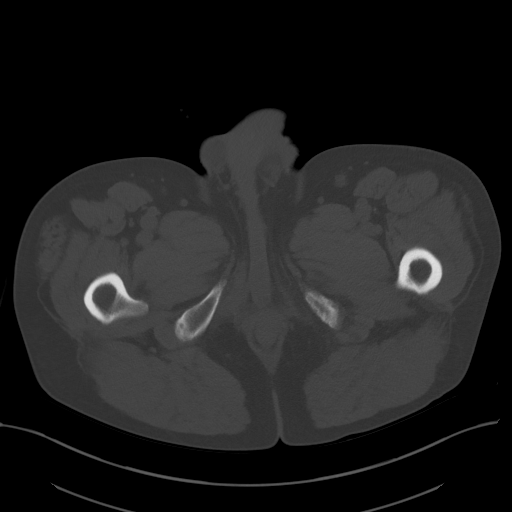
[im 12/96  soft-tissue]
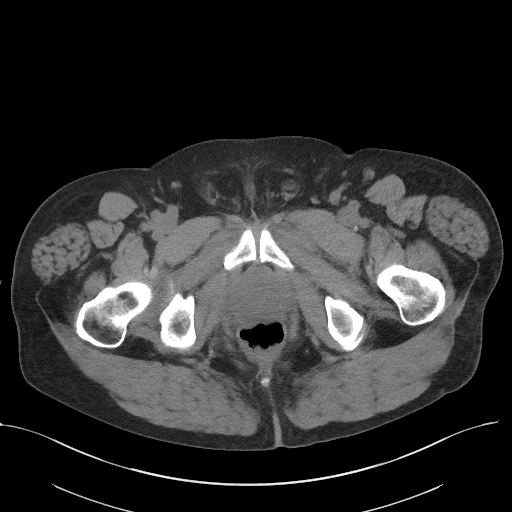
[im 20/96  soft-tissue]
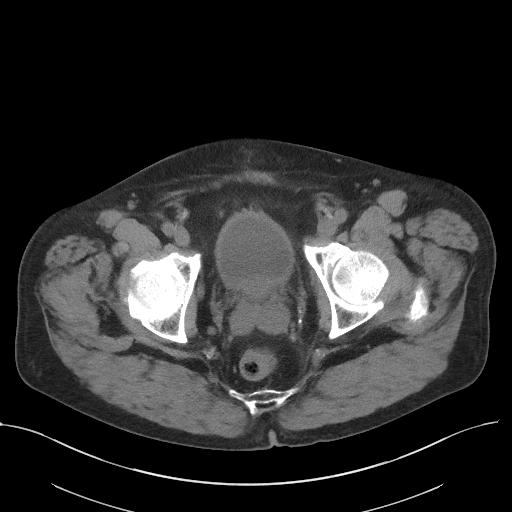
[im 28/96  soft-tissue]
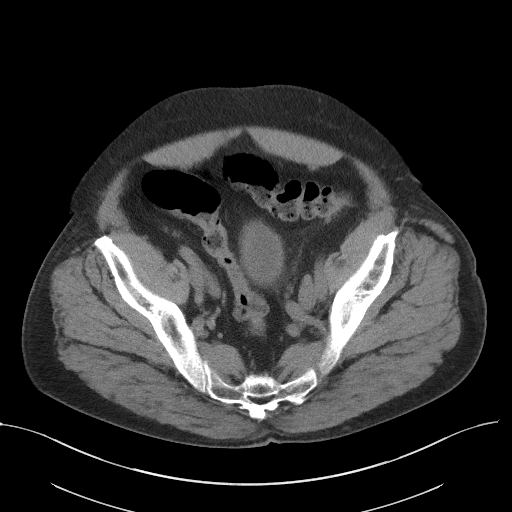
[im 32/96  soft-tissue]
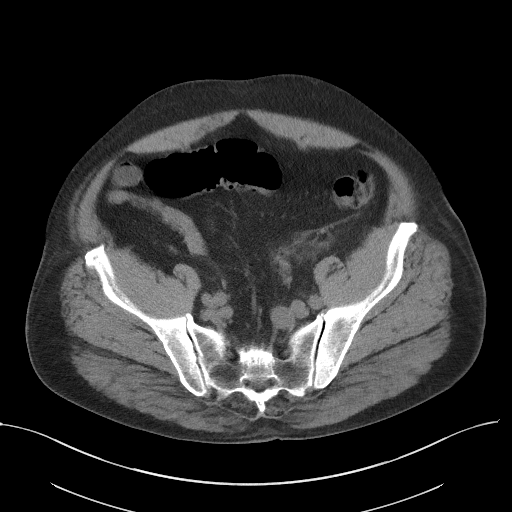
[im 40/96  soft-tissue]
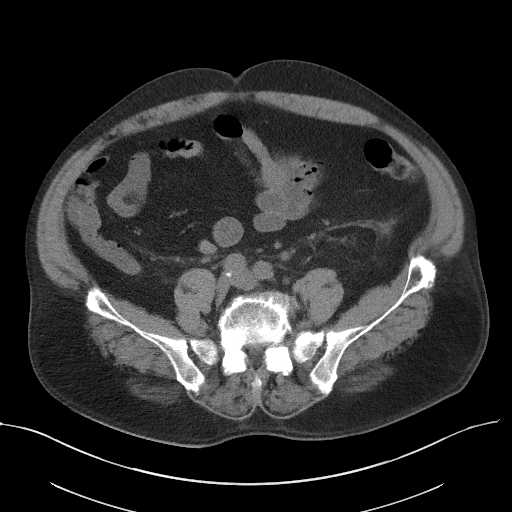
[im 48/96  soft-tissue]
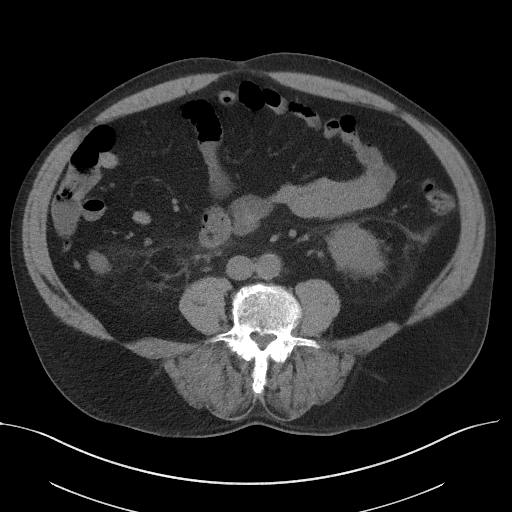
[im 56/96  soft-tissue]
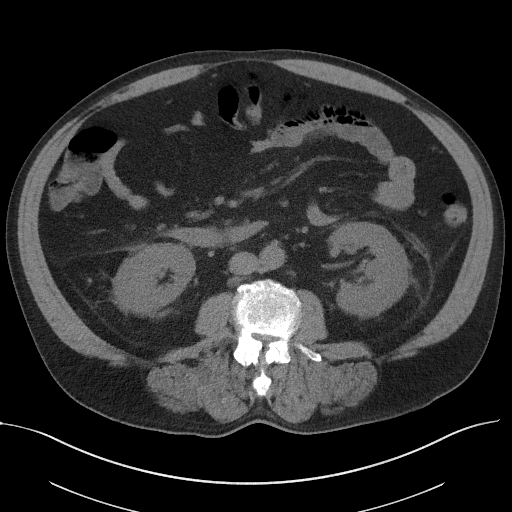
[im 64/96  soft-tissue]
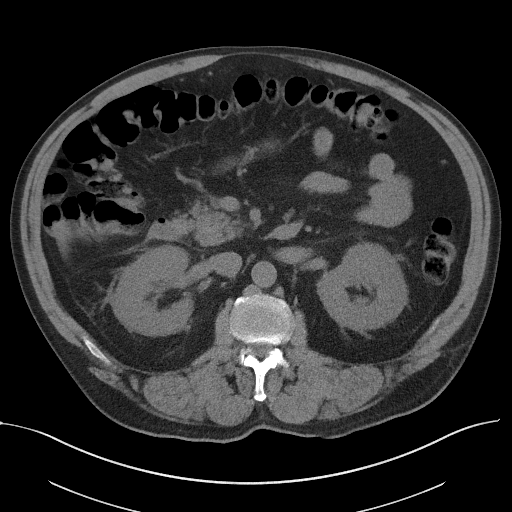
[im 64/96  bone]
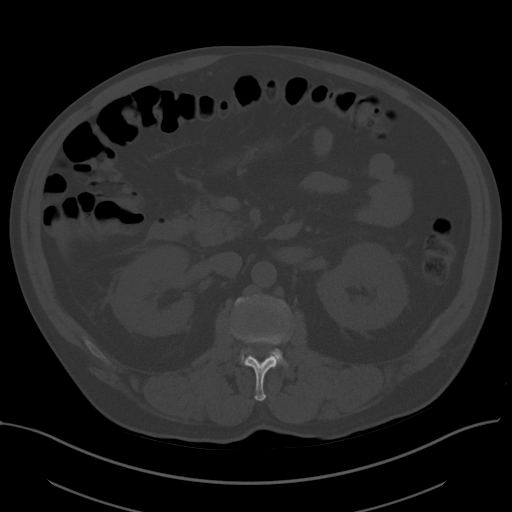
[im 68/96  soft-tissue]
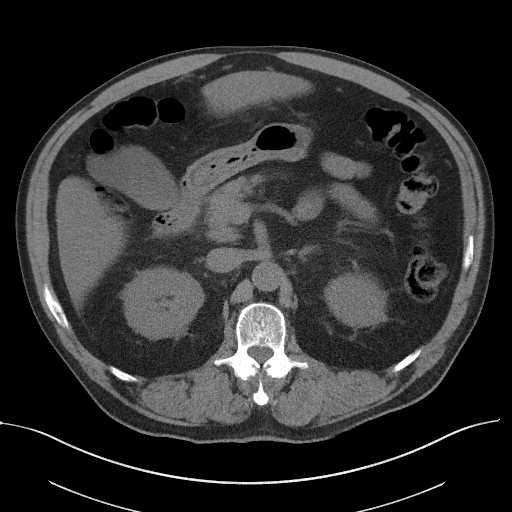
[im 76/96  soft-tissue]
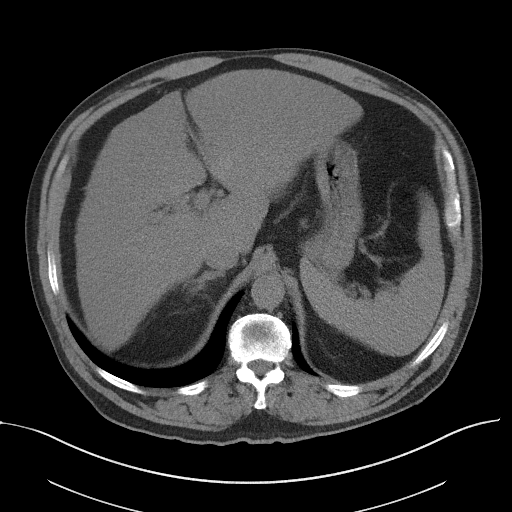
[im 84/96  soft-tissue]
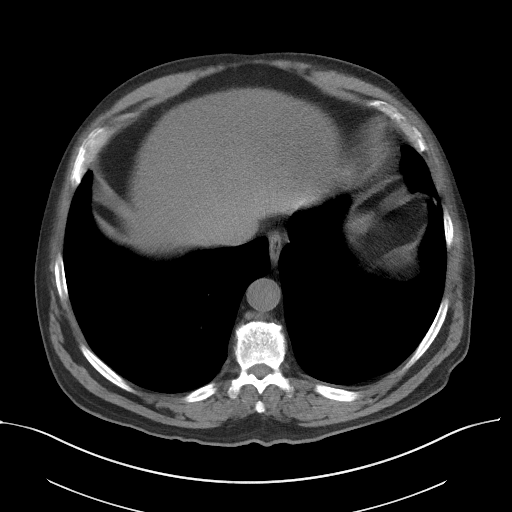
[im 92/96  soft-tissue]
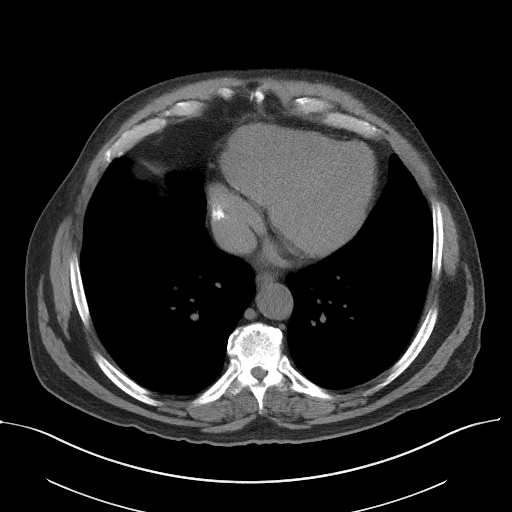

[Series 5: coronal · coronal · 0.83mm/px · 3 of 170 slices shown]
[im 57/170  soft-tissue]
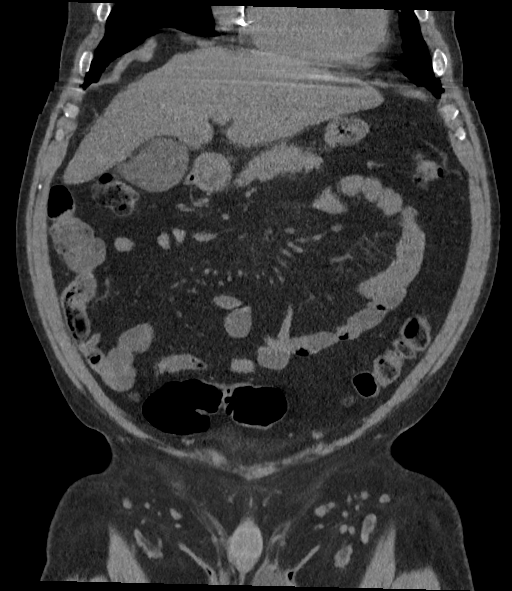
[im 76/170  soft-tissue]
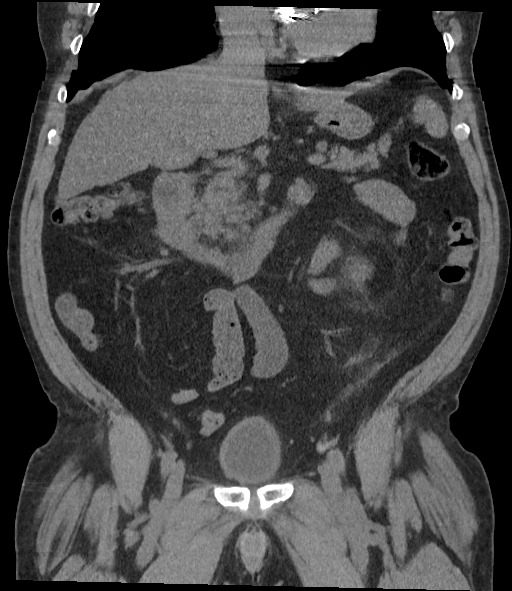
[im 94/170  soft-tissue]
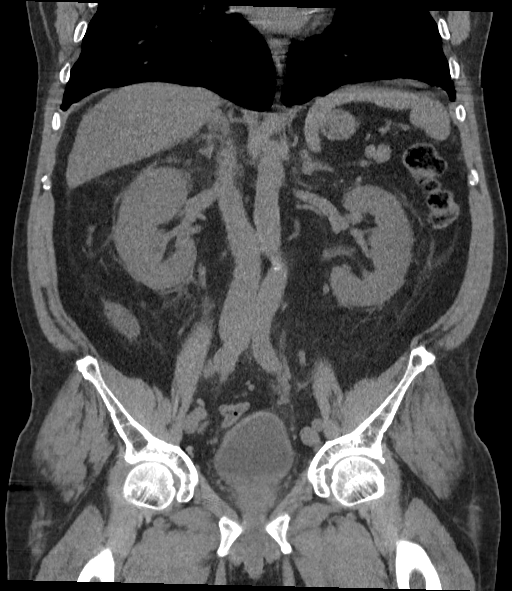

[16 of 46 positions shown; findings below may reference images not displayed]

FINDINGS: Lower chest: The lung bases are clear. No pleural effusions or
pulmonary lesions. The heart is normal in size. Prosthetic mitral
and pulmonic valves are noted. No pericardial effusion.

Hepatobiliary: Suspect cirrhotic changes involving the liver with an
irregular liver contour, dilated hepatic fissures and right lobe
atrophy. Suspect portal venous hypertension and portal venous
collaterals. No obvious hepatic lesions. The gallbladder is
unremarkable. No common bile duct dilatation.

Pancreas: No mass, inflammation or ductal dilatation.

Spleen: Borderline splenomegaly.  No splenic lesions.

Adrenals/Urinary Tract: The adrenal glands are unremarkable.

Perinephric interstitial changes around both kidneys could be acute
or chronic. No renal calculi or hydroureteronephrosis. No
obstructing ureteral calculi. No bladder calculi.

No worrisome renal or bladder lesions are identified without
contrast.

Stomach/Bowel: The stomach, duodenum, small bowel and colon are
grossly normal. No acute inflammatory changes, mass lesions or
obstructive findings. The terminal ileum and appendix are normal.

Vascular/Lymphatic: Scattered atherosclerotic calcifications but no
aortic aneurysm. No mesenteric or retroperitoneal mass or
adenopathy. Small scattered lymph nodes are noted.

Reproductive: The prostate gland is enlarged. The seminal vesicles
are unremarkable.

Other: No free pelvic fluid collections. Small scattered pelvic
lymph nodes are noted.

Musculoskeletal: No significant bony findings. No bone lesions or
acute fractures. The T12 fracture appears remote. Interbody fusion
changes noted at L4-5.
IMPRESSION: 1. No renal, ureteral or bladder calculi or mass.
2. Perinephric interstitial changes around both kidneys could be
acute or chronic. Could not exclude the possibility of
pyelonephritis.
3. Suspect cirrhotic changes involving the liver with portal venous
hypertension, portal venous collaterals and borderline splenomegaly.
No obvious hepatic lesions.
4. Enlarged prostate gland.
5. Small scattered retroperitoneal and pelvic lymph nodes which are
indeterminate. A follow-up CT examination with contrast may be
helpful for further evaluation.

## 2022-10-07 ENCOUNTER — Inpatient Hospital Stay
Admission: EM | Admit: 2022-10-07 | Discharge: 2022-10-10 | DRG: 378 | Disposition: A | Discharge status: Home / Self Care | Payer: Medicare HMO | Attending: Internal Medicine | Admitting: Internal Medicine

## 2022-10-07 ENCOUNTER — Encounter: Payer: Self-pay | Admitting: Emergency Medicine

## 2022-10-07 ENCOUNTER — Other Ambulatory Visit: Payer: Self-pay

## 2022-10-07 DIAGNOSIS — R791 Abnormal coagulation profile: Secondary | ICD-10-CM | POA: Diagnosis present

## 2022-10-07 DIAGNOSIS — I272 Pulmonary hypertension, unspecified: Secondary | ICD-10-CM | POA: Diagnosis present

## 2022-10-07 DIAGNOSIS — Z833 Family history of diabetes mellitus: Secondary | ICD-10-CM

## 2022-10-07 DIAGNOSIS — I491 Atrial premature depolarization: Secondary | ICD-10-CM | POA: Diagnosis present

## 2022-10-07 DIAGNOSIS — J439 Emphysema, unspecified: Secondary | ICD-10-CM | POA: Diagnosis present

## 2022-10-07 DIAGNOSIS — Z7901 Long term (current) use of anticoagulants: Secondary | ICD-10-CM

## 2022-10-07 DIAGNOSIS — Z79899 Other long term (current) drug therapy: Secondary | ICD-10-CM

## 2022-10-07 DIAGNOSIS — I4891 Unspecified atrial fibrillation: Secondary | ICD-10-CM | POA: Diagnosis present

## 2022-10-07 DIAGNOSIS — E669 Obesity, unspecified: Secondary | ICD-10-CM | POA: Diagnosis present

## 2022-10-07 DIAGNOSIS — Z6839 Body mass index (BMI) 39.0-39.9, adult: Secondary | ICD-10-CM | POA: Diagnosis not present

## 2022-10-07 DIAGNOSIS — Z8679 Personal history of other diseases of the circulatory system: Secondary | ICD-10-CM

## 2022-10-07 DIAGNOSIS — K921 Melena: Secondary | ICD-10-CM

## 2022-10-07 DIAGNOSIS — I10 Essential (primary) hypertension: Secondary | ICD-10-CM | POA: Diagnosis present

## 2022-10-07 DIAGNOSIS — K31811 Angiodysplasia of stomach and duodenum with bleeding: Secondary | ICD-10-CM | POA: Diagnosis not present

## 2022-10-07 DIAGNOSIS — D62 Acute posthemorrhagic anemia: Secondary | ICD-10-CM | POA: Diagnosis present

## 2022-10-07 DIAGNOSIS — Z7984 Long term (current) use of oral hypoglycemic drugs: Secondary | ICD-10-CM | POA: Diagnosis not present

## 2022-10-07 DIAGNOSIS — E785 Hyperlipidemia, unspecified: Secondary | ICD-10-CM | POA: Diagnosis present

## 2022-10-07 DIAGNOSIS — D649 Anemia, unspecified: Secondary | ICD-10-CM

## 2022-10-07 DIAGNOSIS — K922 Gastrointestinal hemorrhage, unspecified: Secondary | ICD-10-CM | POA: Diagnosis not present

## 2022-10-07 DIAGNOSIS — D696 Thrombocytopenia, unspecified: Secondary | ICD-10-CM | POA: Diagnosis present

## 2022-10-07 DIAGNOSIS — E119 Type 2 diabetes mellitus without complications: Secondary | ICD-10-CM | POA: Diagnosis present

## 2022-10-07 DIAGNOSIS — Z953 Presence of xenogenic heart valve: Secondary | ICD-10-CM

## 2022-10-07 LAB — COMPREHENSIVE METABOLIC PANEL
ALT: 56 U/L — ABNORMAL HIGH (ref 0–44)
AST: 46 U/L — ABNORMAL HIGH (ref 15–41)
Albumin: 3.8 g/dL (ref 3.5–5.0)
Alkaline Phosphatase: 58 U/L (ref 38–126)
Anion gap: 6 (ref 5–15)
BUN: 16 mg/dL (ref 8–23)
CO2: 26 mmol/L (ref 22–32)
Calcium: 8.4 mg/dL — ABNORMAL LOW (ref 8.9–10.3)
Chloride: 105 mmol/L (ref 98–111)
Creatinine, Ser: 0.67 mg/dL (ref 0.61–1.24)
GFR, Estimated: >60 mL/min (ref >60)
Glucose, Bld: 262 mg/dL — ABNORMAL HIGH (ref 70–99)
Potassium: 3.8 mmol/L (ref 3.5–5.1)
Sodium: 137 mmol/L (ref 135–145)
Total Bilirubin: 0.7 mg/dL (ref 0.3–1.2)
Total Protein: 7 g/dL (ref 6.5–8.1)

## 2022-10-07 LAB — BPAM RBC: ISSUE DATE / TIME: 202405271303

## 2022-10-07 LAB — CBC WITH DIFFERENTIAL/PLATELET
Abs Immature Granulocytes: 0.08 10*3/uL — ABNORMAL HIGH (ref 0.00–0.07)
Basophils Absolute: 0.1 10*3/uL (ref 0.0–0.1)
Basophils Relative: 1 %
Eosinophils Absolute: 0.5 10*3/uL (ref 0.0–0.5)
Eosinophils Relative: 6 %
HCT: 26.8 % — ABNORMAL LOW (ref 39.0–52.0)
Hemoglobin: 7.3 g/dL — ABNORMAL LOW (ref 13.0–17.0)
Immature Granulocytes: 1 %
Lymphocytes Relative: 23 %
Lymphs Abs: 2 10*3/uL (ref 0.7–4.0)
MCH: 19.6 pg — ABNORMAL LOW (ref 26.0–34.0)
MCHC: 27.2 g/dL — ABNORMAL LOW (ref 30.0–36.0)
MCV: 72 fL — ABNORMAL LOW (ref 80.0–100.0)
Monocytes Absolute: 0.7 10*3/uL (ref 0.1–1.0)
Monocytes Relative: 8 %
Neutro Abs: 5.3 10*3/uL (ref 1.7–7.7)
Neutrophils Relative %: 61 %
Platelets: 135 10*3/uL — ABNORMAL LOW (ref 150–400)
RBC: 3.72 MIL/uL — ABNORMAL LOW (ref 4.22–5.81)
RDW: 17 % — ABNORMAL HIGH (ref 11.5–15.5)
WBC: 8.5 10*3/uL (ref 4.0–10.5)
nRBC: 0.2 % (ref 0.0–0.2)

## 2022-10-07 LAB — TYPE AND SCREEN

## 2022-10-07 LAB — IRON AND TIBC
Iron: 29 ug/dL — ABNORMAL LOW (ref 45–182)
Saturation Ratios: 5 % — ABNORMAL LOW (ref 17.9–39.5)
TIBC: 547 ug/dL — ABNORMAL HIGH (ref 250–450)
UIBC: 518 ug/dL

## 2022-10-07 LAB — FERRITIN: Ferritin: 6 ng/mL — ABNORMAL LOW (ref 24–336)

## 2022-10-07 LAB — RETICULOCYTES
Immature Retic Fract: 30.8 % — ABNORMAL HIGH (ref 2.3–15.9)
RBC.: 3.71 MIL/uL — ABNORMAL LOW (ref 4.22–5.81)
Retic Count, Absolute: 65.7 10*3/uL (ref 19.0–186.0)
Retic Ct Pct: 1.8 % (ref 0.4–3.1)

## 2022-10-07 LAB — FOLATE: Folate: 25 ng/mL (ref >5.9)

## 2022-10-07 LAB — PROTIME-INR
INR: 1.8 — ABNORMAL HIGH (ref 0.8–1.2)
Prothrombin Time: 21.2 seconds — ABNORMAL HIGH (ref 11.4–15.2)

## 2022-10-07 LAB — PREPARE RBC (CROSSMATCH)

## 2022-10-07 LAB — CBG MONITORING, ED
Glucose-Capillary: 246 mg/dL — ABNORMAL HIGH (ref 70–99)
Glucose-Capillary: 200 mg/dL — ABNORMAL HIGH (ref 70–99)

## 2022-10-07 MED ORDER — SODIUM CHLORIDE 0.9 % IV SOLN: 10.0000 mL/h | Freq: Once | INTRAVENOUS | Status: DC

## 2022-10-07 MED ORDER — INSULIN ASPART 100 UNIT/ML IJ SOLN
0.0000 [IU] | INTRAMUSCULAR | Status: DC
  Administered 2022-10-07: 3 [IU] via SUBCUTANEOUS
  Administered 2022-10-07: 2 [IU] via SUBCUTANEOUS
  Administered 2022-10-08: 1 [IU] via SUBCUTANEOUS
  Administered 2022-10-08 (×3): 0 [IU] via SUBCUTANEOUS
  Administered 2022-10-08 (×2): 2 [IU] via SUBCUTANEOUS
  Administered 2022-10-09 (×2): 1 [IU] via SUBCUTANEOUS
  Administered 2022-10-09: 3 [IU] via SUBCUTANEOUS
  Administered 2022-10-09 – 2022-10-10 (×3): 1 [IU] via SUBCUTANEOUS
  Administered 2022-10-10: 2 [IU] via SUBCUTANEOUS
  Filled 2022-10-07 (×12): qty 1

## 2022-10-07 MED ORDER — ALBUTEROL SULFATE (2.5 MG/3ML) 0.083% IN NEBU: 2.5000 mg | INHALATION_SOLUTION | RESPIRATORY_TRACT | Status: DC | PRN

## 2022-10-07 MED ORDER — ONDANSETRON HCL 4 MG/2ML IJ SOLN: 4.0000 mg | Freq: Four times a day (QID) | INTRAMUSCULAR | Status: DC | PRN

## 2022-10-07 MED ORDER — SODIUM CHLORIDE 0.9 % IV SOLN: INTRAVENOUS | Status: AC

## 2022-10-07 MED ORDER — PANTOPRAZOLE 80MG IVPB - SIMPLE MED
80.0000 mg | Freq: Once | INTRAVENOUS | Status: AC
  Administered 2022-10-07: 80 mg via INTRAVENOUS
  Filled 2022-10-07: qty 100

## 2022-10-07 MED ORDER — ONDANSETRON HCL 4 MG PO TABS: 4.0000 mg | ORAL_TABLET | Freq: Four times a day (QID) | ORAL | Status: DC | PRN

## 2022-10-07 MED ORDER — PANTOPRAZOLE INFUSION (NEW) - SIMPLE MED
8.0000 mg/h | INTRAVENOUS | Status: DC
  Administered 2022-10-07: 8 mg/h via INTRAVENOUS
  Filled 2022-10-07: qty 100

## 2022-10-07 NOTE — Consult Note (Addendum)
Midge Minium, MD [facility]  [address] [place], [place] [address] Phone: [phone] Fax : [phone]  Consultation  Referring Provider:     Dr. Arnoldo Morale Primary Care Physician:  [name] Primary Gastroenterologist:  Dr. Norma Fredrickson         Reason for Consultation:     Anemia  Date of Admission:  10/07/2022 Date of Consultation:  10/07/2022         HPI:   Joseph Raymond is a 62 y.o. male who was admitted with melena.  The patient has a history of aortic valve insufficiency with a replacement and chronic anticoagulation therapy.  The patient reports that he has been having black stools for approximately a week.  The patient has a history of having gastric AVMs on an upper endoscopy by Dr. Norma Fredrickson in the past.  The patient denies any abdominal pain.  He also denies any use of any Advil Aleve Motrin BCs or Goody powders.  He states that he follows up with the Coumadin clinic quite often despite coming in today with a subtherapeutic Coumadin level.  The patient also has been short of breath.  There is no report of any nausea vomiting fevers or chills and he also denies any hematemesis.  His last bowel movement of dark material was yesterday.  He has been transfused and states he feels better than when he came into the hospital. The patient's hemoglobin 2 years ago was 12.9 and he came in with a hemoglobin of 7.3 with a MCV of 72.  The patient's INR was 1.8 with a PT of 21.2. The patient has had iron studies including ferritin sent off which are in process and he has folate levels pending.  Past Medical History:  Diagnosis Date   Chronic back pain    Diabetes mellitus without complication (HCC)    Hypertension    Lumbar radiculopathy     Past Surgical History:  Procedure Laterality Date   ESOPHAGOGASTRODUODENOSCOPY N/A 10/01/2019   Procedure: ESOPHAGOGASTRODUODENOSCOPY (EGD);  Surgeon: Toledo, Boykin Nearing, MD;  Location: [facility] ENDOSCOPY;  Service:  Gastroenterology;  Laterality: N/A;   KNEE SURGERY      Prior to Admission medications   Medication Sig Start Date End Date Taking? Authorizing Provider  amoxicillin (AMOXIL) 500 MG capsule Take 1 capsule (500 mg total) by mouth 2 (two) times daily. 07/31/20   Gillis Santa, MD  atorvastatin (LIPITOR) 40 MG tablet Take 40 mg by mouth daily.    [provider]  furosemide (LASIX) 20 MG tablet Take 20 mg by mouth daily.    [provider]  losartan (COZAAR) 50 MG tablet Take 50 mg by mouth daily.    [provider]  metFORMIN (GLUCOPHAGE-XR) 500 MG 24 hr tablet Take 500 mg by mouth daily.    [provider]  pantoprazole (PROTONIX) 40 MG tablet Take 1 tablet (40 mg total) by mouth 2 (two) times daily before a meal. Patient taking differently: Take 40 mg by mouth daily. 10/02/19   Rolly Salter, MD    Family History  Problem Relation Age of Onset   Diabetes Mellitus II Mother      Social History   Tobacco Use   Smoking status: Never   Smokeless tobacco: Never  Substance Use Topics   Alcohol use: Not Currently    Alcohol/week: 2.0 standard drinks of alcohol    Types: 2 Cans of beer per week    Comment: occ  Drug use: No    Allergies as of 10/07/2022   (No Known Allergies)    Review of Systems:    All systems reviewed and negative except where noted in HPI.   Physical Exam:  Vital signs in last 24 hours: Temp:  [98.4 F (36.9 C)-98.6 F (37 C)] 98.6 F (37 C) (05/27 1326) Pulse Rate:  [61-77] 69 (05/27 1326) Resp:  [14-22] 20 (05/27 1326) BP: (90-125)/(48-73) 125/73 (05/27 1326) SpO2:  [95 %-98 %] 96 % (05/27 1326) Weight:  [104.3 kg] 104.3 kg (05/27 0910)   General:   Pleasant, cooperative in NAD Head:  Normocephalic and atraumatic. Eyes:   No icterus.   Conjunctiva pink. PERRLA. Ears:  Normal auditory acuity. Neck:  Supple; no masses or thyroidomegaly Lungs: Respirations even and unlabored. Lungs clear to auscultation  bilaterally.   No wheezes, crackles, or rhonchi.  Heart:  Regular rate and rhythm;  Without murmur, clicks, rubs or gallops Abdomen:  Soft, nondistended, nontender. Normal bowel sounds. No appreciable masses or hepatomegaly.  No rebound or guarding.  Rectal:  Not performed. Msk:  Symmetrical without gross deformities.    Extremities:  Without edema, cyanosis or clubbing. Neurologic:  Alert and oriented x3;  grossly normal neurologically. Skin:  Intact without significant lesions or rashes. Cervical Nodes:  No significant cervical adenopathy. Psych:  Alert and cooperative. Normal affect.  LAB RESULTS: Recent Labs    10/07/22 0917  WBC 8.5  HGB 7.3*  HCT 26.8*  PLT 135*   BMET Recent Labs    10/07/22 0917  NA 137  K 3.8  CL 105  CO2 26  GLUCOSE 262*  BUN 16  CREATININE 0.67  CALCIUM 8.4*   LFT Recent Labs    10/07/22 0917  PROT 7.0  ALBUMIN 3.8  AST 46*  ALT 56*  ALKPHOS 58  BILITOT 0.7   PT/INR Recent Labs    10/07/22 0917  LABPROT 21.2*  INR 1.8*    STUDIES: No results found.    Impression / Plan:   Assessment: Principal Problem:   Upper GI bleed   Joseph Raymond is a 62 y.o. y/o male with melena and anemia which was symptomatic.  The patient is being transfused at the present time.  The patient has a history of AVMs on an upper endoscopy at his last GI bleed and upper endoscopy done by Dr. Norma Fredrickson.  Plan:  The patient will be set up for an upper endoscopy for tomorrow to look for source of his melena and anemia.  PPI IV twice daily  Continue serial CBCs and transfuse PRN Avoid NSAIDs Maintain 2 large-bore IV lines Please page GI with any acute hemodynamic changes, or signs of active GI bleeding  The patient has been explained the plan and agrees with it.  Thank you for involving me in the care of this patient.      LOS: 0 days   Midge Minium, MD, [facility] 10/07/2022, 2:55 PM,  Pager [phone] 7am-5pm  Check AMION for 5pm -7am  coverage and on weekends   Note: This dictation was prepared with Dragon dictation along with smaller phrase technology. Any transcriptional errors that result from this process are unintentional.

## 2022-10-07 NOTE — ED Triage Notes (Signed)
Pt arrived via POV with family reports fatigue and reports black stools when using the bathroom pt states the sxs began 1.5 weeks ago.  Pt has hx of blood transfusion. Denies any pain.  Pt states he takes warfarin for valve replacement.     Triage completed via Spanish Interpreter

## 2022-10-07 NOTE — ED Provider Notes (Signed)
[facility] Provider Note    Event Date/Time   First MD Initiated Contact with Patient 10/07/22 [phone]     (approximate)   History   GI Bleeding  Formal translating service utilized HPI  Joseph Raymond is a 62 y.o. 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  Patient states that over the past 1 week he has noted black dark tarry stools.  Complaining of generalized weakness and fatigue.  Shortness of breath.  Denies any abdominal pain or vomiting of blood.  History of a GI bleed in the past and he is uncertain of his last endoscopy.     Physical Exam   Triage Vital Signs: ED Triage Vitals  Enc Vitals Group     BP 10/07/22 0904 (!) 108/48     Pulse Rate 10/07/22 0904 76     Resp 10/07/22 0904 20     Temp 10/07/22 0904 98.4 F (36.9 C)     Temp Source 10/07/22 0904 Oral     SpO2 10/07/22 0904 97 %     Weight 10/07/22 0910 230 lb (104.3 kg)     Height 10/07/22 0910 5\' 4"  (1.626 m)     Head Circumference --      Peak Flow --      Pain Score 10/07/22 0910 0     Pain Loc --      Pain Edu? --      Excl. in GC? --     Most recent vital signs: Vitals:   10/07/22 1010 10/07/22 1030  BP: (!) 108/56 97/69  Pulse: 61 70  Resp: 19 20  Temp:    SpO2: 95% 98%    Physical Exam Constitutional:      Appearance: He is well-developed.  HENT:     Head: Atraumatic.  Eyes:     Conjunctiva/sclera: Conjunctivae normal.  Cardiovascular:     Rate and Rhythm: Regular rhythm.     Heart sounds: Murmur heard.  Pulmonary:     Effort: No respiratory distress.  Genitourinary:    Comments: DRE with  melena Musculoskeletal:     Cervical back: Normal range of motion.  Skin:    General: Skin is warm.  Neurological:     Mental Status: He is alert. Mental status is at baseline.     IMPRESSION / MDM / ASSESSMENT AND PLAN / ED COURSE  I reviewed the triage vital signs and the nursing notes.  Differential diagnosis including upper GI bleed, lower GI bleed, concern for possible AV malformation given history of gastric AVM.  EKG  I, Corena Herter, the attending physician, personally viewed and interpreted this ECG.   Rate: Normal  Rhythm: Normal sinus  Axis: Normal  Intervals: Read a short PR interval however appears to have a normal PR interval my evaluation.  ST&T Change: None  No tachycardic or bradycardic dysrhythmias while on cardiac telemetry.    LABS (all labs ordered are listed, but only abnormal results are displayed) Labs interpreted as -    Labs Reviewed  COMPREHENSIVE METABOLIC PANEL - Abnormal; Notable for the following components:      Result Value   Glucose, Bld 262 (*)    Calcium 8.4 (*)  AST 46 (*)    ALT 56 (*)    All other components within normal limits  PROTIME-INR - Abnormal; Notable for the following components:   Prothrombin Time 21.2 (*)    INR 1.8 (*)    All other components within normal limits  CBC WITH DIFFERENTIAL/PLATELET - Abnormal; Notable for the following components:   RBC 3.72 (*)    Hemoglobin 7.3 (*)    HCT 26.8 (*)    MCV 72.0 (*)    MCH 19.6 (*)    MCHC 27.2 (*)    RDW 17.0 (*)    Platelets 135 (*)    Abs Immature Granulocytes 0.08 (*)    All other components within normal limits  TYPE AND SCREEN  PREPARE RBC (CROSSMATCH)     MDM  Patient with subtherapeutic INR at 1.8.  Hemoglobin of 7.3 which is a significant drop from his baseline of 12.  Platelets low at 135 but has chronic thrombocytopenia.  Patient does endorse alcohol use but denies history of cirrhosis or varices.  Patient treated with IV Protonix 80 mg.   Typed and crossed and given 1 unit PRBC given his symptomatic anemia, cardiac history and soft blood pressure of 100 systolic.  Discussed risks and benefits with the patient patient has received blood transfusions in the past.  Consulted and discussed with gastroenterology Dr. Servando Snare.  Consulted hospitalist for admission.  Will hold on reversing his Coumadin at this time     PROCEDURES:  Critical Care performed: yes  .Critical Care  Performed by: Corena Herter, MD Authorized by: Corena Herter, MD   Critical care provider statement:    Critical care time (minutes):  30   Critical care time was exclusive of:  Separately billable procedures and treating other patients   Critical care was necessary to treat or prevent imminent or life-threatening deterioration of the following conditions:  Circulatory failure   Critical care was time spent personally by me on the following activities:  Development of treatment plan with patient or surrogate, discussions with consultants, evaluation of patient's response to treatment, examination of patient, ordering and review of laboratory studies, ordering and review of radiographic studies, ordering and performing treatments and interventions, pulse oximetry, re-evaluation of patient's condition and review of old charts   Patient's presentation is most consistent with acute presentation with potential threat to life or bodily function.   MEDICATIONS ORDERED IN ED: Medications  0.9 %  sodium chloride infusion (has no administration in time range)  pantoprazole (PROTONIX) 80 mg /NS 100 mL IVPB (0 mg Intravenous Stopped 10/07/22 1114)    FINAL CLINICAL IMPRESSION(S) / ED DIAGNOSES   Final diagnoses:  Gastrointestinal hemorrhage, unspecified gastrointestinal hemorrhage type  Anemia, unspecified type     Rx / DC Orders   ED Discharge Orders     None        Note:  This document was prepared using Dragon voice recognition software and may  include unintentional dictation errors.   Corena Herter, MD 10/07/22 1134

## 2022-10-07 NOTE — H&P (Signed)
History and Physical    [name] ZOX:[id number] DOB: [birth year] DOA: 10/07/2022  [facility]P: [name]  Patient coming from: home  I have personally briefly reviewed patient's old medical records in [facility]  Chief Complaint: fatigue and black stools   HPI: Joseph Raymond is a 62 y.o. male with medical history significant for aortic valve insufficiency s/p aortic valve and tricuspid valve bioprosthetic replacement, mitral valve regurgitation s/p MVR in setting of h/o endocarditis (on chronic antibiotic therapy),atrial flutter, pulmonary hypertension, hypertension, hyperlipidemia, pulmonary emphysema, DM type II, obesity, GI bleed, 2/2 gastric AVM's s/p bipolar cautery and hx of anemia on chronic anticoagulation with coumadin. Patient presents to ed with over one week of fatigue and black stools. Patient states over the last 4 days he noted progressive weakness and sob and due to persistent of symptoms and prior history of bleeding he presented to ED., Per patient he  notes no n/v/d / abdominal pain / fever/chills / chest pain / presyncope or palpitations. He does however endorse sob/doe.  ED Course:  Afeb, bp 108/48, hr 76, rr 20 sat 97%   Labs  Inr 1.8 Wbc 8.5, hgb 7.3, mcv 72, plt 135,  Na 137, K 3.8, cl 105, bicarb 26, glu 262, cr 0.67, ast 46, alt 56  EKG Nsr,pac  Tx protonix  drip   Dr Servando Snare contacted by ED and will see patient.  Review of Systems: As per HPI otherwise 10 point review of systems negative.   Past Medical History:  Diagnosis Date   Chronic back pain    Diabetes mellitus without complication (HCC)    Hypertension    Lumbar radiculopathy     Past Surgical History:  Procedure Laterality Date   ESOPHAGOGASTRODUODENOSCOPY N/A 10/01/2019   Procedure: ESOPHAGOGASTRODUODENOSCOPY (EGD);  Surgeon: Toledo, Boykin Nearing, MD;  Location: [facility] ENDOSCOPY;  Service: Gastroenterology;  Laterality: N/A;   KNEE SURGERY       reports  that he has never smoked. He has never used smokeless tobacco. He reports that he does not currently use alcohol after a past usage of about 2.0 standard drinks of alcohol per week. He reports that he does not use drugs.  No Known Allergies  Family History  Problem Relation Age of Onset   Diabetes Mellitus II Mother     Prior to Admission medications   Medication Sig Start Date End Date Taking? Authorizing Provider  amoxicillin (AMOXIL) 500 MG capsule Take 1 capsule (500 mg total) by mouth 2 (two) times daily. 07/31/20   Gillis Santa, MD  atorvastatin (LIPITOR) 40 MG tablet Take 40 mg by mouth daily.    [provider]  furosemide (LASIX) 20 MG tablet Take 20 mg by mouth daily.    [provider]  losartan (COZAAR) 50 MG tablet Take 50 mg by mouth daily.    [provider]  metFORMIN (GLUCOPHAGE-XR) 500 MG 24 hr tablet Take 500 mg by mouth daily.    [provider]  pantoprazole (PROTONIX) 40 MG tablet Take 1 tablet (40 mg total) by mouth 2 (two) times daily before a meal. Patient taking differently: Take 40 mg by mouth daily. 10/02/19   Rolly Salter, MD    Physical Exam: Vitals:   10/07/22 0910 10/07/22 0930 10/07/22 1010 10/07/22 1030  BP:  (!) 113/57 (!) 108/56 97/69  Pulse:  77 61 70  Resp:  14 19 20   Temp:      TempSrc:      SpO2:  96% 95% 98%  Weight: 104.3 kg     Height: 5\' 4"  (1.626 m)       Constitutional: NAD, calm, comfortable Vitals:   10/07/22 0910 10/07/22 0930 10/07/22 1010 10/07/22 1030  BP:  (!) 113/57 (!) 108/56 97/69  Pulse:  77 61 70  Resp:  14 19 20   Temp:      TempSrc:      SpO2:  96% 95% 98%  Weight: 104.3 kg     Height: 5\' 4"  (1.626 m)      Eyes: PERRL, lids and conjunctivae normal ENMT: Mucous membranes are moist. Posterior pharynx clear of any exudate or lesions.Normal dentition.  Neck: normal, supple, no masses, no thyromegaly Respiratory: clear to auscultation bilaterally, no wheezing, no crackles.  Normal respiratory effort. No accessory muscle use.  Cardiovascular: Regular rate and rhythm, no murmurs / rubs / gallops. No extremity edema. 2+ pedal pulses. No carotid bruits.  Abdomen: no tenderness, no masses palpated. No hepatosplenomegaly. Bowel sounds positive.  Musculoskeletal: no clubbing / cyanosis. No joint deformity upper and lower extremities. Good ROM, no contractures. Normal muscle tone.  Skin: no rashes, lesions, ulcers. No induration Neurologic: CN 2-12 grossly intact. Sensation intact, l. Strength 5/5 in all 4.  Psychiatric: Normal judgment and insight. Alert and oriented x 3. Normal mood.    Labs on Admission: I have personally reviewed following labs and imaging studies  CBC: Recent Labs  Lab 10/07/22 0917  WBC 8.5  NEUTROABS 5.3  HGB 7.3*  HCT 26.8*  MCV 72.0*  PLT 135*   Basic Metabolic Panel: Recent Labs  Lab 10/07/22 0917  NA 137  K 3.8  CL 105  CO2 26  GLUCOSE 262*  BUN 16  CREATININE 0.67  CALCIUM 8.4*   GFR: Estimated Creatinine Clearance: 101.9 mL/min (by C-G formula based on SCr of 0.67 mg/dL). Liver Function Tests: Recent Labs  Lab 10/07/22 0917  AST 46*  ALT 56*  ALKPHOS 58  BILITOT 0.7  PROT 7.0  ALBUMIN 3.8   No results for input(s): "LIPASE", "AMYLASE" in the last 168 hours. No results for input(s): "AMMONIA" in the last 168 hours. Coagulation Profile: Recent Labs  Lab 10/07/22 0917  INR 1.8*   Cardiac Enzymes: No results for input(s): "CKTOTAL", "CKMB", "CKMBINDEX", "TROPONINI" in the last 168 hours. BNP (last 3 results) No results for input(s): "PROBNP" in the last 8760 hours. HbA1C: No results for input(s): "HGBA1C" in the last 72 hours. CBG: No results for input(s): "GLUCAP" in the last 168 hours. Lipid Profile: No results for input(s): "CHOL", "HDL", "LDLCALC", "TRIG", "CHOLHDL", "LDLDIRECT" in the last 72 hours. Thyroid Function Tests: No results for input(s): "TSH", "T4TOTAL", "FREET4", "T3FREE", "THYROIDAB"  in the last 72 hours. Anemia Panel: No results for input(s): "VITAMINB12", "FOLATE", "FERRITIN", "TIBC", "IRON", "RETICCTPCT" in the last 72 hours. Urine analysis:    Component Value Date/Time   COLORURINE AMBER (A) 08/13/2020 0927   APPEARANCEUR CLOUDY (A) 08/13/2020 0927   LABSPEC 1.018 08/13/2020 0927   PHURINE 5.0 08/13/2020 0927   GLUCOSEU NEGATIVE 08/13/2020 0927   HGBUR MODERATE (A) 08/13/2020 0927   BILIRUBINUR NEGATIVE 08/13/2020 0927   KETONESUR 20 (A) 08/13/2020 0927   PROTEINUR NEGATIVE 08/13/2020 0927   UROBILINOGEN 0.2 08/01/2008 1221   NITRITE NEGATIVE 08/13/2020 0927   LEUKOCYTESUR MODERATE (A) 08/13/2020 0927    Radiological Exams on Admission: No results found.  EKG: Independently reviewed. See above  Assessment/Plan   Acute upper GI bleed  - hx of  gi bleed  due to gastric AVM's s/p bipolar cautery -admit to progressive care  - s/p 1 unit prbc in ED - monitor h/h transfuse if < 8  -ppi drip  - await gi final rec -npo  - hold coumadin  -inr 1.8  consider reversal if further acute bleed occur or h/h drops after prbc  Microcytic Anemia  -due to gi losses -check iron stores  -monitor h/h  Aortic valve insufficiency s/p aortic valve  Tricuspid valve bioprosthetic replacement Mitral valve regurgitation s/p MVR - in setting of h/o endocarditis - cont chronic antibiotic therapy -followed by cardiology  -last visit noted stable valvular disease/SBE   Atrial flutter -stable rate  -resume rate control medication as bp tolerated   Pulmonary hypertension -no active issue    Hypertension -hold bp medications due to lower bp   Hyperlipidemia -resume statin , once pt tolerating po   COPD  -no acute exacerbation   DM type II, -iss/fs /npo  -adjust regimen once patient on on diet   DVT prophylaxis: scd Code Status: full/ as discussed per patient wishes in event of cardiac arrest ) Family Communication:  Disposition Plan: patient  expected to  be admitted greater than 2 midnights  Consults called: GI Dr Servando Snare Admission status: progressive care    Lurline Del MD [facility]   If 7PM-7AM, please contact night-coverage [email or web address] Password [facility]  10/07/2022, 11:29 AM

## 2022-10-07 NOTE — ED Notes (Signed)
In person interpreter requested

## 2022-10-08 ENCOUNTER — Other Ambulatory Visit: Payer: Self-pay

## 2022-10-08 ENCOUNTER — Inpatient Hospital Stay: Payer: Medicare HMO | Admitting: Anesthesiology

## 2022-10-08 ENCOUNTER — Encounter
Admission: EM | Disposition: A | Discharge status: Home / Self Care | Payer: Self-pay | Source: Home / Self Care | Attending: Internal Medicine

## 2022-10-08 DIAGNOSIS — K31811 Angiodysplasia of stomach and duodenum with bleeding: Secondary | ICD-10-CM

## 2022-10-08 DIAGNOSIS — K922 Gastrointestinal hemorrhage, unspecified: Secondary | ICD-10-CM | POA: Diagnosis not present

## 2022-10-08 DIAGNOSIS — K921 Melena: Secondary | ICD-10-CM

## 2022-10-08 HISTORY — PX: ESOPHAGOGASTRODUODENOSCOPY (EGD) WITH PROPOFOL: SHX5813

## 2022-10-08 LAB — TYPE AND SCREEN
ABO/RH(D): O POS
Donor AG Type: NEGATIVE
Unit division: 0
Unit division: 0

## 2022-10-08 LAB — CBC
HCT: 29.9 % — ABNORMAL LOW (ref 39.0–52.0)
Hemoglobin: 8.2 g/dL — ABNORMAL LOW (ref 13.0–17.0)
MCH: 20.7 pg — ABNORMAL LOW (ref 26.0–34.0)
MCHC: 27.4 g/dL — ABNORMAL LOW (ref 30.0–36.0)
MCV: 75.3 fL — ABNORMAL LOW (ref 80.0–100.0)
Platelets: 119 10*3/uL — ABNORMAL LOW (ref 150–400)
RBC: 3.97 MIL/uL — ABNORMAL LOW (ref 4.22–5.81)
RDW: 18.7 % — ABNORMAL HIGH (ref 11.5–15.5)
WBC: 7.2 10*3/uL (ref 4.0–10.5)
nRBC: 0 % (ref 0.0–0.2)

## 2022-10-08 LAB — COMPREHENSIVE METABOLIC PANEL
ALT: 57 U/L — ABNORMAL HIGH (ref 0–44)
AST: 57 U/L — ABNORMAL HIGH (ref 15–41)
Albumin: 3.4 g/dL — ABNORMAL LOW (ref 3.5–5.0)
Alkaline Phosphatase: 51 U/L (ref 38–126)
Anion gap: 7 (ref 5–15)
BUN: 15 mg/dL (ref 8–23)
CO2: 25 mmol/L (ref 22–32)
Calcium: 8.2 mg/dL — ABNORMAL LOW (ref 8.9–10.3)
Chloride: 106 mmol/L (ref 98–111)
Creatinine, Ser: 0.58 mg/dL — ABNORMAL LOW (ref 0.61–1.24)
GFR, Estimated: >60 mL/min (ref >60)
Glucose, Bld: 160 mg/dL — ABNORMAL HIGH (ref 70–99)
Potassium: 3.8 mmol/L (ref 3.5–5.1)
Sodium: 138 mmol/L (ref 135–145)
Total Bilirubin: 0.8 mg/dL (ref 0.3–1.2)
Total Protein: 6.3 g/dL — ABNORMAL LOW (ref 6.5–8.1)

## 2022-10-08 LAB — HEMOGLOBIN A1C
Hgb A1c MFr Bld: 9.2 % — ABNORMAL HIGH (ref 4.8–5.6)
Mean Plasma Glucose: 217 mg/dL

## 2022-10-08 LAB — GLUCOSE, CAPILLARY
Glucose-Capillary: 139 mg/dL — ABNORMAL HIGH (ref 70–99)
Glucose-Capillary: 102 mg/dL — ABNORMAL HIGH (ref 70–99)
Glucose-Capillary: 179 mg/dL — ABNORMAL HIGH (ref 70–99)
Glucose-Capillary: 142 mg/dL — ABNORMAL HIGH (ref 70–99)

## 2022-10-08 LAB — CBG MONITORING, ED: Glucose-Capillary: 182 mg/dL — ABNORMAL HIGH (ref 70–99)

## 2022-10-08 LAB — PHOSPHORUS: Phosphorus: 2.9 mg/dL (ref 2.5–4.6)

## 2022-10-08 LAB — HIV ANTIBODY (ROUTINE TESTING W REFLEX): HIV Screen 4th Generation wRfx: NONREACTIVE

## 2022-10-08 LAB — MAGNESIUM: Magnesium: 2.2 mg/dL (ref 1.7–2.4)

## 2022-10-08 LAB — VITAMIN B12: Vitamin B-12: 281 pg/mL (ref 180–914)

## 2022-10-08 SURGERY — ESOPHAGOGASTRODUODENOSCOPY (EGD) WITH PROPOFOL
Anesthesia: General

## 2022-10-08 MED ORDER — SENNOSIDES-DOCUSATE SODIUM 8.6-50 MG PO TABS: 1.0000 | ORAL_TABLET | Freq: Every evening | ORAL | Status: DC | PRN

## 2022-10-08 MED ORDER — GUAIFENESIN 100 MG/5ML PO LIQD: 5.0000 mL | ORAL | Status: DC | PRN

## 2022-10-08 MED ORDER — ACETAMINOPHEN 325 MG PO TABS: 650.0000 mg | ORAL_TABLET | Freq: Four times a day (QID) | ORAL | Status: DC | PRN

## 2022-10-08 MED ORDER — SODIUM CHLORIDE 0.9 % IV SOLN
200.0000 mg | INTRAVENOUS | Status: DC
  Administered 2022-10-08 – 2022-10-09 (×2): 200 mg via INTRAVENOUS
  Filled 2022-10-08: qty 10
  Filled 2022-10-08 (×2): qty 200

## 2022-10-08 MED ORDER — TRAZODONE HCL 50 MG PO TABS: 50.0000 mg | ORAL_TABLET | Freq: Every evening | ORAL | Status: DC | PRN

## 2022-10-08 MED ORDER — SODIUM CHLORIDE 0.9 % IV SOLN: INTRAVENOUS | Status: DC

## 2022-10-08 MED ORDER — ONDANSETRON HCL 4 MG/2ML IJ SOLN: 4.0000 mg | Freq: Four times a day (QID) | INTRAMUSCULAR | Status: DC | PRN

## 2022-10-08 MED ORDER — METOPROLOL TARTRATE 5 MG/5ML IV SOLN: 5.0000 mg | INTRAVENOUS | Status: DC | PRN

## 2022-10-08 MED ORDER — LIDOCAINE HCL (CARDIAC) PF 100 MG/5ML IV SOSY
PREFILLED_SYRINGE | INTRAVENOUS | Status: DC | PRN
  Administered 2022-10-08: 80 mg via INTRAVENOUS

## 2022-10-08 MED ORDER — HYDRALAZINE HCL 20 MG/ML IJ SOLN: 10.0000 mg | INTRAMUSCULAR | Status: DC | PRN

## 2022-10-08 MED ORDER — PANTOPRAZOLE SODIUM 40 MG IV SOLR
40.0000 mg | Freq: Two times a day (BID) | INTRAVENOUS | Status: DC
  Administered 2022-10-08 – 2022-10-10 (×5): 40 mg via INTRAVENOUS
  Filled 2022-10-08 (×5): qty 10

## 2022-10-08 MED ORDER — IPRATROPIUM-ALBUTEROL 0.5-2.5 (3) MG/3ML IN SOLN: 3.0000 mL | RESPIRATORY_TRACT | Status: DC | PRN

## 2022-10-08 MED ORDER — PROPOFOL 10 MG/ML IV BOLUS
INTRAVENOUS | Status: DC | PRN
  Administered 2022-10-08: 70 mg via INTRAVENOUS
  Administered 2022-10-08 (×3): 30 mg via INTRAVENOUS
  Administered 2022-10-08: 20 mg via INTRAVENOUS
  Administered 2022-10-08: 30 mg via INTRAVENOUS

## 2022-10-08 NOTE — ED Notes (Signed)
Pt sleeping at this time. I observed equal chest rise and fall with soft sonorous respirations. Pt lying on side with BP cuff. Will complete assessment and vital signs upon pt waking.

## 2022-10-08 NOTE — Consult Note (Signed)
PHARMACY CONSULT NOTE - FOLLOW UP  Pharmacy Consult for Electrolyte Monitoring and Replacement   Recent Labs: Potassium (mmol/L)  Date Value  10/08/2022 3.8   Magnesium (mg/dL)  Date Value  16/02/9603 2.1   Calcium (mg/dL)  Date Value  54/01/8118 8.2 (L)   Albumin (g/dL)  Date Value  14/78/2956 3.4 (L)   Sodium (mmol/L)  Date Value  10/08/2022 138   Assessment: 62 y.o. 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 Pt received pRBC x 1 on 5/27 and is scheduled to undergo upper endoscopy on 5/28.  Goal of Therapy:  K >/= 4.0 and Mg >/= 2.0  Plan:  Patient is NPO K 3.8 >> Deferring replacement until after endoscopy Magnesium level ordered, will replace as necessary No other electrolyte replacement warranted at this time Will follow-up electrolytes again with AM labs  Will M. Dareen Piano, PharmD PGY-1 Pharmacy Resident 10/08/2022 9:28 AM

## 2022-10-08 NOTE — Op Note (Signed)
[facility] Gastroenterology Patient Name: Joseph Raymond Procedure Date: 10/08/2022 11:15 AM MRN: [id number] Account #: [id number] Date of Birth: 1960-10-18 Admit Type: Inpatient Age: 62 Room: [facility] ENDO ROOM 4 Gender: Male Note Status: Finalized Instrument Name: Upper Endoscope [id number] Procedure:             Upper GI endoscopy Indications:           Melena Providers:             Midge Minium MD, MD Referring MD:          No Local Md, MD (Referring MD) Medicines:             Propofol per Anesthesia Complications:         No immediate complications. Procedure:             Pre-Anesthesia Assessment:                        - Prior to the procedure, a History and Physical was                         performed, and patient medications and allergies were                         reviewed. The patient's tolerance of previous                         anesthesia was also reviewed. The risks and benefits                         of the procedure and the sedation options and risks                         were discussed with the patient. All questions were                         answered, and informed consent was obtained. Prior                         Anticoagulants: The patient has taken Coumadin                         (warfarin), last dose was 2 days prior to procedure.                         ASA Grade Assessment: III - A patient with severe                         systemic disease. After reviewing the risks and                         benefits, the patient was deemed in satisfactory                         condition to undergo the procedure.                        After obtaining informed consent, the endoscope was  passed under direct vision. Throughout the procedure,                         the patient's blood pressure, pulse, and oxygen                         saturations were monitored continuously. The Endoscope                         was  introduced through the mouth, and advanced to the                         second part of duodenum. The upper GI endoscopy was                         accomplished without difficulty. The patient tolerated                         the procedure well. Findings:      The examined esophagus was normal.      Multiple angiodysplastic lesions with bleeding were found in the gastric       antrum. Purastat placed on bleeding sites Coagulation for hemostasis       using argon plasma at 2 liters/minute and 20 watts was successful.      The examined duodenum was normal. Impression:            - Normal esophagus.                        - Multiple bleeding angiodysplastic lesions in the                         stomach. Treated with argon plasma coagulation (APC).                        - Normal examined duodenum.                        - No specimens collected. Recommendation:        - Return patient to hospital ward for ongoing care.                        - Clear liquid diet.                        - Continue present medications. Procedure Code(s):     --- Professional ---                        [phone], Esophagogastroduodenoscopy, flexible,                         transoral; with control of bleeding, any method Diagnosis Code(s):     --- Professional ---                        K92.1, Melena (includes Hematochezia)                        K31.811, Angiodysplasia of stomach and duodenum with  bleeding CPT copyright 2022 American [employer] Association. All rights reserved. The codes documented in this report are preliminary and upon coder review may  be revised to meet current compliance requirements. Midge Minium MD, MD 10/08/2022 11:40:57 AM This report has been signed electronically. Number of Addenda: 0 Note Initiated On: 10/08/2022 11:15 AM Estimated Blood Loss:  Estimated blood loss: none.      [facility]

## 2022-10-08 NOTE — Progress Notes (Signed)
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  Hemoglobin 7.3 upon admission, started on PPI drip, GI consulted.   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  Baseline hemoglobin 12, admission hemoglobin less than 8, 1 unit PRBC transfusion.  Seen by GI had multiple bleeding angiodysplastic lesion requiring APC.  Will also give IV iron due to low saturations and ferritin -Iron studies   Aortic valve insufficiency s/p aortic valve  2018 Tricuspid valve bioprosthetic replacement Mitral valve regurgitation s/p MVR -Has history of endocarditis on chronic antibiotic treatment.   consulted [facility] cardiology   Atrial flutter -Current appears to be stable.  Coumadin on hold   Pulmonary hypertension -no active issue      Hypertension -hold bp medications due to lower bp.  IV as needed    Hyperlipidemia -resume statin , once pt tolerating po    COPD  -no acute exacerbation    DM type II -Sliding scale and Accu-Cheks  DVT prophylaxis: SCDs Start: 10/07/22 1205 Code Status: Full Family Communication:   Status is: Inpatient On going eval for GI Bleed.  Cardiology consulted       Diet Orders (From admission, onward)     Start     Ordered   10/07/22 2323  Diet  NPO time specified  Diet effective now        10/07/22 2322            Subjective:  Seen in endoscopy suite, no complaints  Examination:  General exam: Appears calm and comfortable  Respiratory system: Clear to auscultation. Respiratory effort normal. Cardiovascular system: S1 & S2 heard, RRR. No JVD, murmurs, rubs, gallops or clicks. No pedal edema. Gastrointestinal system: Abdomen is nondistended, soft and nontender. No organomegaly or masses felt. Normal bowel sounds heard. Central nervous system: Alert and oriented. No focal neurological deficits. Extremities: Symmetric 5 x 5 power. Skin: No rashes, lesions or ulcers Psychiatry: Judgement and insight appear normal. Mood & affect appropriate.  Objective: Vitals:   10/08/22 1142 10/08/22 1152 10/08/22 1202 10/08/22 1212  BP: 117/68 (!) 118/91 125/75 117/74  Pulse:      Resp:      Temp: (!) 97 F (36.1 C)     TempSrc: Temporal     SpO2:      Weight:      Height:        Intake/Output Summary (Last 24 hours) at 10/08/2022 1222 Last data filed at 10/08/2022 1139 Gross per 24 hour  Intake 1096.59 ml  Output 0 ml  Net 1096.59 ml   Filed Weights   10/07/22 0910  Weight: 104.3 kg    Scheduled Meds:  insulin aspart  0-9 Units Subcutaneous Q4H   pantoprazole (PROTONIX) IV  40 mg Intravenous Q12H   Continuous Infusions:  sodium chloride Stopped (10/07/22 1318)   iron sucrose Stopped (  10/08/22 1002)    Nutritional status     Body mass index is 39.48 kg/m.  Data Reviewed:   CBC: Recent Labs  Lab 10/07/22 0917 10/08/22 0530  WBC 8.5 7.2  NEUTROABS 5.3  --   HGB 7.3* 8.2*  HCT 26.8* 29.9*  MCV 72.0* 75.3*  PLT 135* 119*   Basic Metabolic Panel: Recent Labs  Lab 10/07/22 0917 10/08/22 0530  NA 137 138  K 3.8 3.8  CL 105 106  CO2 26 25  GLUCOSE 262* 160*  BUN 16 15  CREATININE 0.67 0.58*  CALCIUM 8.4* 8.2*  MG  --  2.2  PHOS  --  2.9   GFR: Estimated Creatinine Clearance: 101.9 mL/min (A)  (by C-G formula based on SCr of 0.58 mg/dL (L)). Liver Function Tests: Recent Labs  Lab 10/07/22 0917 10/08/22 0530  AST 46* 57*  ALT 56* 57*  ALKPHOS 58 51  BILITOT 0.7 0.8  PROT 7.0 6.3*  ALBUMIN 3.8 3.4*   No results for input(s): "LIPASE", "AMYLASE" in the last 168 hours. No results for input(s): "AMMONIA" in the last 168 hours. Coagulation Profile: Recent Labs  Lab 10/07/22 0917  INR 1.8*   Cardiac Enzymes: No results for input(s): "CKTOTAL", "CKMB", "CKMBINDEX", "TROPONINI" in the last 168 hours. BNP (last 3 results) No results for input(s): "PROBNP" in the last 8760 hours. HbA1C: No results for input(s): "HGBA1C" in the last 72 hours. CBG: Recent Labs  Lab 10/07/22 1236 10/07/22 1548 10/08/22 0852  GLUCAP 246* 200* 182*   Lipid Profile: No results for input(s): "CHOL", "HDL", "LDLCALC", "TRIG", "CHOLHDL", "LDLDIRECT" in the last 72 hours. Thyroid Function Tests: No results for input(s): "TSH", "T4TOTAL", "FREET4", "T3FREE", "THYROIDAB" in the last 72 hours. Anemia Panel: Recent Labs    10/07/22 0916 10/07/22 1448  FOLATE 25.0  --   FERRITIN 6*  --   TIBC 547*  --   IRON 29*  --   RETICCTPCT  --  1.8   Sepsis Labs: No results for input(s): "PROCALCITON", "LATICACIDVEN" in the last 168 hours.  No results found for this or any previous visit (from the past 240 hour(s)).       Radiology Studies: No results found.         LOS: 1 day   Time spent= 35 mins    Nicolai Labonte Joline Maxcy, MD [facility]  If 7PM-7AM, please contact night-coverage  10/08/2022, 12:22 PM

## 2022-10-08 NOTE — Anesthesia Preprocedure Evaluation (Signed)
Anesthesia Evaluation  Patient identified by MRN, date of birth, ID band Patient awake    Reviewed: Allergy & Precautions, NPO status , Patient's Chart, lab work & pertinent test results  History of Anesthesia Complications Negative for: history of anesthetic complications  Airway Mallampati: III  TM Distance: <3 FB Neck ROM: full    Dental  (+) Chipped, Poor Dentition, Missing   Pulmonary neg pulmonary ROS, neg shortness of breath   Pulmonary exam normal        Cardiovascular Exercise Tolerance: Good hypertension, (-) angina Normal cardiovascular exam     Neuro/Psych  Neuromuscular disease  negative psych ROS   GI/Hepatic negative GI ROS, Neg liver ROS,,,  Endo/Other  diabetes, Type 2    Renal/GU Renal disease  negative genitourinary   Musculoskeletal   Abdominal   Peds  Hematology negative hematology ROS (+)   Anesthesia Other Findings Past Medical History: No date: Chronic back pain No date: Diabetes mellitus without complication (HCC) No date: Hypertension No date: Lumbar radiculopathy  Past Surgical History: 10/01/2019: ESOPHAGOGASTRODUODENOSCOPY; N/A     Comment:  Procedure: ESOPHAGOGASTRODUODENOSCOPY (EGD);  Surgeon:               Toledo, Boykin Nearing, MD;  Location: [facility] ENDOSCOPY;                Service: Gastroenterology;  Laterality: N/A; No date: KNEE SURGERY  BMI    Body Mass Index: 39.48 kg/m      Reproductive/Obstetrics negative OB ROS                             Anesthesia Physical Anesthesia Plan  ASA: 3  Anesthesia Plan: General   Post-op Pain Management:    Induction: Intravenous  PONV Risk Score and Plan: Propofol infusion and TIVA  Airway Management Planned: Natural Airway and Nasal Cannula  Additional Equipment:   Intra-op Plan:   Post-operative Plan:   Informed Consent: I have reviewed the patients History and Physical, chart, labs and  discussed the procedure including the risks, benefits and alternatives for the proposed anesthesia with the patient or authorized representative who has indicated his/her understanding and acceptance.     Dental Advisory Given and Interpreter used for interveiw  Plan Discussed with: Anesthesiologist, CRNA and Surgeon  Anesthesia Plan Comments: (Patient consented for risks of anesthesia including but not limited to:  - adverse reactions to medications - risk of airway placement if required - damage to eyes, teeth, lips or other oral mucosa - nerve damage due to positioning  - sore throat or hoarseness - Damage to heart, brain, nerves, lungs, other parts of body or loss of life  Patient voiced understanding.)       Anesthesia Quick Evaluation

## 2022-10-08 NOTE — Progress Notes (Signed)
Patient welcomed and oriented to room/unit using stratus interpretor. Patient's daughter at bedside as well who is [place] and spanish speaking. All questions answered using stratus interpretor.

## 2022-10-08 NOTE — OR Nursing (Signed)
PT S/P EGD WITH USE OF PURASTAT GEL FOR BLEEDING. RN SPOKE TO PRIMARY RN Lowanda Foster. PT TO REMAIN NPO UNTIL 4PM THEN CLEAR LIQUIDS

## 2022-10-08 NOTE — Consult Note (Signed)
PHARMACY CONSULT NOTE - FOLLOW UP  Pharmacy Consult for Electrolyte Monitoring and Replacement   Recent Labs: Potassium (mmol/L)  Date Value  10/08/2022 3.8   Magnesium (mg/dL)  Date Value  60/45/4098 2.2   Calcium (mg/dL)  Date Value  11/91/4782 8.2 (L)   Albumin (g/dL)  Date Value  95/62/1308 3.4 (L)   Phosphorus (mg/dL)  Date Value  65/78/4696 2.9   Sodium (mmol/L)  Date Value  10/08/2022 138   Assessment: 62 y.o. 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 Pt received pRBC x 1 on 5/27 and is scheduled to undergo upper endoscopy on 5/28.  Goal of Therapy:  K >/= 4.0 and Mg >/= 2.0  Plan:  Patient is NPO No electrolyte replacement warranted at this time Will follow-up electrolytes again with AM labs  Joseph Raymond PharmD, BCPS 10/08/2022 12:11 PM

## 2022-10-08 NOTE — ED Notes (Signed)
Pt axox4, without complaints at this time. Pt aware of scheduled EGD. Pt informed of time (11am), per call from endoscopy nurse. Pt has no further questions or needs at this time.

## 2022-10-08 NOTE — Consult Note (Addendum)
[facility] CLINIC CARDIOLOGY CONSULT NOTE       Patient ID: Joseph Raymond MRN: [id number] DOB/AGE: 64-13-1962 64 y.o.  Admit date: 10/07/2022 Referring Physician Dr. Stephania Fragmin Primary Physician [name] Primary Cardiologist Dr. 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  Hemoglobin on admission down to 7.3.  He underwent upper endoscopy with GI 10/08/2022 which revealed multiple angiodysplastic lesions in the stomach which were cauterized.  Cardiology is consulted for medication management of his anticoagulant.  History is obtained from the patient with assistance from video Spanish interpreter.  He presented to [facility] with 1 week of worsening fatigue and black stools, hemoglobin on admission was down to 7.3 compared to a baseline of 12 from 1 year ago.  He underwent upper endoscopy earlier today which revealed multiple angiodysplastic lesions in the stomach which were cauterized.  Of note, the patient had a similar presentation in 2020 with upper GI bleeding from AVMs and his warfarin was held for what appears to be at least a year.  The patient was taking warfarin for stroke prevention prior to this current hospitalization.  At my time of evaluation he is laying nearly flat in bed, feeling generally fatigued which he attributes to not having anything to eat or drink all day.  He denies chest pain, shortness of breath, heart racing or palpitations.    Recent vitals are notable for a blood pressure of 129/77, heart rate in the 60s in sinus rhythm with occasional premature atrial contractions on telemetry.  Comfortable on room air.  Labs notable for H&H  today 8.2/29.9, microcytic anemia with MCV low at 75.3, slight thrombocytopenia with platelets 119. INR was subtherapeutic on admission at 1.8.   Review of systems complete and found to be negative unless listed above     Past Medical History:  Diagnosis Date   Chronic back pain    Diabetes mellitus without complication (HCC)    Hypertension    Lumbar radiculopathy     Past Surgical History:  Procedure Laterality Date   ESOPHAGOGASTRODUODENOSCOPY N/A 10/01/2019   Procedure: ESOPHAGOGASTRODUODENOSCOPY (EGD);  Surgeon: Toledo, Boykin Nearing, MD;  Location: [facility] ENDOSCOPY;  Service: Gastroenterology;  Laterality: N/A;   KNEE SURGERY      Medications Prior to Admission  Medication Sig Dispense Refill Last Dose   amoxicillin (AMOXIL) 500 MG capsule Take 1 capsule (500 mg total) by mouth 2 (two) times daily.   10/06/2022 at 2000   losartan (COZAAR) 25 MG tablet Take 25 mg by mouth daily.   10/06/2022 at 0800   pantoprazole (PROTONIX) 40 MG tablet Take 1 tablet (40 mg total) by mouth 2 (two) times daily before a meal. (Patient taking differently: Take 40 mg by mouth daily.) 60 tablet 0 10/06/2022 at 0800   terbinafine (LAMISIL) 250 MG tablet Take 250 mg by mouth daily.   Past Week at Unknown   warfarin (COUMADIN) 2 MG tablet Take 2 mg by mouth in the morning. (Take with 4mg  tablet to equal 6mg  total)   10/06/2022 at 0800   warfarin (COUMADIN) 4 MG tablet Take 4 mg by mouth in the morning. (Take with 2mg  tablet to equal 6mg  total)   10/06/2022 at 0800  Social History   Socioeconomic History   Marital status: Single    Spouse name: Not on file   Number of children: Not on file   Years of education: Not on file   Highest education level: Not on file  Occupational History   Not on file  Tobacco Use   Smoking status: Never   Smokeless tobacco: Never  Substance and Sexual Activity   Alcohol use: Not Currently    Alcohol/week: 2.0 standard drinks of alcohol    Types: 2 Cans of beer per week     Comment: occ   Drug use: No   Sexual activity: Not on file  Other Topics Concern   Not on file  Social History Narrative   Not on file   Social Determinants of Health   Financial Resource Strain: Not on file  Food Insecurity: No Food Insecurity (10/07/2022)   Hunger Vital Sign    Worried About Running Out of Food in the Last Year: Never true    Ran Out of Food in the Last Year: Never true  Transportation Needs: No Transportation Needs (10/07/2022)   PRAPARE - Administrator, Civil Service (Medical): No    Lack of Transportation (Non-Medical): No  Physical Activity: Not on file  Stress: Not on file  Social Connections: Not on file  Intimate Partner Violence: Not At Risk (10/07/2022)   Humiliation, Afraid, Rape, and Kick questionnaire    Fear of Current or Ex-Partner: No    Emotionally Abused: No    Physically Abused: No    Sexually Abused: No    Family History  Problem Relation Age of Onset   Diabetes Mellitus II Mother       Intake/Output Summary (Last 24 hours) at 10/08/2022 1310 Last data filed at 10/08/2022 1139 Gross per 24 hour  Intake 1096.59 ml  Output 0 ml  Net 1096.59 ml    Vitals:   10/08/22 1202 10/08/22 1212 10/08/22 1241 10/08/22 1256  BP: 125/75 117/74 129/77   Pulse:   (!) 57   Resp:   18   Temp:   98.4 F (36.9 C)   TempSrc:   Oral   SpO2:   98%   Weight:    104.7 kg  Height:    5\' 4"  (1.626 m)    PHYSICAL EXAM General: pleasant middle aged male , well nourished, in no acute distress. Laying flat in bed  HEENT:  Normocephalic and atraumatic. Neck:  No JVD.  Lungs: Normal respiratory effort on room air. Clear bilaterally to auscultation. No wheezes, crackles, rhonchi.  Heart: HRRR . Normal S1 and S2. 3/6 systolic murmur heard throughout Abdomen: Non-distended appearing with excess adiposity.  Msk: Normal strength and tone for age. Extremities: Warm and well perfused. No clubbing, cyanosis. Trace bilateral LE edema.  Neuro: Alert  and oriented X 3. Psych:  Answers questions appropriately.   Labs: Basic Metabolic Panel: Recent Labs    10/07/22 0917 10/08/22 0530  NA 137 138  K 3.8 3.8  CL 105 106  CO2 26 25  GLUCOSE 262* 160*  BUN 16 15  CREATININE 0.67 0.58*  CALCIUM 8.4* 8.2*  MG  --  2.2  PHOS  --  2.9   Liver Function Tests: Recent Labs    10/07/22 0917 10/08/22 0530  AST 46* 57*  ALT 56* 57*  ALKPHOS 58 51  BILITOT 0.7 0.8  PROT 7.0 6.3*  ALBUMIN 3.8 3.4*   No results for input(s): "LIPASE", "AMYLASE" in  the last 72 hours. CBC: Recent Labs    10/07/22 0917 10/08/22 0530  WBC 8.5 7.2  NEUTROABS 5.3  --   HGB 7.3* 8.2*  HCT 26.8* 29.9*  MCV 72.0* 75.3*  PLT 135* 119*   Cardiac Enzymes: No results for input(s): "CKTOTAL", "CKMB", "CKMBINDEX", "TROPONINIHS" in the last 72 hours. BNP: No results for input(s): "BNP" in the last 72 hours. D-Dimer: No results for input(s): "DDIMER" in the last 72 hours. Hemoglobin A1C: No results for input(s): "HGBA1C" in the last 72 hours. Fasting Lipid Panel: No results for input(s): "CHOL", "HDL", "LDLCALC", "TRIG", "CHOLHDL", "LDLDIRECT" in the last 72 hours. Thyroid Function Tests: No results for input(s): "TSH", "T4TOTAL", "T3FREE", "THYROIDAB" in the last 72 hours.  Invalid input(s): "FREET3" Anemia Panel: Recent Labs    10/07/22 0916 10/07/22 1448  FOLATE 25.0  --   FERRITIN 6*  --   TIBC 547*  --   IRON 29*  --   RETICCTPCT  --  1.8     Radiology: No results found.  ECHO 08/2022 NORMAL LEFT VENTRICULAR SYSTOLIC FUNCTION   WITH MODERATE LVH  NORMAL RIGHT VENTRICULAR SYSTOLIC FUNCTION  MILD VALVULAR REGURGITATION (See above)  NO VALVULAR STENOSIS  ESTIMATED LVEF >55%  CALCULATED: 58.7%  GLS: -15.8%  AoV: BIOPROSTHETIC VALVE APPEARS WELL-SUITED AND FULLY FUNCTIONAL  AORTIC GRADIENTS: 3.52m/s; AVA: 1.4cm^2  (prev. echo: 3.24m/s: AVA: 1.5cm^2)  MV: BIOPROSTHETIC VALVE  MILD-MODERATE MR  MODERATE LAE   ____________________________________________________________ Electronically signed by    Dorothyann Peng, MD on 08/29/2022 01: 06 PM           Performed By: Verdis Prime     Ordering Physician: Dorothyann Peng, MD   Steffanie Dunn - 08/28/22 Impression   Normal myocardial perfusion scan no evidence of stress-induced myocardial ischemia ejection fraction is 60% conclusion negative scan this is a lower study  [facility]  Cardiac Catheterization Laboratory [facility] [place] [place] Tel: [phone] Fax: [phone]  FINAL CARDIAC CATHETERIZATION REPORT  Date of Procedure: 11/29/2016 ___________________________________________________________________________ _ Findings: 1. No significant coronary artery disease  Recommendations: 1. Per primary team. 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  Hemoglobin on admission down to 7.3.  He underwent upper endoscopy with GI 10/08/2022 which revealed multiple angiodysplastic lesions in the stomach which were cauterized.  Cardiology is consulted for medication management of his anticoagulant.  # acute blood loss anemia  # UGIB Status post upper endoscopy 5/28 with multiple angiodysplastic lesions cauterized -Further management per primary/GI  #  paroxysmal atrial flutter s/p ablation (2018)  In sinus rhythm with occasional premature atrial contractions on telemetry, rate controlled in the 60s predominantly.  In the setting of active GI bleeding, anticoagulation with warfarin is contraindicated.  Of note, the patient has had GI bleeding historically in 2020 for  which warfarin was held for a period of time.  Unclear when or why it was restarted, but would recommend continuing to hold warfarin through discharge until outpatient follow-up with cardiology and GI. -Would also recommend referral to electrophysiology on an outpatient basis for consideration of Watchman device. CHADSVASC 2.   # hx enterococcus endocarditis # s/p bioprosthetic MVR and AVR + TV repair (12/04/16) Euvolemic on exam without active heart failure symptoms.  Recent echo 08/2022 showing well-seated MVR and AVR.  - continue losartan   Cardiology will sign off. Please haiku with questions or re-engage if needed.    This patient's plan of care was discussed and created with Dr. Darrold Junker and he is in agreement.  Signed: Rebeca Allegra , PA-C 10/08/2022, 1:10 PM [facility]

## 2022-10-08 NOTE — Transfer of Care (Signed)
Immediate Anesthesia Transfer of Care Note  Patient: Joseph Raymond  Procedure(s) Performed: ESOPHAGOGASTRODUODENOSCOPY (EGD) WITH PROPOFOL  Patient Location: PACU and Endoscopy Unit  Anesthesia Type:General  Level of Consciousness: awake, drowsy, and patient cooperative  Airway & Oxygen Therapy: Patient Spontanous Breathing  Post-op Assessment: Report given to RN and Post -op Vital signs reviewed and stable  Post vital signs: Reviewed and stable  Last Vitals:  Vitals Value Taken Time  BP 117/68 10/08/22 1144  Temp 36.1 C 10/08/22 1142  Pulse 71 10/08/22 1144  Resp 18 10/08/22 1144  SpO2 95 % 10/08/22 1144  Vitals shown include unvalidated device data.  Last Pain:  Vitals:   10/08/22 1142  TempSrc: Temporal  PainSc:          Complications: No notable events documented.

## 2022-10-09 ENCOUNTER — Encounter: Payer: Self-pay | Admitting: Gastroenterology

## 2022-10-09 DIAGNOSIS — K922 Gastrointestinal hemorrhage, unspecified: Secondary | ICD-10-CM | POA: Diagnosis not present

## 2022-10-09 LAB — BASIC METABOLIC PANEL
Anion gap: 7 (ref 5–15)
BUN: 10 mg/dL (ref 8–23)
CO2: 27 mmol/L (ref 22–32)
Calcium: 8.4 mg/dL — ABNORMAL LOW (ref 8.9–10.3)
Chloride: 102 mmol/L (ref 98–111)
Creatinine, Ser: 0.61 mg/dL (ref 0.61–1.24)
GFR, Estimated: >60 mL/min (ref >60)
Glucose, Bld: 156 mg/dL — ABNORMAL HIGH (ref 70–99)
Potassium: 3.6 mmol/L (ref 3.5–5.1)
Sodium: 136 mmol/L (ref 135–145)

## 2022-10-09 LAB — CBC
HCT: 28 % — ABNORMAL LOW (ref 39.0–52.0)
Hemoglobin: 7.9 g/dL — ABNORMAL LOW (ref 13.0–17.0)
MCH: 20.7 pg — ABNORMAL LOW (ref 26.0–34.0)
MCHC: 28.2 g/dL — ABNORMAL LOW (ref 30.0–36.0)
MCV: 73.5 fL — ABNORMAL LOW (ref 80.0–100.0)
Platelets: 119 10*3/uL — ABNORMAL LOW (ref 150–400)
RBC: 3.81 MIL/uL — ABNORMAL LOW (ref 4.22–5.81)
RDW: 19.1 % — ABNORMAL HIGH (ref 11.5–15.5)
WBC: 7.4 10*3/uL (ref 4.0–10.5)
nRBC: 0.4 % — ABNORMAL HIGH (ref 0.0–0.2)

## 2022-10-09 LAB — GLUCOSE, CAPILLARY
Glucose-Capillary: 115 mg/dL — ABNORMAL HIGH (ref 70–99)
Glucose-Capillary: 136 mg/dL — ABNORMAL HIGH (ref 70–99)
Glucose-Capillary: 142 mg/dL — ABNORMAL HIGH (ref 70–99)
Glucose-Capillary: 146 mg/dL — ABNORMAL HIGH (ref 70–99)
Glucose-Capillary: 165 mg/dL — ABNORMAL HIGH (ref 70–99)
Glucose-Capillary: 215 mg/dL — ABNORMAL HIGH (ref 70–99)

## 2022-10-09 LAB — MAGNESIUM: Magnesium: 2.3 mg/dL (ref 1.7–2.4)

## 2022-10-09 MED ORDER — AMOXICILLIN 500 MG PO CAPS
500.0000 mg | ORAL_CAPSULE | Freq: Two times a day (BID) | ORAL | Status: DC
  Administered 2022-10-09 – 2022-10-10 (×3): 500 mg via ORAL
  Filled 2022-10-09 (×3): qty 1

## 2022-10-09 MED ORDER — LOSARTAN POTASSIUM 25 MG PO TABS
25.0000 mg | ORAL_TABLET | Freq: Every day | ORAL | Status: DC
  Administered 2022-10-09 – 2022-10-10 (×2): 25 mg via ORAL
  Filled 2022-10-09 (×2): qty 1

## 2022-10-09 MED ORDER — POTASSIUM CHLORIDE CRYS ER 20 MEQ PO TBCR
40.0000 meq | EXTENDED_RELEASE_TABLET | Freq: Once | ORAL | Status: AC
  Administered 2022-10-09: 40 meq via ORAL
  Filled 2022-10-09: qty 2

## 2022-10-09 NOTE — Progress Notes (Signed)
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  Hemoglobin 7.3 upon admission, started on PPI drip, GI consulted.  EGD 5/28 showed multiple angiodysplastic lesions in the gastric antrum requiring APC.  Cardiology recommended continue to hold Coumadin until outpatient follow-up.   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  Baseline hemoglobin 12, admission hemoglobin less than 8, 1 unit PRBC transfusion.  Seen by GI had EGD on 5/28 which showed multiple bleeding angiodysplastic lesion requiring APC.  Receiving IV iron, eventually will need p.o. upon discharge.  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   Resume home medications including chronic amoxicillin. Seen by [facility] cardiology, recommending continue to hold off Coumadin until outpatient follow-up where they will discuss either resuming it and or watchman's procedure   Atrial flutter -Current appears to be stable.  Coumadin on hold until outpatient follow-up   Pulmonary hypertension -no active issue      Hypertension -Resume home meds      COPD   -no acute exacerbation    DM type II -Sliding scale and Accu-Cheks  DVT prophylaxis: SCDs Start: 10/07/22 1205 Code Status: Full Family Communication:   Status is: Inpatient Ongoing management for GI bleed.  Advance diet as tolerated.      Diet Orders (From admission, onward)     Start     Ordered   10/08/22 1600  Diet clear liquid Room service appropriate? Yes; Fluid consistency: Thin  Diet effective now       Question Answer Comment  Room service appropriate? Yes   Fluid consistency: Thin      10/08/22 1249            Subjective:  Interpreter ID [id number] No complaints feeling well.  Wants something more to eat and drink  Examination: Constitutional: Not in acute distress Respiratory: Clear to auscultation bilaterally Cardiovascular: Normal sinus rhythm, no rubs Abdomen: Nontender nondistended good bowel sounds Musculoskeletal: No edema noted Skin: No rashes seen Neurologic: CN 2-12 grossly intact.  And nonfocal Psychiatric: Normal judgment and insight. Alert and oriented x 3. Normal mood.    Objective: Vitals:   10/08/22 1712 10/08/22 1959 10/08/22 2333 10/09/22 0419  BP: 136/67 118/75 114/61 111/73  Pulse: 62 68 64 64  Resp: 18 17 17 19   Temp: 98.1 F (36.7 C) 98.4 F (36.9 C) 98.1 F (36.7 C) 98.2 F (36.8 C)  TempSrc: Oral Oral Oral   SpO2: 96% 96% 95% 97%  Weight:      Height:        Intake/Output Summary (Last 24 hours) at 10/09/2022 0756 Last data filed at 10/08/2022 1840 Gross per 24 hour  Intake 1540 ml  Output 0 ml  Net 1540  ml   Filed Weights   10/07/22 0910 10/08/22 1256  Weight: 104.3 kg 104.7 kg    Scheduled Meds:  amoxicillin  500 mg Oral BID   insulin aspart  0-9 Units Subcutaneous Q4H   losartan  25 mg Oral Daily   pantoprazole (PROTONIX) IV  40 mg Intravenous Q12H   potassium chloride  40 mEq Oral Once   Continuous Infusions:  sodium chloride Stopped (10/07/22 1318)   iron sucrose Stopped (10/08/22 1002)     Nutritional status     Body mass index is 39.62 kg/m.  Data Reviewed:   CBC: Recent Labs  Lab 10/07/22 0917 10/08/22 0530 10/09/22 0339  WBC 8.5 7.2 7.4  NEUTROABS 5.3  --   --   HGB 7.3* 8.2* 7.9*  HCT 26.8* 29.9* 28.0*  MCV 72.0* 75.3* 73.5*  PLT 135* 119* 119*   Basic Metabolic Panel: Recent Labs  Lab 10/07/22 0917 10/08/22 0530 10/09/22 0339  NA 137 138 136  K 3.8 3.8 3.6  CL 105 106 102  CO2 26 25 27   GLUCOSE 262* 160* 156*  BUN 16 15 10   CREATININE 0.67 0.58* 0.61  CALCIUM 8.4* 8.2* 8.4*  MG  --  2.2 2.3  PHOS  --  2.9  --    GFR: Estimated Creatinine Clearance: 102.1 mL/min (by C-G formula based on SCr of 0.61 mg/dL). Liver Function Tests: Recent Labs  Lab 10/07/22 0917 10/08/22 0530  AST 46* 57*  ALT 56* 57*  ALKPHOS 58 51  BILITOT 0.7 0.8  PROT 7.0 6.3*  ALBUMIN 3.8 3.4*   No results for input(s): "LIPASE", "AMYLASE" in the last 168 hours. No results for input(s): "AMMONIA" in the last 168 hours. Coagulation Profile: Recent Labs  Lab 10/07/22 0917  INR 1.8*   Cardiac Enzymes: No results for input(s): "CKTOTAL", "CKMB", "CKMBINDEX", "TROPONINI" in the last 168 hours. BNP (last 3 results) No results for input(s): "PROBNP" in the last 8760 hours. HbA1C: Recent Labs    10/07/22 0916  HGBA1C 9.2*   CBG: Recent Labs  Lab 10/08/22 1301 10/08/22 1636 10/08/22 2005 10/08/22 2327 10/09/22 0416  GLUCAP 139* 102* 179* 142* 142*   Lipid Profile: No results for input(s): "CHOL", "HDL", "LDLCALC", "TRIG", "CHOLHDL", "LDLDIRECT" in the last 72 hours. Thyroid Function Tests: No results for input(s): "TSH", "T4TOTAL", "FREET4", "T3FREE", "THYROIDAB" in the last 72 hours. Anemia Panel: Recent Labs    10/07/22 0916 10/07/22 1448 10/08/22 1239  VITAMINB12  --   --  281  FOLATE 25.0  --   --   FERRITIN 6*  --   --   TIBC 547*  --   --   IRON 29*  --   --   RETICCTPCT  --  1.8  --    Sepsis Labs: No results for input(s):  "PROCALCITON", "LATICACIDVEN" in the last 168 hours.  No results found for this or any previous visit (from the past 240 hour(s)).       Radiology Studies: No results found.         LOS: 2 days   Time spent= 35 mins    Nashley Cordoba Joline Maxcy, MD [facility]  If 7PM-7AM, please contact night-coverage  10/09/2022, 7:56 AM

## 2022-10-09 NOTE — TOC CM/SW Note (Signed)
Transition of Care [facility]) - Inpatient Brief Assessment   Patient Details  Name: Joseph Raymond MRN: [id number] Date of Birth: [birth year]  Transition of Care [facility]) CM/SW Contact:    Truddie Hidden, RN Phone Number: 10/09/2022, 10:26 AM   Clinical Narrative: TOC continues to provide ongoing assessment for needs and discharge planning.   Transition of Care Asessment: Insurance and Status: Insurance coverage has been reviewed Patient has primary care physician: Yes Home environment has been reviewed: Plans to return home Prior level of function:: Single family home Prior/Current Home Services: No current home services Social Determinants of Health Reivew: SDOH reviewed no interventions necessary Readmission risk has been reviewed: Yes Transition of care needs: no transition of care needs at this time

## 2022-10-09 NOTE — Consult Note (Signed)
PHARMACY CONSULT NOTE - FOLLOW UP  Pharmacy Consult for Electrolyte Monitoring and Replacement   Recent Labs: Potassium (mmol/L)  Date Value  10/09/2022 3.6   Magnesium (mg/dL)  Date Value  16/02/9603 2.3   Calcium (mg/dL)  Date Value  54/01/8118 8.4 (L)   Albumin (g/dL)  Date Value  14/78/2956 3.4 (L)   Phosphorus (mg/dL)  Date Value  21/30/8657 2.9   Sodium (mmol/L)  Date Value  10/09/2022 136   Assessment: 62 y.o. 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 Pt received pRBC x 1 on 5/27 and is scheduled to undergo upper endoscopy on 5/28.  Diet: thin fluids EGD 10/08/22 findings: "Multiple bleeding angiodysplastic lesions in the stomach"  Goal of Therapy:  K >/= 4.0 and Mg >/= 2.0  Plan:  K 3.8 > 3.6. Kcl PO 40 meq x 1.  Will follow-up electrolytes again with AM labs  Elliot Gurney, PharmD, BCPS Clinical Pharmacist  10/09/2022 7:28 AM

## 2022-10-09 NOTE — Anesthesia Postprocedure Evaluation (Signed)
Anesthesia Post Note  Patient: Joseph Raymond  Procedure(s) Performed: ESOPHAGOGASTRODUODENOSCOPY (EGD) WITH PROPOFOL  Patient location during evaluation: Endoscopy Anesthesia Type: General Level of consciousness: awake and alert Pain management: pain level controlled Vital Signs Assessment: post-procedure vital signs reviewed and stable Respiratory status: spontaneous breathing, nonlabored ventilation, respiratory function stable and patient connected to nasal cannula oxygen Cardiovascular status: blood pressure returned to baseline and stable Postop Assessment: no apparent nausea or vomiting Anesthetic complications: no   No notable events documented.   Last Vitals:  Vitals:   10/08/22 2333 10/09/22 0419  BP: 114/61 111/73  Pulse: 64 64  Resp: 17 19  Temp: 36.7 C 36.8 C  SpO2: 95% 97%    Last Pain:  Vitals:   10/09/22 0420  TempSrc:   PainSc: 0-No pain                 Cleda Mccreedy Susanne Baumgarner

## 2022-10-10 DIAGNOSIS — K922 Gastrointestinal hemorrhage, unspecified: Secondary | ICD-10-CM | POA: Diagnosis not present

## 2022-10-10 LAB — BASIC METABOLIC PANEL
Anion gap: 3 — ABNORMAL LOW (ref 5–15)
BUN: 9 mg/dL (ref 8–23)
CO2: 29 mmol/L (ref 22–32)
Calcium: 8.4 mg/dL — ABNORMAL LOW (ref 8.9–10.3)
Chloride: 103 mmol/L (ref 98–111)
Creatinine, Ser: 0.72 mg/dL (ref 0.61–1.24)
GFR, Estimated: >60 mL/min (ref >60)
Glucose, Bld: 131 mg/dL — ABNORMAL HIGH (ref 70–99)
Potassium: 3.8 mmol/L (ref 3.5–5.1)
Sodium: 135 mmol/L (ref 135–145)

## 2022-10-10 LAB — CBC
HCT: 30.4 % — ABNORMAL LOW (ref 39.0–52.0)
Hemoglobin: 8.4 g/dL — ABNORMAL LOW (ref 13.0–17.0)
MCH: 20.6 pg — ABNORMAL LOW (ref 26.0–34.0)
MCHC: 27.6 g/dL — ABNORMAL LOW (ref 30.0–36.0)
MCV: 74.7 fL — ABNORMAL LOW (ref 80.0–100.0)
Platelets: 120 10*3/uL — ABNORMAL LOW (ref 150–400)
RBC: 4.07 MIL/uL — ABNORMAL LOW (ref 4.22–5.81)
RDW: 20.2 % — ABNORMAL HIGH (ref 11.5–15.5)
WBC: 8.6 10*3/uL (ref 4.0–10.5)
nRBC: 0.5 % — ABNORMAL HIGH (ref 0.0–0.2)

## 2022-10-10 LAB — GLUCOSE, CAPILLARY
Glucose-Capillary: 126 mg/dL — ABNORMAL HIGH (ref 70–99)
Glucose-Capillary: 139 mg/dL — ABNORMAL HIGH (ref 70–99)

## 2022-10-10 LAB — MAGNESIUM: Magnesium: 2.3 mg/dL (ref 1.7–2.4)

## 2022-10-10 MED ORDER — SENNOSIDES-DOCUSATE SODIUM 8.6-50 MG PO TABS: 1.0000 | ORAL_TABLET | Freq: Every evening | ORAL | 0 refills | Status: AC | PRN

## 2022-10-10 MED ORDER — FERROUS SULFATE 325 (65 FE) MG PO TBEC: 325.0000 mg | DELAYED_RELEASE_TABLET | Freq: Every day | ORAL | 0 refills | Status: DC

## 2022-10-10 MED ORDER — PANTOPRAZOLE SODIUM 40 MG PO TBEC: 40.0000 mg | DELAYED_RELEASE_TABLET | Freq: Two times a day (BID) | ORAL | 0 refills | Status: AC

## 2022-10-10 NOTE — Care Management Important Message (Signed)
Important Message  Patient Details  Name: Joseph Raymond MRN: [id number] Date of Birth: 09-12-1960   Medicare Important Message Given:  Yes     Johnell Comings 10/10/2022, 10:59 AM

## 2022-10-10 NOTE — Plan of Care (Signed)
°  Problem: Clinical Measurements: °Goal: Ability to maintain clinical measurements within normal limits will improve °Outcome: Progressing °  °Problem: Clinical Measurements: °Goal: Will remain free from infection °Outcome: Progressing °  °Problem: Clinical Measurements: °Goal: Respiratory complications will improve °Outcome: Progressing °  °Problem: Activity: °Goal: Risk for activity intolerance will decrease °Outcome: Progressing °  °Problem: Pain Managment: °Goal: General experience of comfort will improve °Outcome: Progressing °  °

## 2022-10-10 NOTE — Discharge Summary (Signed)
Physician Discharge Summary  Joseph Raymond ZOX:[id number] DOB: [birth year] DOA: 10/07/2022  PCP: [name]  Admit date: 10/07/2022 Discharge date: 10/10/2022  Admitted From: Home Disposition: Home  Recommendations for Outpatient Follow-up:  Follow up with PCP in 1-2 weeks Please obtain BMP/CBC in one week your next doctors visit.  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  Hemoglobin 7.3 upon admission, started on PPI drip, GI consulted.  EGD 5/28 showed multiple angiodysplastic lesions in the gastric antrum requiring APC.  Cardiology recommended continue to hold Coumadin until outpatient follow-up. Hemoglobin remained stable today.  Will discharge patient home with recommendations as stated above.   Assessment & Plan:  Principal Problem:   Upper GI bleed Active Problems:   Melena   Angiodysplasia of stomach and duodenum with hemorrhage      Acute upper GI bleed with melena Acute blood loss anemia, iron deficiency -Patient has prior history of gastric AVMs.  Baseline hemoglobin 12, admission hemoglobin less than 8, 1 unit PRBC transfusion.  Seen by GI had EGD on 5/28 which showed multiple bleeding angiodysplastic lesion requiring APC.  Discussed with GI on the day of discharge.  PPI twice daily and outpatient follow-up.  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   Resume home medications including chronic amoxicillin. Seen by [facility] cardiology, recommending continue to hold off Coumadin until outpatient follow-up where they will discuss either resuming it and or watchman's procedure   Atrial flutter -Current appears to be stable.  Coumadin on hold until outpatient follow-up   Pulmonary hypertension -no active issue      Hypertension -Resume home meds      COPD  -no acute exacerbation    DM type II - Not on any home meds  Consultations: Gastroenterology [facility] cardiology  Subjective: Doing well no complaints.  Wishes to go home.  Discussed with the help of interpreter.  Interpreter ID [id number]  Discharge Exam: Vitals:   10/10/22 0415 10/10/22 0848  BP: 107/65 125/66  Pulse: 65 72  Resp: 17 18  Temp: 97.9 F (36.6 C) 98.5 F (36.9 C)  SpO2: 96% 94%   Vitals:   10/09/22 2347 10/10/22 0415 10/10/22 0848 10/10/22 0900  BP: (!) 102/58 107/65 125/66   Pulse: 66 65 72   Resp: 18 17 18    Temp: 97.8 F (36.6 C) 97.9 F (36.6 C) 98.5 F (36.9 C)   TempSrc: Oral Oral    SpO2: 100% 96% 94%   Weight:    100.5 kg  Height:        General: Pt is alert, awake, not in acute distress Cardiovascular: RRR, S1/S2 +, no rubs, no gallops Respiratory: CTA bilaterally, no wheezing, no rhonchi Abdominal: Soft, NT, ND, bowel sounds + Extremities: no edema, no cyanosis  Discharge Instructions   Allergies as of 10/10/2022   No  Known Allergies      Medication List     STOP taking these medications    warfarin 2 MG tablet Commonly known as: COUMADIN   warfarin 4 MG tablet Commonly known as: COUMADIN       TAKE these medications    amoxicillin 500 MG capsule Commonly known as: AMOXIL Take 1 capsule  (500 mg total) by mouth 2 (two) times daily.   ferrous sulfate 325 (65 FE) MG EC tablet Take 1 tablet (325 mg total) by mouth daily with breakfast.   losartan 25 MG tablet Commonly known as: COZAAR Take 25 mg by mouth daily.   pantoprazole 40 MG tablet Commonly known as: PROTONIX Take 1 tablet (40 mg total) by mouth 2 (two) times daily before a meal. What changed: when to take this   senna-docusate 8.6-50 MG tablet Commonly known as: Senokot-S Take 1-2 tablets by mouth at bedtime as needed for moderate constipation or mild constipation.   terbinafine 250 MG tablet Commonly known as: LAMISIL Take 250 mg by mouth daily.        Follow-up Information     Alwyn Pea, MD. Go in 1 week(s).   Specialties: Cardiology, Internal Medicine Why: Appointment on Friday, 10/18/2022 at 9:00am. Contact information: [address] [place] [address] [phone]         [name] Follow up in 1 week(s).   Specialty: General Practice Contact information: [address] [place] [address] [phone]                No Known Allergies  You were cared for by a hospitalist during your hospital stay. If you have any questions about your discharge medications or the care you received while you were in the hospital after you are discharged, you can call the unit and asked to speak with the hospitalist on call if the hospitalist that took care of you is not available. Once you are discharged, your primary care physician will handle any further medical issues. Please note that no refills for any discharge medications will be authorized once you are discharged, as it is imperative that you return to your primary care physician (or establish a relationship with a primary care physician if you do not have one) for your aftercare needs so that they can reassess your need for medications and monitor your lab values.  You were cared  for by a hospitalist during your hospital stay. If you have any questions about your discharge medications or the care you received while you were in the hospital after you are discharged, you can call the unit and asked to speak with the hospitalist on call if the hospitalist that took care of you is not available. Once you are discharged, your primary care physician will handle any further medical issues. Please note that NO REFILLS for any discharge medications will be authorized once you are discharged, as it is imperative that you return to your primary care physician (or establish a relationship with a primary care physician if you do not have one) for your aftercare needs so that they can reassess your need for medications and monitor your lab values.  Please request your Prim.MD to go over all Hospital Tests and Procedure/Radiological results at the follow up, please get all Hospital records sent to your Prim MD by signing hospital release before you go home.  Get CBC, CMP, 2 view Chest X ray checked  by Primary MD during  your next visit or SNF MD in 5-7 days ( we routinely change or add medications that can affect your baseline labs and fluid status, therefore we recommend that you get the mentioned basic workup next visit with your PCP, your PCP may decide not to get them or add new tests based on their clinical decision)  On your next visit with your primary care physician please Get Medicines reviewed and adjusted.  If you experience worsening of your admission symptoms, develop shortness of breath, life threatening emergency, suicidal or homicidal thoughts you must seek medical attention immediately by calling 911 or calling your MD immediately  if symptoms less severe.  You Must read complete instructions/literature along with all the possible adverse reactions/side effects for all the Medicines you take and that have been prescribed to you. Take any new Medicines after you have completely  understood and accpet all the possible adverse reactions/side effects.   Do not drive, operate heavy machinery, perform activities at heights, swimming or participation in water activities or provide baby sitting services if your were admitted for syncope or siezures until you have seen by Primary MD or a Neurologist and advised to do so again.  Do not drive when taking Pain medications.   Procedures/Studies: No results found.   The results of significant diagnostics from this hospitalization (including imaging, microbiology, ancillary and laboratory) are listed below for reference.     Microbiology: No results found for this or any previous visit (from the past 240 hour(s)).   Labs: BNP (last 3 results) No results for input(s): "BNP" in the last 8760 hours. Basic Metabolic Panel: Recent Labs  Lab 10/07/22 0917 10/08/22 0530 10/09/22 0339 10/10/22 0320  NA 137 138 136 135  K 3.8 3.8 3.6 3.8  CL 105 106 102 103  CO2 26 25 27 29   GLUCOSE 262* 160* 156* 131*  BUN 16 15 10 9   CREATININE 0.67 0.58* 0.61 0.72  CALCIUM 8.4* 8.2* 8.4* 8.4*  MG  --  2.2 2.3 2.3  PHOS  --  2.9  --   --    Liver Function Tests: Recent Labs  Lab 10/07/22 0917 10/08/22 0530  AST 46* 57*  ALT 56* 57*  ALKPHOS 58 51  BILITOT 0.7 0.8  PROT 7.0 6.3*  ALBUMIN 3.8 3.4*   No results for input(s): "LIPASE", "AMYLASE" in the last 168 hours. No results for input(s): "AMMONIA" in the last 168 hours. CBC: Recent Labs  Lab 10/07/22 0917 10/08/22 0530 10/09/22 0339 10/10/22 0320  WBC 8.5 7.2 7.4 8.6  NEUTROABS 5.3  --   --   --   HGB 7.3* 8.2* 7.9* 8.4*  HCT 26.8* 29.9* 28.0* 30.4*  MCV 72.0* 75.3* 73.5* 74.7*  PLT 135* 119* 119* 120*   Cardiac Enzymes: No results for input(s): "CKTOTAL", "CKMB", "CKMBINDEX", "TROPONINI" in the last 168 hours. BNP: Invalid input(s): "POCBNP" CBG: Recent Labs  Lab 10/09/22 1711 10/09/22 2012 10/09/22 2345 10/10/22 0415 10/10/22 0851  GLUCAP 115* 215*  165* 126* 139*   D-Dimer No results for input(s): "DDIMER" in the last 72 hours. Hgb A1c No results for input(s): "HGBA1C" in the last 72 hours. Lipid Profile No results for input(s): "CHOL", "HDL", "LDLCALC", "TRIG", "CHOLHDL", "LDLDIRECT" in the last 72 hours. Thyroid function studies No results for input(s): "TSH", "T4TOTAL", "T3FREE", "THYROIDAB" in the last 72 hours.  Invalid input(s): "FREET3" Anemia work up Recent Labs    10/07/22 1448 10/08/22 1239  VITAMINB12  --  281  RETICCTPCT 1.8  --  Urinalysis    Component Value Date/Time   COLORURINE AMBER (A) 08/13/2020 0927   APPEARANCEUR CLOUDY (A) 08/13/2020 0927   LABSPEC 1.018 08/13/2020 0927   PHURINE 5.0 08/13/2020 0927   GLUCOSEU NEGATIVE 08/13/2020 0927   HGBUR MODERATE (A) 08/13/2020 0927   BILIRUBINUR NEGATIVE 08/13/2020 0927   KETONESUR 20 (A) 08/13/2020 0927   PROTEINUR NEGATIVE 08/13/2020 0927   UROBILINOGEN 0.2 08/01/2008 1221   NITRITE NEGATIVE 08/13/2020 0927   LEUKOCYTESUR MODERATE (A) 08/13/2020 0927   Sepsis Labs Recent Labs  Lab 10/07/22 0917 10/08/22 0530 10/09/22 0339 10/10/22 0320  WBC 8.5 7.2 7.4 8.6   Microbiology No results found for this or any previous visit (from the past 240 hour(s)).   Time coordinating discharge:  I have spent 35 minutes face to face with the patient and on the ward discussing the patients care, assessment, plan and disposition with other care givers. >50% of the time was devoted counseling the patient about the risks and benefits of treatment/Discharge disposition and coordinating care.   SIGNED:   Dimple Nanas, MD  [facility] 10/10/2022, 11:05 AM   If 7PM-7AM, please contact night-coverage

## 2022-10-10 NOTE — Progress Notes (Signed)
Pt loss IV access. No IV iron infusion given per Dr. Nelson Chimes it is okay because he is discharging pt now.

## 2022-10-10 NOTE — Plan of Care (Signed)
  Problem: Education: Goal: Ability to describe self-care measures that may prevent or decrease complications (Diabetes Survival Skills Education) will improve Outcome: Progressing Goal: Individualized Educational Video(s) Outcome: Progressing   Problem: Coping: Goal: Ability to adjust to condition or change in health will improve Outcome: Progressing   Problem: Fluid Volume: Goal: Ability to maintain a balanced intake and output will improve Outcome: Progressing   Problem: Health Behavior/Discharge Planning: Goal: Ability to identify and utilize available resources and services will improve Outcome: Progressing Goal: Ability to manage health-related needs will improve Outcome: Progressing   Problem: Nutritional: Goal: Maintenance of adequate nutrition will improve Outcome: Progressing Goal: Progress toward achieving an optimal weight will improve Outcome: Progressing   Problem: Skin Integrity: Goal: Risk for impaired skin integrity will decrease Outcome: Progressing   Problem: Tissue Perfusion: Goal: Adequacy of tissue perfusion will improve Outcome: Progressing   Problem: Education: Goal: Knowledge of General Education information will improve Description: Including pain rating scale, medication(s)/side effects and non-pharmacologic comfort measures Outcome: Progressing   Problem: Health Behavior/Discharge Planning: Goal: Ability to manage health-related needs will improve Outcome: Progressing   Problem: Clinical Measurements: Goal: Ability to maintain clinical measurements within normal limits will improve Outcome: Progressing Goal: Will remain free from infection Outcome: Progressing Goal: Diagnostic test results will improve Outcome: Progressing Goal: Respiratory complications will improve Outcome: Progressing Goal: Cardiovascular complication will be avoided Outcome: Progressing   Problem: Activity: Goal: Risk for activity intolerance will  decrease Outcome: Progressing   Problem: Nutrition: Goal: Adequate nutrition will be maintained Outcome: Progressing   Problem: Coping: Goal: Level of anxiety will decrease Outcome: Progressing   Problem: Elimination: Goal: Will not experience complications related to bowel motility Outcome: Progressing Goal: Will not experience complications related to urinary retention Outcome: Progressing   Problem: Pain Managment: Goal: General experience of comfort will improve Outcome: Progressing   Problem: Safety: Goal: Ability to remain free from injury will improve Outcome: Progressing   Problem: Skin Integrity: Goal: Risk for impaired skin integrity will decrease Outcome: Progressing   

## 2022-10-10 NOTE — Consult Note (Signed)
PHARMACY CONSULT NOTE  Pharmacy Consult for Electrolyte Monitoring and Replacement   Recent Labs: Potassium (mmol/L)  Date Value  10/10/2022 3.8   Magnesium (mg/dL)  Date Value  84/13/2440 2.3   Calcium (mg/dL)  Date Value  03/09/2535 8.4 (L)   Albumin (g/dL)  Date Value  64/40/3474 3.4 (L)   Phosphorus (mg/dL)  Date Value  25/95/6387 2.9   Sodium (mmol/L)  Date Value  10/10/2022 135   Assessment: 62 y.o. 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 Pt received pRBC x 1 on 5/27 and is scheduled to undergo upper endoscopy on 5/28.  Goal of Therapy:  Electrolytes within normal limits  Plan:  --No electrolyte replacement indicated at this time --Follow-up electrolytes with AM labs tomorrow  Tressie Ellis 10/10/2022 7:22 AM

## 2022-10-11 LAB — TYPE AND SCREEN: Antibody Screen: NEGATIVE

## 2022-10-11 LAB — BPAM RBC
Blood Product Expiration Date: 202406172359
Blood Product Expiration Date: 202406302359
Blood Product Expiration Date: 202406302359
Unit Type and Rh: 5100
Unit Type and Rh: 5100
Unit Type and Rh: 5100

## 2023-04-21 ENCOUNTER — Emergency Department: Payer: Medicare HMO

## 2023-04-21 ENCOUNTER — Emergency Department
Admit: 2023-04-21 | Discharge: 2023-04-21 | Disposition: A | Discharge status: Home / Self Care | Payer: Medicare HMO | Attending: Student | Admitting: Student

## 2023-04-21 ENCOUNTER — Encounter: Payer: Self-pay | Admitting: Emergency Medicine

## 2023-04-21 ENCOUNTER — Emergency Department
Admission: EM | Admit: 2023-04-21 | Discharge: 2023-04-21 | Disposition: A | Discharge status: Home / Self Care | Payer: Medicare HMO | Attending: Emergency Medicine | Admitting: Emergency Medicine

## 2023-04-21 ENCOUNTER — Other Ambulatory Visit: Payer: Self-pay

## 2023-04-21 DIAGNOSIS — Z20822 Contact with and (suspected) exposure to covid-19: Secondary | ICD-10-CM | POA: Diagnosis not present

## 2023-04-21 DIAGNOSIS — I509 Heart failure, unspecified: Secondary | ICD-10-CM | POA: Diagnosis not present

## 2023-04-21 DIAGNOSIS — R0602 Shortness of breath: Secondary | ICD-10-CM | POA: Diagnosis present

## 2023-04-21 DIAGNOSIS — E119 Type 2 diabetes mellitus without complications: Secondary | ICD-10-CM | POA: Insufficient documentation

## 2023-04-21 DIAGNOSIS — I110 Hypertensive heart disease with heart failure: Secondary | ICD-10-CM | POA: Diagnosis not present

## 2023-04-21 LAB — RESP PANEL BY RT-PCR (RSV, FLU A&B, COVID)  RVPGX2
Influenza A by PCR: NEGATIVE
Influenza B by PCR: NEGATIVE
Resp Syncytial Virus by PCR: NEGATIVE
SARS Coronavirus 2 by RT PCR: NEGATIVE

## 2023-04-21 LAB — CBC WITH DIFFERENTIAL/PLATELET
Abs Immature Granulocytes: 0.04 10*3/uL (ref 0.00–0.07)
Basophils Absolute: 0 10*3/uL (ref 0.0–0.1)
Basophils Relative: 1 %
Eosinophils Absolute: 0.4 10*3/uL (ref 0.0–0.5)
Eosinophils Relative: 4 %
HCT: 45.4 % (ref 39.0–52.0)
Hemoglobin: 14.9 g/dL (ref 13.0–17.0)
Immature Granulocytes: 1 %
Lymphocytes Relative: 18 %
Lymphs Abs: 1.5 10*3/uL (ref 0.7–4.0)
MCH: 30.5 pg (ref 26.0–34.0)
MCHC: 32.8 g/dL (ref 30.0–36.0)
MCV: 93 fL (ref 80.0–100.0)
Monocytes Absolute: 0.6 10*3/uL (ref 0.1–1.0)
Monocytes Relative: 7 %
Neutro Abs: 5.7 10*3/uL (ref 1.7–7.7)
Neutrophils Relative %: 69 %
Platelets: 120 10*3/uL — ABNORMAL LOW (ref 150–400)
RBC: 4.88 MIL/uL (ref 4.22–5.81)
RDW: 13.2 % (ref 11.5–15.5)
WBC: 8.2 10*3/uL (ref 4.0–10.5)
nRBC: 0 % (ref 0.0–0.2)

## 2023-04-21 LAB — ECHOCARDIOGRAM COMPLETE
AR max vel: 1.25 cm2
AV Area VTI: 1.39 cm2
AV Area mean vel: 1.31 cm2
AV Mean grad: 19.5 mm[Hg]
AV Peak grad: 40.3 mm[Hg]
Ao pk vel: 3.18 m/s
Area-P 1/2: 2.95 cm2
Height: 64 in
MV VTI: 0.54 cm2
S' Lateral: 2.3 cm
Weight: 3584 [oz_av]

## 2023-04-21 LAB — TROPONIN I (HIGH SENSITIVITY)
Troponin I (High Sensitivity): 26 ng/L — ABNORMAL HIGH (ref <18)
Troponin I (High Sensitivity): 29 ng/L — ABNORMAL HIGH (ref <18)

## 2023-04-21 LAB — BASIC METABOLIC PANEL
Anion gap: 12 (ref 5–15)
BUN: 16 mg/dL (ref 8–23)
CO2: 22 mmol/L (ref 22–32)
Calcium: 9.1 mg/dL (ref 8.9–10.3)
Chloride: 103 mmol/L (ref 98–111)
Creatinine, Ser: 0.73 mg/dL (ref 0.61–1.24)
GFR, Estimated: >60 mL/min (ref >60)
Glucose, Bld: 273 mg/dL — ABNORMAL HIGH (ref 70–99)
Potassium: 3.7 mmol/L (ref 3.5–5.1)
Sodium: 137 mmol/L (ref 135–145)

## 2023-04-21 LAB — BRAIN NATRIURETIC PEPTIDE: B Natriuretic Peptide: 548.4 pg/mL — ABNORMAL HIGH (ref 0.0–100.0)

## 2023-04-21 LAB — PROTIME-INR
INR: 1.1 (ref 0.8–1.2)
Prothrombin Time: 14.7 s (ref 11.4–15.2)

## 2023-04-21 MED ORDER — FUROSEMIDE 20 MG PO TABS: 20.0000 mg | ORAL_TABLET | Freq: Every day | ORAL | 0 refills | Status: DC

## 2023-04-21 MED ORDER — AMOXICILLIN 500 MG PO CAPS: 500.0000 mg | ORAL_CAPSULE | Freq: Two times a day (BID) | ORAL | 2 refills | Status: AC

## 2023-04-21 MED ORDER — FUROSEMIDE 10 MG/ML IJ SOLN
80.0000 mg | Freq: Once | INTRAMUSCULAR | Status: AC
  Administered 2023-04-21: 80 mg via INTRAVENOUS
  Filled 2023-04-21: qty 8

## 2023-04-21 MED ORDER — IOHEXOL 350 MG/ML SOLN
80.0000 mL | Freq: Once | INTRAVENOUS | Status: AC | PRN
  Administered 2023-04-21: 80 mL via INTRAVENOUS

## 2023-04-21 NOTE — ED Provider Notes (Addendum)
[facility] Provider Note    Event Date/Time   First MD Initiated Contact with Patient 04/21/23 1046     (approximate)   History   Shortness of Breath   HPI  Joseph Raymond is a 62 y.o. male   Past medical history of aortic valve replacement, diabetes, chronic back pain, hypertension, valve replacement on suppressive antibiotic therapy with amoxicillin, underlying history of paroxysmal atrial flutter not on anticoagulation due to GI bleeding who presents to the emergency department with exertional dyspnea and orthopnea over the last 1 week with a mild cough as well.  He has no chest pain or chest discomfort whatsoever.  He has no unilateral leg swelling.  He has no fever or chills.  He stopped taking his amoxicillin 2 weeks ago because he says he never got a refill  He denies any significant swelling.  I spoke with this patient and obtained information via Spanish interpreter  Independent Historian contributed to assessment above: His daughter is at bedside to corroborate information past medical history as above  External Medical Documents Reviewed: Dr. Glennis Brink cardiology note dated 03/03/2023 documented past medical history      Physical Exam   Triage Vital Signs: ED Triage Vitals  Encounter Vitals Group     BP 04/21/23 1008 137/86     Systolic BP Percentile --      Diastolic BP Percentile --      Pulse Rate 04/21/23 1008 93     Resp 04/21/23 1007 20     Temp 04/21/23 1007 97.9 F (36.6 C)     Temp Source 04/21/23 1007 Oral     SpO2 04/21/23 1008 91 %     Weight 04/21/23 1007 224 lb (101.6 kg)     Height 04/21/23 1007 5\' 4"  (1.626 m)     Head Circumference --      Peak Flow --      Pain Score 04/21/23 1007 0     Pain Loc --      Pain Education --      Exclude from Growth Chart --     Most recent vital signs: Vitals:   04/21/23 1007 04/21/23 1008  BP:  137/86  Pulse:  93  Resp: 20   Temp: 97.9 F (36.6 C)   SpO2:  91%     General: Awake, no distress.  CV:  Good peripheral perfusion.  Resp:  Normal effort.  Abd:  No distention.  Other:  Awake alert oriented comfortable appearing with no respiratory distress.  SpO2 in the low to mid 90s on room air.  Clear lungs.  No significant peripheral edema.  His bedside ultrasound does show adequate ejection fraction grossly, but B-lines bilaterally in the lung fields   ED Results / Procedures / Treatments   Labs (all labs ordered are listed, but only abnormal results are displayed) Labs Reviewed  BASIC METABOLIC PANEL - Abnormal; Notable for the following components:      Result Value   Glucose, Bld 273 (*)    All other components within normal limits  CBC WITH DIFFERENTIAL/PLATELET - Abnormal; Notable for the following components:   Platelets 120 (*)    All other components within normal limits  BRAIN NATRIURETIC PEPTIDE - Abnormal; Notable for the following components:   B Natriuretic Peptide 548.4 (*)    All other components within normal limits  TROPONIN I (HIGH SENSITIVITY) - Abnormal; Notable for the following components:   Troponin I (High Sensitivity) 26 (*)  All other components within normal limits  TROPONIN I (HIGH SENSITIVITY) - Abnormal; Notable for the following components:   Troponin I (High Sensitivity) 29 (*)    All other components within normal limits  RESP PANEL BY RT-PCR (RSV, FLU A&B, COVID)  [id number]  PROTIME-INR     I ordered and reviewed the above labs they are notable for troponin 7 flat in the 20s, BNP mildly elevated 540  EKG  ED ECG REPORT I, Pilar Jarvis, the attending physician, personally viewed and interpreted this ECG.   Date: 04/21/2023  EKG Time: 1149  Rate: 70  Rhythm: sinus  Axis: RAD  Intervals: nl  ST&T Change: mild ST elevations in the lateral leads with no reciprocal changes    RADIOLOGY I independently reviewed and interpreted chest x-ray and see diffuse mild opacities concerning for pulmonary  edema I also reviewed radiologist's formal read.   PROCEDURES:  Critical Care performed: No  Procedures   MEDICATIONS ORDERED IN ED: Medications  furosemide (LASIX) injection 80 mg (80 mg Intravenous Given 04/21/23 1222)  iohexol (OMNIPAQUE) 350 MG/ML injection 80 mL (80 mLs Intravenous Contrast Given 04/21/23 1208)    External physician / consultants:  I spoke with Dr. [name] cardiology regarding care plan for this patient.   IMPRESSION / MDM / ASSESSMENT AND PLAN / ED COURSE  I reviewed the triage vital signs and the nursing notes.                                Patient's presentation is most consistent with acute presentation with potential threat to life or bodily function.  Differential diagnosis includes, but is not limited to, CHF exacerbation, valve dysfunction, respiratory infection/bacterial pneumonia, PE, considered but less likely endocarditis   The patient is on the cardiac monitor to evaluate for evidence of arrhythmia and/or significant heart rate changes.  MDM:    This is a patient with symptoms most consistent with pulmonary edema and CHF exacerbation given orthopnea and exertional dyspnea with B-lines on ultrasound and slightly elevated BNP and a CT angiogram that shows no blood clot but does show signs of pulmonary edema.  He does have a mild cough but with no leukocytosis fever or focal opacities noted on imaging I doubt this is representative of a bacterial pneumonia.  He has a valve replacement has been off of his amoxicillin for a couple of weeks.  He does not have any infectious symptoms.  He does not have any infectious signs on my exam.  I doubt this is a valve infection or endocarditis.  He has no chest pain or discomfort whatsoever but his EKG does show some mild elevations in the lateral leads I do not think this represents cardiac ischemia and did not call STEMI though I did consult with cardiology team to review.  They have ordered an  echocardiogram to be performed in the emergency department.  I have ordered for IV diuretics in the setting of likely CHF exacerbation he is diuresed markedly well in the emergency department approaching 2 L of output.  Workup is complete pending cardiology recommendations after the echocardiogram is performed after which disposition can be discussed with cardiology team.  Renewed his amoxicillin        FINAL CLINICAL IMPRESSION(S) / ED DIAGNOSES   Final diagnoses:  Acute on chronic congestive heart failure, unspecified heart failure type [facility])     Rx / DC Orders   ED Discharge  Orders          Ordered    amoxicillin (AMOXIL) 500 MG capsule  2 times daily        04/21/23 1457             Note:  This document was prepared using Dragon voice recognition software and may include unintentional dictation errors.    Pilar Jarvis, MD 04/21/23 1457    Pilar Jarvis, MD 04/21/23 [phone]

## 2023-04-21 NOTE — ED Notes (Signed)
Patient to CT at this time

## 2023-04-21 NOTE — Progress Notes (Signed)
*  PRELIMINARY RESULTS* Echocardiogram 2D Echocardiogram has been performed.  Carolyne Fiscal 04/21/2023, 3:57 PM

## 2023-04-21 NOTE — Consult Note (Addendum)
[facility] CLINIC CARDIOLOGY CONSULT NOTE       Patient ID: Joseph Raymond MRN: [id number] DOB/AGE: July 23, 1960 62 y.o.  Admit date: 04/21/2023 Referring Physician Dr. Pilar [name]  Primary Cardiologist Dr. [name] Reason for Consultation shortness of breath  HPI: Joseph Raymond is a 62 y.o. 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 Cardiology was consulted for further evaluation.   Patient reports that for the past 2 weeks he has had worsening shortness of breath and dyspnea on exertion.  States that at rest he feels okay but anytime he gets up and walks around he begins feeling very short of breath.  Given his worsening symptoms he decided to come to the ED for further evaluation.  Workup in the ED notable for creatinine 0.73, potassium 3.7, hemoglobin 14.9, WBC 8.2.  BNP elevated at 548.  Troponins 26 > 29.  EKG with normal sinus rhythm, PACs rate 79 bpm, nonspecific ST-T changes.  Chest x-ray with mild pulmonary vascular congestion and bilateral pleural effusions.  CTA chest negative for pulmonary embolism.  He was started on IV Lasix in the ED.   At the time of my evaluation this afternoon the patient is resting in ED stretcher.  We discussed his recent symptoms in detail.  He states that over the last 2 weeks he has had issues with worsening shortness of breath but denies any problems with lower extremity edema.  States that he had similar symptoms prior to his valve replacement in 2018.  He denies any chest pain or palpitations.  States that overall his shortness of breath is only mild at rest, really only has issues with this when he is up walking around.  He also denies any orthopnea.  History obtained via use of  interpreter.  Review of systems complete and found to be negative unless listed above    Past Medical History:  Diagnosis Date   Chronic back pain    Diabetes mellitus without complication (HCC)    Hypertension    Lumbar radiculopathy     Past Surgical History:  Procedure Laterality Date   ESOPHAGOGASTRODUODENOSCOPY N/A 10/01/2019   Procedure: ESOPHAGOGASTRODUODENOSCOPY (EGD);  Surgeon: Toledo, Boykin Nearing, MD;  Location: [facility] ENDOSCOPY;  Service: Gastroenterology;  Laterality: N/A;   ESOPHAGOGASTRODUODENOSCOPY (EGD) WITH PROPOFOL N/A 10/08/2022   Procedure: ESOPHAGOGASTRODUODENOSCOPY (EGD) WITH PROPOFOL;  Surgeon: Midge Minium, MD;  Location: [facility] ENDOSCOPY;  Service: Endoscopy;  Laterality: N/A;   KNEE SURGERY      (Not in a hospital admission)  Social History   Socioeconomic History   Marital status: Single    Spouse name: Not on file   Number of children: Not on file   Years of education: Not on file   Highest education level: Not on file  Occupational History   Not on file  Tobacco Use   Smoking status: Never   Smokeless tobacco: Never  Substance and Sexual Activity   Alcohol use: Not Currently    Alcohol/week: 2.0 standard drinks of alcohol    Types: 2 Cans of beer per week    Comment: occ   Drug use: No   Sexual activity: Not on file  Other Topics Concern   Not on file  Social History Narrative   Not on file   Social Determinants  of Health   Financial Resource Strain: Not on file  Food Insecurity: No Food Insecurity (10/07/2022)   Hunger Vital Sign    Worried About Running Out of Food in the Last Year: Never true    Ran Out of Food in the Last Year: Never true  Transportation Needs: No Transportation Needs (10/07/2022)   PRAPARE - Administrator, Civil Service (Medical): No    Lack of Transportation (Non-Medical): No  Physical Activity: Not on file  Stress: Not on file  Social Connections: Not on file  Intimate Partner Violence: Not At Risk  (10/07/2022)   Humiliation, Afraid, Rape, and Kick questionnaire    Fear of Current or Ex-Partner: No    Emotionally Abused: No    Physically Abused: No    Sexually Abused: No    Family History  Problem Relation Age of Onset   Diabetes Mellitus II Mother      Vitals:   04/21/23 1007 04/21/23 1008  BP:  137/86  Pulse:  93  Resp: 20   Temp: 97.9 F (36.6 C)   TempSrc: Oral   SpO2:  91%  Weight: 101.6 kg   Height: 5\' 4"  (1.626 m)     PHYSICAL EXAM General: Well appearing male, well nourished, in no acute distress sitting upright in ED stretcher. HEENT: Normocephalic and atraumatic. Neck: No JVD.  Lungs: Normal respiratory effort on room air. Clear bilaterally to auscultation. No wheezes, crackles, rhonchi.  Heart: HRRR. Normal S1 and S2 without gallops. + murmur.  Abdomen: Non-distended appearing.  Msk: Normal strength and tone for age. Extremities: Warm and well perfused. No clubbing, cyanosis. No edema.  Neuro: Alert and oriented X 3. Psych: Answers questions appropriately.   Labs: Basic Metabolic Panel: Recent Labs    04/21/23 1009  NA 137  K 3.7  CL 103  CO2 22  GLUCOSE 273*  BUN 16  CREATININE 0.73  CALCIUM 9.1   Liver Function Tests: No results for input(s): "AST", "ALT", "ALKPHOS", "BILITOT", "PROT", "ALBUMIN" in the last 72 hours. No results for input(s): "LIPASE", "AMYLASE" in the last 72 hours. CBC: Recent Labs    04/21/23 1009  WBC 8.2  NEUTROABS 5.7  HGB 14.9  HCT 45.4  MCV 93.0  PLT 120*   Cardiac Enzymes: Recent Labs    04/21/23 1009 04/21/23 1225  TROPONINIHS 26* 29*   BNP: Recent Labs    04/21/23 1008  BNP 548.4*   D-Dimer: No results for input(s): "DDIMER" in the last 72 hours. Hemoglobin A1C: No results for input(s): "HGBA1C" in the last 72 hours. Fasting Lipid Panel: No results for input(s): "CHOL", "HDL", "LDLCALC", "TRIG", "CHOLHDL", "LDLDIRECT" in the last 72 hours. Thyroid Function Tests: No results for input(s):  "TSH", "T4TOTAL", "T3FREE", "THYROIDAB" in the last 72 hours.  Invalid input(s): "FREET3" Anemia Panel: No results for input(s): "VITAMINB12", "FOLATE", "FERRITIN", "TIBC", "IRON", "RETICCTPCT" in the last 72 hours.   Radiology: CT Angio Chest PE W/Cm &/Or Wo Cm  Result Date: 04/21/2023 CLINICAL DATA:  One-week history of shortness of breath EXAM: CT ANGIOGRAPHY CHEST WITH CONTRAST TECHNIQUE: Multidetector CT imaging of the chest was performed using the standard protocol during bolus administration of intravenous contrast. Multiplanar CT image reconstructions and MIPs were obtained to evaluate the vascular anatomy. RADIATION DOSE REDUCTION: This exam was performed according to the departmental dose-optimization program which includes automated exposure control, adjustment of the mA and/or kV according to patient size and/or use of iterative reconstruction technique. CONTRAST:  80mL OMNIPAQUE IOHEXOL  350 MG/ML SOLN COMPARISON:  Same day chest radiograph, CT abdomen and pelvis dated 08/13/2020, CT chest dated 10/04/2006 FINDINGS: Cardiovascular: The study is high quality for the evaluation of pulmonary embolism. There are no filling defects in the central, lobar, segmental or subsegmental pulmonary artery branches to suggest acute pulmonary embolism. Main pulmonary artery measures 4.1 cm. Mild multichamber cardiomegaly. Postsurgical changes from aortic and mitral valve replacement and tricuspid valve annuloplasty. No significant pericardial fluid/thickening. Reflux of contrast material into the hepatic veins, suggesting a degree of right heart dysfunction. Coronary artery calcifications and aortic atherosclerosis. Mediastinum/Nodes: Imaged thyroid gland without nodules meeting criteria for imaging follow-up by size. Normal esophagus. 13 mm anterior paratracheal lymph node (4:57). 11 mm right hilar lymph node (4:69). Lungs/Pleura: The central airways are patent. Moderate diffuse bronchial wall thickening.  Subsegmental middle lobe and lingular atelectasis. Slight heterogeneous attenuation of the medial lingular density. Bilateral lower lobe relaxation atelectasis. Heterogeneous ground-glass density adjacent to the pleural effusions in the lower lobes. No pneumothorax. Moderate right and small left pleural effusions. Upper abdomen: Normal. Musculoskeletal: No acute or abnormal lytic or blastic osseous lesions. Median sternotomy wires are nondisplaced. Multilevel degenerative changes of the thoracic spine. Mild T12 anterior wedging, similar to 08/13/2020. Review of the MIP images confirms the above findings. IMPRESSION: 1. No evidence of pulmonary embolism. 2. Moderate right and small left pleural effusions with adjacent relaxation atelectasis. Heterogeneous ground-glass density adjacent to the pleural effusions in the lower lobes, likely pulmonary edema. 3. Slight heterogeneous attenuation of the medial lingular density, which may represent atelectasis or infection. 4. Mild multichamber cardiomegaly with reflux of contrast material into the hepatic veins, suggesting a degree of right heart dysfunction. 5. Enlarged main pulmonary artery, which can be seen in the setting of pulmonary hypertension. 6. Mildly enlarged mediastinal and right hilar lymph nodes, likely reactive. 7. Aortic Atherosclerosis (ICD10-I70.0). Coronary artery calcifications. Assessment for potential risk factor modification, dietary therapy or pharmacologic therapy may be warranted, if clinically indicated. Electronically Signed   By: Agustin Cree M.D.   On: 04/21/2023 14:09   DG Chest Port 1 View  Result Date: 04/21/2023 CLINICAL DATA:  Shortness of breath. EXAM: PORTABLE CHEST 1 VIEW COMPARISON:  07/23/2020. FINDINGS: There are probable new bilateral small layering pleural effusions, left-greater-than-right. There is mild pulmonary vascular congestion without overt pulmonary edema. Bilateral lung fields are otherwise clear. No acute consolidation  or lung collapse. No pneumothorax. Stable cardio-mediastinal silhouette. There are surgical staples along the heart border and sternotomy wires, status post CABG (coronary artery bypass graft). Prosthetic aortic and mitral valves again seen. No acute osseous abnormalities. The soft tissues are within normal limits. IMPRESSION: *Mild pulmonary vascular congestion and probable bilateral layering pleural effusions. Electronically Signed   By: Jules Schick M.D.   On: 04/21/2023 10:36    ECHO ordered  TELEMETRY reviewed by me 04/21/2023: sinus rhythm rate 70s  EKG reviewed by me: NSR rate 79 bpm, PACs, nonspecific ST-T changes  Data reviewed by me 04/21/2023: last 24h vitals tele labs imaging I/O ED provider note, admission H&P  Active Problems:   * No active hospital problems. *    ASSESSMENT AND PLAN:  Lecil Moffa is a 62 y.o. 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 Cardiology was consulted for further evaluation.   # Acute HFpEF #  Hx bioprosthetic MVR, AVR, TV repair 2/2 enterococcus endocarditis 2018 # Hypertension Patient with worsening DOE x2 weeks. BNP elevated at 548, troponins 26 > 29. EKG with NSR, nonspecific ST-T changes. CXR with mild pulmonary vascular congestion. CTA chest without evidence of PE.  -S/p IV lasix 80 mg x1 in the ED.  -Echo ordered.  -Continue home losartan 25 mg daily.   # Hx paroxysmal atrial flutter s/p ablation 2018 With no reported recurrence. No palpitations. NSR on tele.  -Continue to monitor for recurrence  ADDENDUM: Echo reviewed by Dr. [name], showed EF 45-50% and no significant valvular abnormalities. Patient feeling better, SOB improved. Ok to discharge from ED, plan for follow up with Dr. [name] later this week.  This patient's  plan of care was discussed and created with Dr. [name] and he is in agreement.  Signed: Gale Journey, PA-C  04/21/2023, 3:00 PM [facility] Cardiology

## 2023-04-21 NOTE — ED Provider Triage Note (Signed)
Emergency Medicine Provider Triage Evaluation Note  Joseph Raymond , a 62 y.o. male  was evaluated in triage.  Pt complains of SOB when walking for one week. PMH of valve replacements.   Review of Systems  Positive: SOB Negative: CP, fever  Physical Exam  There were no vitals taken for this visit. Gen:   Awake, no distress   Resp:  Normal effort  MSK:   Moves extremities without difficulty  Other:    Medical Decision Making  Medically screening exam initiated at 10:06 AM.  Appropriate orders placed.  Joseph Raymond was informed that the remainder of the evaluation will be completed by another provider, this initial triage assessment does not replace that evaluation, and the importance of remaining in the ED until their evaluation is complete.     Cameron Ali, PA-C 04/21/23 1009

## 2023-04-21 NOTE — ED Notes (Addendum)
EKG shown to MD Cyril Loosen was told by MD that Pt needed a room. Charge RN Herbert Seta informed and patient taken to ED room 15.This EDT hooked patient to 12 lead monitor and informed primary RN Misty Stanley.

## 2023-04-21 NOTE — ED Triage Notes (Signed)
Patient to ED via POV for SOB- especially when walking. Ongoing x1 week. Hx of valve replacement. Denies CP.

## 2023-04-21 NOTE — ED Provider Notes (Signed)
-----------------------------------------   3:18 PM on 04/21/2023 -----------------------------------------  Blood pressure 137/86, pulse 93, temperature 97.9 F (36.6 C), temperature source Oral, resp. rate 20, height 5\' 4"  (1.626 m), weight 101.6 kg, SpO2 91%.  Assuming care from Dr. Modesto Charon.  In short, Joseph Raymond is a 62 y.o. male with a chief complaint of Shortness of Breath .  Refer to the original H&P for additional details.  The current plan of care is to follow-up cards recs.  ----------------------------------------- 5:29 PM on 04/21/2023 ----------------------------------------- Echocardiogram reviewed by Dr. [name] and shows EF of 45 to 50% with no significant valvular abnormality.  Patient feeling much better following diuresis with no difficulty breathing on reassessment.  Cardiology recommends discharge home with outpatient follow-up, they have scheduled an appointment in 4 days for him and we will prescribe short course of Lasix.  He was counseled to return to the ED for new or worsening symptoms.  Patient agrees with plan.    Chesley Noon, MD 04/21/23 1730

## 2023-05-16 ENCOUNTER — Emergency Department (HOSPITAL_COMMUNITY): Payer: Medicare HMO

## 2023-05-16 ENCOUNTER — Other Ambulatory Visit: Payer: Self-pay

## 2023-05-16 ENCOUNTER — Inpatient Hospital Stay (HOSPITAL_COMMUNITY)
Admission: EM | Admit: 2023-05-16 | Discharge: 2023-05-22 | DRG: 286 | Disposition: A | Discharge status: Home / Self Care | Payer: Medicare HMO | Attending: Internal Medicine | Admitting: Internal Medicine

## 2023-05-16 ENCOUNTER — Encounter (HOSPITAL_COMMUNITY): Payer: Self-pay | Admitting: *Deleted

## 2023-05-16 DIAGNOSIS — I5031 Acute diastolic (congestive) heart failure: Principal | ICD-10-CM

## 2023-05-16 DIAGNOSIS — M4854XA Collapsed vertebra, not elsewhere classified, thoracic region, initial encounter for fracture: Secondary | ICD-10-CM | POA: Diagnosis present

## 2023-05-16 DIAGNOSIS — I480 Paroxysmal atrial fibrillation: Secondary | ICD-10-CM | POA: Diagnosis present

## 2023-05-16 DIAGNOSIS — E66811 Obesity, class 1: Secondary | ICD-10-CM | POA: Diagnosis present

## 2023-05-16 DIAGNOSIS — Z789 Other specified health status: Secondary | ICD-10-CM | POA: Diagnosis present

## 2023-05-16 DIAGNOSIS — I050 Rheumatic mitral stenosis: Secondary | ICD-10-CM

## 2023-05-16 DIAGNOSIS — I77819 Aortic ectasia, unspecified site: Secondary | ICD-10-CM | POA: Diagnosis present

## 2023-05-16 DIAGNOSIS — D696 Thrombocytopenia, unspecified: Secondary | ICD-10-CM | POA: Diagnosis present

## 2023-05-16 DIAGNOSIS — I959 Hypotension, unspecified: Secondary | ICD-10-CM | POA: Diagnosis not present

## 2023-05-16 DIAGNOSIS — Z952 Presence of prosthetic heart valve: Secondary | ICD-10-CM

## 2023-05-16 DIAGNOSIS — E1165 Type 2 diabetes mellitus with hyperglycemia: Secondary | ICD-10-CM | POA: Diagnosis present

## 2023-05-16 DIAGNOSIS — I700 Atherosclerosis of aorta: Secondary | ICD-10-CM | POA: Diagnosis present

## 2023-05-16 DIAGNOSIS — Z833 Family history of diabetes mellitus: Secondary | ICD-10-CM

## 2023-05-16 DIAGNOSIS — I080 Rheumatic disorders of both mitral and aortic valves: Secondary | ICD-10-CM | POA: Diagnosis present

## 2023-05-16 DIAGNOSIS — Z953 Presence of xenogenic heart valve: Secondary | ICD-10-CM

## 2023-05-16 DIAGNOSIS — I2489 Other forms of acute ischemic heart disease: Secondary | ICD-10-CM | POA: Diagnosis present

## 2023-05-16 DIAGNOSIS — Z79899 Other long term (current) drug therapy: Secondary | ICD-10-CM

## 2023-05-16 DIAGNOSIS — Z603 Acculturation difficulty: Secondary | ICD-10-CM | POA: Diagnosis present

## 2023-05-16 DIAGNOSIS — R7989 Other specified abnormal findings of blood chemistry: Secondary | ICD-10-CM | POA: Diagnosis present

## 2023-05-16 DIAGNOSIS — J90 Pleural effusion, not elsewhere classified: Secondary | ICD-10-CM

## 2023-05-16 DIAGNOSIS — I110 Hypertensive heart disease with heart failure: Secondary | ICD-10-CM | POA: Diagnosis not present

## 2023-05-16 DIAGNOSIS — D649 Anemia, unspecified: Secondary | ICD-10-CM | POA: Diagnosis present

## 2023-05-16 DIAGNOSIS — Z8719 Personal history of other diseases of the digestive system: Secondary | ICD-10-CM

## 2023-05-16 DIAGNOSIS — I350 Nonrheumatic aortic (valve) stenosis: Secondary | ICD-10-CM

## 2023-05-16 DIAGNOSIS — M545 Low back pain, unspecified: Secondary | ICD-10-CM | POA: Diagnosis not present

## 2023-05-16 DIAGNOSIS — K219 Gastro-esophageal reflux disease without esophagitis: Secondary | ICD-10-CM | POA: Diagnosis present

## 2023-05-16 DIAGNOSIS — Z8679 Personal history of other diseases of the circulatory system: Secondary | ICD-10-CM

## 2023-05-16 DIAGNOSIS — I5033 Acute on chronic diastolic (congestive) heart failure: Secondary | ICD-10-CM | POA: Diagnosis present

## 2023-05-16 DIAGNOSIS — S22000A Wedge compression fracture of unspecified thoracic vertebra, initial encounter for closed fracture: Secondary | ICD-10-CM

## 2023-05-16 DIAGNOSIS — I272 Pulmonary hypertension, unspecified: Secondary | ICD-10-CM | POA: Diagnosis present

## 2023-05-16 DIAGNOSIS — E785 Hyperlipidemia, unspecified: Secondary | ICD-10-CM | POA: Diagnosis present

## 2023-05-16 DIAGNOSIS — E1129 Type 2 diabetes mellitus with other diabetic kidney complication: Secondary | ICD-10-CM | POA: Diagnosis present

## 2023-05-16 DIAGNOSIS — I5020 Unspecified systolic (congestive) heart failure: Secondary | ICD-10-CM | POA: Diagnosis present

## 2023-05-16 DIAGNOSIS — Z792 Long term (current) use of antibiotics: Secondary | ICD-10-CM

## 2023-05-16 DIAGNOSIS — K7460 Unspecified cirrhosis of liver: Secondary | ICD-10-CM | POA: Diagnosis present

## 2023-05-16 DIAGNOSIS — G8929 Other chronic pain: Secondary | ICD-10-CM | POA: Diagnosis present

## 2023-05-16 DIAGNOSIS — I4892 Unspecified atrial flutter: Secondary | ICD-10-CM | POA: Diagnosis present

## 2023-05-16 DIAGNOSIS — Z6838 Body mass index (BMI) 38.0-38.9, adult: Secondary | ICD-10-CM

## 2023-05-16 LAB — BRAIN NATRIURETIC PEPTIDE: B Natriuretic Peptide: 553 pg/mL — ABNORMAL HIGH (ref 0.0–100.0)

## 2023-05-16 LAB — CBC
HCT: 42.8 % (ref 39.0–52.0)
Hemoglobin: 13.8 g/dL (ref 13.0–17.0)
MCH: 29.6 pg (ref 26.0–34.0)
MCHC: 32.2 g/dL (ref 30.0–36.0)
MCV: 91.6 fL (ref 80.0–100.0)
Platelets: 39 10*3/uL — ABNORMAL LOW (ref 150–400)
RBC: 4.67 MIL/uL (ref 4.22–5.81)
RDW: 12.7 % (ref 11.5–15.5)
WBC: 7.9 10*3/uL (ref 4.0–10.5)
nRBC: 0 % (ref 0.0–0.2)

## 2023-05-16 LAB — BASIC METABOLIC PANEL
Anion gap: 12 (ref 5–15)
BUN: 11 mg/dL (ref 8–23)
CO2: 26 mmol/L (ref 22–32)
Calcium: 9.2 mg/dL (ref 8.9–10.3)
Chloride: 99 mmol/L (ref 98–111)
Creatinine, Ser: 0.68 mg/dL (ref 0.61–1.24)
GFR, Estimated: >60 mL/min (ref >60)
Glucose, Bld: 382 mg/dL — ABNORMAL HIGH (ref 70–99)
Potassium: 4.2 mmol/L (ref 3.5–5.1)
Sodium: 137 mmol/L (ref 135–145)

## 2023-05-16 LAB — TROPONIN I (HIGH SENSITIVITY)
Troponin I (High Sensitivity): 31 ng/L — ABNORMAL HIGH (ref <18)
Troponin I (High Sensitivity): 34 ng/L — ABNORMAL HIGH (ref <18)

## 2023-05-16 NOTE — ED Provider Triage Note (Signed)
 Emergency Medicine Provider Triage Evaluation Note  Joseph Raymond , a 63 y.o. male  was evaluated in triage.  Pt complains of SOB/DOE.  Review of Systems  Positive: Bilateral flank pain, SOB Negative: Chest pain  Physical Exam  BP 134/77   Pulse 87   Temp 97.7 F (36.5 C)   Resp 16   Ht 5' 4 (1.626 m)   Wt 101.6 kg   SpO2 94%   BMI 38.45 kg/m  Gen:   Awake, no distress   Resp:  Normal effort Scattered rales MSK:   Moves extremities without difficulty No edema of lower extremities Other:  No CVA tenderness  Medical Decision Making  Medically screening exam initiated at 7:53 PM.  Appropriate orders placed.  Aaron Bostwick was informed that the remainder of the evaluation will be completed by another provider, this initial triage assessment does not replace that evaluation, and the importance of remaining in the ED until their evaluation is complete.  Patient with SOB/DOE for the past 4 weeks. Seen at [facility] during that time and given what sounds like diuretics, discharged home from ED and did well until the medication was done then symptoms recurred. No fever, cough, vomiting. He feels ok at rest but is significantly SOB with any activity.    Odell Balls, PA-C 05/16/23 1956

## 2023-05-16 NOTE — ED Triage Notes (Signed)
 The pt is c/o lower back pain for one week no known injury   he is also c/o sob for  4-6 weeks  no sob now

## 2023-05-17 ENCOUNTER — Emergency Department (HOSPITAL_COMMUNITY): Payer: Medicare HMO

## 2023-05-17 DIAGNOSIS — I350 Nonrheumatic aortic (valve) stenosis: Secondary | ICD-10-CM | POA: Diagnosis not present

## 2023-05-17 DIAGNOSIS — E66811 Obesity, class 1: Secondary | ICD-10-CM | POA: Diagnosis present

## 2023-05-17 DIAGNOSIS — I4892 Unspecified atrial flutter: Secondary | ICD-10-CM | POA: Diagnosis present

## 2023-05-17 DIAGNOSIS — I5020 Unspecified systolic (congestive) heart failure: Secondary | ICD-10-CM | POA: Diagnosis present

## 2023-05-17 DIAGNOSIS — I110 Hypertensive heart disease with heart failure: Secondary | ICD-10-CM | POA: Diagnosis present

## 2023-05-17 DIAGNOSIS — I5033 Acute on chronic diastolic (congestive) heart failure: Secondary | ICD-10-CM

## 2023-05-17 DIAGNOSIS — I2489 Other forms of acute ischemic heart disease: Secondary | ICD-10-CM | POA: Diagnosis present

## 2023-05-17 DIAGNOSIS — R7989 Other specified abnormal findings of blood chemistry: Secondary | ICD-10-CM | POA: Diagnosis present

## 2023-05-17 DIAGNOSIS — E785 Hyperlipidemia, unspecified: Secondary | ICD-10-CM | POA: Diagnosis present

## 2023-05-17 DIAGNOSIS — E1129 Type 2 diabetes mellitus with other diabetic kidney complication: Secondary | ICD-10-CM | POA: Diagnosis present

## 2023-05-17 DIAGNOSIS — D696 Thrombocytopenia, unspecified: Secondary | ICD-10-CM | POA: Diagnosis present

## 2023-05-17 DIAGNOSIS — M4854XA Collapsed vertebra, not elsewhere classified, thoracic region, initial encounter for fracture: Secondary | ICD-10-CM | POA: Diagnosis present

## 2023-05-17 DIAGNOSIS — Z952 Presence of prosthetic heart valve: Secondary | ICD-10-CM | POA: Diagnosis not present

## 2023-05-17 DIAGNOSIS — I509 Heart failure, unspecified: Secondary | ICD-10-CM | POA: Diagnosis not present

## 2023-05-17 DIAGNOSIS — Z954 Presence of other heart-valve replacement: Secondary | ICD-10-CM | POA: Diagnosis not present

## 2023-05-17 DIAGNOSIS — I340 Nonrheumatic mitral (valve) insufficiency: Secondary | ICD-10-CM | POA: Diagnosis not present

## 2023-05-17 DIAGNOSIS — I050 Rheumatic mitral stenosis: Secondary | ICD-10-CM | POA: Diagnosis not present

## 2023-05-17 DIAGNOSIS — Z603 Acculturation difficulty: Secondary | ICD-10-CM | POA: Diagnosis present

## 2023-05-17 DIAGNOSIS — I77819 Aortic ectasia, unspecified site: Secondary | ICD-10-CM | POA: Diagnosis present

## 2023-05-17 DIAGNOSIS — Z8679 Personal history of other diseases of the circulatory system: Secondary | ICD-10-CM

## 2023-05-17 DIAGNOSIS — K7460 Unspecified cirrhosis of liver: Secondary | ICD-10-CM | POA: Diagnosis present

## 2023-05-17 DIAGNOSIS — I080 Rheumatic disorders of both mitral and aortic valves: Secondary | ICD-10-CM | POA: Diagnosis present

## 2023-05-17 DIAGNOSIS — E119 Type 2 diabetes mellitus without complications: Secondary | ICD-10-CM | POA: Diagnosis not present

## 2023-05-17 DIAGNOSIS — K219 Gastro-esophageal reflux disease without esophagitis: Secondary | ICD-10-CM | POA: Diagnosis present

## 2023-05-17 DIAGNOSIS — M545 Low back pain, unspecified: Secondary | ICD-10-CM | POA: Diagnosis present

## 2023-05-17 DIAGNOSIS — I272 Pulmonary hypertension, unspecified: Secondary | ICD-10-CM | POA: Diagnosis present

## 2023-05-17 DIAGNOSIS — Z8719 Personal history of other diseases of the digestive system: Secondary | ICD-10-CM | POA: Diagnosis not present

## 2023-05-17 DIAGNOSIS — I342 Nonrheumatic mitral (valve) stenosis: Secondary | ICD-10-CM | POA: Diagnosis not present

## 2023-05-17 DIAGNOSIS — D649 Anemia, unspecified: Secondary | ICD-10-CM | POA: Diagnosis present

## 2023-05-17 DIAGNOSIS — Z789 Other specified health status: Secondary | ICD-10-CM | POA: Diagnosis present

## 2023-05-17 DIAGNOSIS — I480 Paroxysmal atrial fibrillation: Secondary | ICD-10-CM | POA: Diagnosis present

## 2023-05-17 DIAGNOSIS — I700 Atherosclerosis of aorta: Secondary | ICD-10-CM | POA: Diagnosis present

## 2023-05-17 DIAGNOSIS — R809 Proteinuria, unspecified: Secondary | ICD-10-CM

## 2023-05-17 DIAGNOSIS — E1165 Type 2 diabetes mellitus with hyperglycemia: Secondary | ICD-10-CM | POA: Diagnosis present

## 2023-05-17 DIAGNOSIS — Z6838 Body mass index (BMI) 38.0-38.9, adult: Secondary | ICD-10-CM | POA: Diagnosis not present

## 2023-05-17 DIAGNOSIS — Z953 Presence of xenogenic heart valve: Secondary | ICD-10-CM | POA: Diagnosis not present

## 2023-05-17 LAB — URINALYSIS, ROUTINE W REFLEX MICROSCOPIC
Bacteria, UA: NONE SEEN
Bilirubin Urine: NEGATIVE
Glucose, UA: >=500 mg/dL — AB
Hgb urine dipstick: NEGATIVE
Ketones, ur: NEGATIVE mg/dL
Leukocytes,Ua: NEGATIVE
Nitrite: NEGATIVE
Protein, ur: NEGATIVE mg/dL
Specific Gravity, Urine: 1.03 (ref 1.005–1.030)
pH: 5 (ref 5.0–8.0)

## 2023-05-17 LAB — MRSA NEXT GEN BY PCR, NASAL: MRSA by PCR Next Gen: NOT DETECTED

## 2023-05-17 LAB — GLUCOSE, CAPILLARY: Glucose-Capillary: 264 mg/dL — ABNORMAL HIGH (ref 70–99)

## 2023-05-17 LAB — PROTIME-INR
INR: 1.1 (ref 0.8–1.2)
Prothrombin Time: 14.2 s (ref 11.4–15.2)

## 2023-05-17 LAB — CBG MONITORING, ED
Glucose-Capillary: 410 mg/dL — ABNORMAL HIGH (ref 70–99)
Glucose-Capillary: 300 mg/dL — ABNORMAL HIGH (ref 70–99)

## 2023-05-17 LAB — APTT: aPTT: 27 s (ref 24–36)

## 2023-05-17 LAB — HEPATIC FUNCTION PANEL
ALT: 57 U/L — ABNORMAL HIGH (ref 0–44)
AST: 50 U/L — ABNORMAL HIGH (ref 15–41)
Albumin: 4 g/dL (ref 3.5–5.0)
Alkaline Phosphatase: 67 U/L (ref 38–126)
Bilirubin, Direct: 0.5 mg/dL — ABNORMAL HIGH (ref 0.0–0.2)
Indirect Bilirubin: 1.4 mg/dL — ABNORMAL HIGH (ref 0.3–0.9)
Total Bilirubin: 1.9 mg/dL — ABNORMAL HIGH (ref 0.0–1.2)
Total Protein: 6.9 g/dL (ref 6.5–8.1)

## 2023-05-17 LAB — T4, FREE: Free T4: 1.16 ng/dL — ABNORMAL HIGH (ref 0.61–1.12)

## 2023-05-17 LAB — HEPATITIS PANEL, ACUTE
HCV Ab: NONREACTIVE
Hep A IgM: NONREACTIVE
Hep B C IgM: NONREACTIVE
Hepatitis B Surface Ag: NONREACTIVE

## 2023-05-17 LAB — MAGNESIUM: Magnesium: 1.9 mg/dL (ref 1.7–2.4)

## 2023-05-17 LAB — TSH: TSH: 1.832 u[IU]/mL (ref 0.350–4.500)

## 2023-05-17 MED ORDER — LORAZEPAM 2 MG/ML IJ SOLN: 1.0000 mg | INTRAMUSCULAR | Status: AC | PRN

## 2023-05-17 MED ORDER — THIAMINE HCL 100 MG/ML IJ SOLN: 100.0000 mg | Freq: Every day | INTRAMUSCULAR | Status: DC

## 2023-05-17 MED ORDER — INSULIN ASPART 100 UNIT/ML IJ SOLN
0.0000 [IU] | Freq: Every day | INTRAMUSCULAR | Status: DC
  Administered 2023-05-17: 2 [IU] via SUBCUTANEOUS

## 2023-05-17 MED ORDER — INSULIN GLARGINE-YFGN 100 UNIT/ML ~~LOC~~ SOLN
10.0000 [IU] | Freq: Every day | SUBCUTANEOUS | Status: DC
Start: 2023-05-17 — End: 2023-05-19
  Administered 2023-05-17 – 2023-05-18 (×2): 10 [IU] via SUBCUTANEOUS
  Filled 2023-05-17 (×3): qty 0.1

## 2023-05-17 MED ORDER — INSULIN ASPART 100 UNIT/ML IJ SOLN
0.0000 [IU] | Freq: Three times a day (TID) | INTRAMUSCULAR | Status: DC
  Administered 2023-05-17: 3 [IU] via SUBCUTANEOUS
  Administered 2023-05-17: 9 [IU] via SUBCUTANEOUS
  Administered 2023-05-18: 5 [IU] via SUBCUTANEOUS
  Administered 2023-05-18: 7 [IU] via SUBCUTANEOUS
  Administered 2023-05-18: 3 [IU] via SUBCUTANEOUS
  Administered 2023-05-19: 2 [IU] via SUBCUTANEOUS
  Administered 2023-05-19: 5 [IU] via SUBCUTANEOUS
  Administered 2023-05-19: 2 [IU] via SUBCUTANEOUS
  Administered 2023-05-20: 3 [IU] via SUBCUTANEOUS
  Administered 2023-05-20: 5 [IU] via SUBCUTANEOUS
  Administered 2023-05-20 – 2023-05-21 (×2): 2 [IU] via SUBCUTANEOUS
  Administered 2023-05-21: 5 [IU] via SUBCUTANEOUS
  Administered 2023-05-21 – 2023-05-22 (×2): 2 [IU] via SUBCUTANEOUS
  Administered 2023-05-22: 3 [IU] via SUBCUTANEOUS

## 2023-05-17 MED ORDER — PANTOPRAZOLE SODIUM 40 MG PO TBEC
40.0000 mg | DELAYED_RELEASE_TABLET | Freq: Two times a day (BID) | ORAL | Status: DC
  Administered 2023-05-17 – 2023-05-22 (×8): 40 mg via ORAL
  Filled 2023-05-17 (×10): qty 1

## 2023-05-17 MED ORDER — ACETAMINOPHEN 325 MG PO TABS
650.0000 mg | ORAL_TABLET | ORAL | Status: DC | PRN
  Administered 2023-05-18: 650 mg via ORAL
  Filled 2023-05-17: qty 2

## 2023-05-17 MED ORDER — LOSARTAN POTASSIUM 25 MG PO TABS
25.0000 mg | ORAL_TABLET | Freq: Every day | ORAL | Status: DC
  Administered 2023-05-17: 25 mg via ORAL
  Filled 2023-05-17 (×2): qty 1

## 2023-05-17 MED ORDER — THIAMINE MONONITRATE 100 MG PO TABS
100.0000 mg | ORAL_TABLET | Freq: Every day | ORAL | Status: DC
Start: 2023-05-17 — End: 2023-05-22
  Administered 2023-05-17 – 2023-05-22 (×6): 100 mg via ORAL
  Filled 2023-05-17 (×6): qty 1

## 2023-05-17 MED ORDER — SODIUM CHLORIDE 0.9% FLUSH
3.0000 mL | Freq: Two times a day (BID) | INTRAVENOUS | Status: DC
  Administered 2023-05-17 – 2023-05-21 (×8): 3 mL via INTRAVENOUS

## 2023-05-17 MED ORDER — FOLIC ACID 1 MG PO TABS
1.0000 mg | ORAL_TABLET | Freq: Every day | ORAL | Status: DC
  Administered 2023-05-17 – 2023-05-22 (×6): 1 mg via ORAL
  Filled 2023-05-17 (×6): qty 1

## 2023-05-17 MED ORDER — FUROSEMIDE 10 MG/ML IJ SOLN
80.0000 mg | Freq: Once | INTRAMUSCULAR | Status: AC
  Administered 2023-05-17: 80 mg via INTRAVENOUS
  Filled 2023-05-17: qty 8

## 2023-05-17 MED ORDER — LORAZEPAM 1 MG PO TABS: 1.0000 mg | ORAL_TABLET | ORAL | Status: AC | PRN

## 2023-05-17 MED ORDER — GUAIFENESIN ER 600 MG PO TB12
600.0000 mg | ORAL_TABLET | Freq: Once | ORAL | Status: AC
  Administered 2023-05-17: 600 mg via ORAL
  Filled 2023-05-17: qty 1

## 2023-05-17 MED ORDER — ADULT MULTIVITAMIN W/MINERALS CH
1.0000 | ORAL_TABLET | Freq: Every day | ORAL | Status: DC
  Administered 2023-05-17 – 2023-05-22 (×6): 1 via ORAL
  Filled 2023-05-17 (×6): qty 1

## 2023-05-17 MED ORDER — ONDANSETRON HCL 4 MG/2ML IJ SOLN
4.0000 mg | Freq: Four times a day (QID) | INTRAMUSCULAR | Status: DC | PRN
Start: 2023-05-17 — End: 2023-05-22

## 2023-05-17 MED ORDER — SODIUM CHLORIDE 0.9% FLUSH: 3.0000 mL | INTRAVENOUS | Status: DC | PRN

## 2023-05-17 MED ORDER — SODIUM CHLORIDE 0.9 % IV SOLN: 250.0000 mL | INTRAVENOUS | Status: AC | PRN

## 2023-05-17 MED ORDER — LIDOCAINE 5 % EX PTCH
2.0000 | MEDICATED_PATCH | CUTANEOUS | Status: DC
  Administered 2023-05-17 – 2023-05-20 (×4): 2 via TRANSDERMAL
  Filled 2023-05-17 (×4): qty 2

## 2023-05-17 MED ORDER — AMOXICILLIN 500 MG PO CAPS
500.0000 mg | ORAL_CAPSULE | Freq: Two times a day (BID) | ORAL | Status: DC
  Administered 2023-05-17 – 2023-05-22 (×11): 500 mg via ORAL
  Filled 2023-05-17 (×11): qty 1

## 2023-05-17 MED ORDER — FUROSEMIDE 10 MG/ML IJ SOLN
40.0000 mg | Freq: Two times a day (BID) | INTRAMUSCULAR | Status: DC
Start: 2023-05-17 — End: 2023-05-18
  Administered 2023-05-17 – 2023-05-18 (×2): 40 mg via INTRAVENOUS
  Filled 2023-05-17 (×2): qty 4

## 2023-05-17 NOTE — ED Notes (Signed)
 Pt states when he is laying down he feel like he has phlegm stuck in his throat. RN provided pt water and pt received Mucinex at 0523. Dr. Katrinka Blazing notified

## 2023-05-17 NOTE — ED Notes (Signed)
 Patient ambulated with pulse ox. Patient noticeably short of breath while walking. Oxygen saturation initially 96 then dropped to 92 towards end of walk. Upon returning back to the room oxygen saturation 95-96.

## 2023-05-17 NOTE — ED Notes (Signed)
 Pt ambulated to restroom without incident.

## 2023-05-17 NOTE — ED Notes (Signed)
 Patient transported to CT

## 2023-05-17 NOTE — ED Notes (Signed)
 RN contacted Dr. Madelyn Flavors about pt CBG 410. Dr. Katrinka Blazing recommends to administer sliding scale 9 units novolog.

## 2023-05-17 NOTE — Consult Note (Signed)
 Cardiology Consultation   Patient ID: Tyreese Thain MRN: [id number]; DOB: [birth year]  Admit date: 05/16/2023 Date of Consult: 05/17/2023  PCP:  [facility]   [facility] Providers Cardiologist: [facility] - Dr. Florencio  Patient Profile:   [place] Marney is a 63 y.o. 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 Claudene.  History of Present Illness:   Mr. Baines is evaluated this afternoon by cardiology with the assistance of family members present for Spanish interpretation (Spanish interpreter interface not functioning at the time of my consultation, therefore asked family members and patient if they were willing to proceed with interview under the circumstances and they agreed).  He was evaluated at [facility] in 04/2023 and reported worsening shortness of breath for the past 2 weeks. Hs troponin values were flat at 26 and 29 but BNP was elevated at 548. A follow-up echocardiogram was obtained which showed his EF was mildly reduced at 45 to 50% with global hypokinesis. He did have moderate LVH, grade 1 diastolic dysfunction, low normal RV function, mild MR, moderate MS, moderate AI and aortic dilatation with aortic root at 42 mm. He received IV Lasix  while in the ED and no changes were made to his cardiac medications at that time. Was prescribed Lasix  for 7 days at home but not on a continual basis.  He is a patient of Dr. Florencio at [facility].  He presented to [facility] ED 05/17/2023 for evaluation of worsening shortness of breath over the past 4 weeks.  States  that he becomes fatigued quickly with exertion, has also been experiencing orthopnea, no definite leg swelling.  No exertional chest pain, no palpitations or syncope.  No reported fevers or chills.  Weight reported 214 pounds as of October 2024, now 223 pounds at presentation.  Initial labs showed WBC 7.9, Hgb 13.8, platelets 39 K (previously 120 K in 04/2023).  Na+ 137, K+ 4.2 and creatinine 0.68.  BNP elevated at 553. Initial and repeat Hs Troponin flat at 31 and 34. CXR showed mild vascular congestion. EKG shows NSR, HR 77 with PAC's and LVH and TWI along the anterior leads.   I reviewed old records from [facility], surgical valve history in 2018 includes 25 mm Inspiris bovine pericardial bioprosthetic valve, 29 mm Magna Mitral Ease bovine bioprosthetic valve, and 30 mm Carpentier Edwards Physio ring.  Past Medical History:  Diagnosis Date   Chronic back pain    Diabetes mellitus without complication (HCC)    Hypertension    Lumbar radiculopathy     Past Surgical History:  Procedure Laterality Date   ESOPHAGOGASTRODUODENOSCOPY N/A 10/01/2019   Procedure: ESOPHAGOGASTRODUODENOSCOPY (EGD);  Surgeon: Toledo, Ladell POUR, MD;  Location: [facility] ENDOSCOPY;  Service: Gastroenterology;  Laterality: N/A;   ESOPHAGOGASTRODUODENOSCOPY (EGD) WITH PROPOFOL  N/A 10/08/2022   Procedure: ESOPHAGOGASTRODUODENOSCOPY (EGD) WITH PROPOFOL ;  Surgeon: Jinny Carmine, MD;  Location: [facility] ENDOSCOPY;  Service: Endoscopy;  Laterality: N/A;   KNEE SURGERY       Home Medications:  Prior to Admission medications   Medication Sig Start Date End Date Taking? Authorizing Provider  amoxicillin  (AMOXIL ) 500 MG capsule  Take 1 capsule (500 mg total) by mouth 2 (two) times daily. 04/21/23 07/20/23  Cyrena Mylar, MD  ferrous sulfate  325 (65 FE) MG EC tablet Take 1 tablet (325 mg total) by mouth daily with breakfast. 10/10/22 01/08/23  Amin, Burgess C, MD  furosemide  (LASIX ) 20 MG tablet Take 1 tablet (20 mg total) by mouth daily for 7  days. 04/21/23 04/28/23  Willo Dunnings, MD  losartan  (COZAAR ) 25 MG tablet Take 25 mg by mouth daily.    [provider]  pantoprazole  (PROTONIX ) 40 MG tablet Take 1 tablet (40 mg total) by mouth 2 (two) times daily before a meal. 10/10/22   Amin, Ankit C, MD  senna-docusate (SENOKOT-S) 8.6-50 MG tablet Take 1-2 tablets by mouth at bedtime as needed for moderate constipation or mild constipation. 10/10/22   Amin, Ankit C, MD  terbinafine (LAMISIL) 250 MG tablet Take 250 mg by mouth daily.    [provider]    Inpatient Medications: Scheduled Meds:  amoxicillin   500 mg Oral BID   folic acid   1 mg Oral Daily   furosemide   40 mg Intravenous BID   insulin  aspart  0-5 Units Subcutaneous QHS   insulin  aspart  0-9 Units Subcutaneous TID WC   insulin  glargine-yfgn  10 Units Subcutaneous Daily   lidocaine   2 patch Transdermal Q24H   losartan   25 mg Oral Daily   multivitamin with minerals  1 tablet Oral Daily   pantoprazole   40 mg Oral BID AC   sodium chloride  flush  3 mL Intravenous Q12H   thiamine   100 mg Oral Daily   Or   thiamine   100 mg Intravenous Daily   Continuous Infusions:  sodium chloride      PRN Meds: sodium chloride , acetaminophen , LORazepam  **OR** LORazepam , ondansetron  (ZOFRAN ) IV, sodium chloride  flush  Allergies:   No Known Allergies  Social History:   Social History   Tobacco Use   Smoking status: Never   Smokeless tobacco: Never  Substance Use Topics   Alcohol use: Not Currently    Alcohol/week: 2.0 standard drinks of alcohol    Types: 2 Cans of beer per week    Comment: occ     Family History:    Family History  Problem Relation Age of Onset   Diabetes Mellitus II Mother      ROS:  Please see the history of present illness. All other ROS reviewed and negative.     Physical Exam/Data:   Vitals:   05/17/23 1430 05/17/23 1439 05/17/23 1500 05/17/23 1519  BP: 101/77     Pulse: 70   81  Resp: 19     Temp:  98.4 F (36.9 C)  98.1  F (36.7 C)  TempSrc:  Oral  Oral  SpO2: 94%   93%  Weight:   92.2 kg   Height:        Intake/Output Summary (Last 24 hours) at 05/17/2023 1600 Last data filed at 05/17/2023 1248 Gross per 24 hour  Intake --  Output 2100 ml  Net -2100 ml      05/17/2023    3:00 PM 05/16/2023    7:14 PM 04/21/2023   10:07 AM  Last 3 Weights  Weight (lbs) 203 lb 4.2 oz 223 lb 15.8 oz 224 lb  Weight (kg) 92.2 kg 101.6 kg 101.606 kg     Body mass index is 34.89 kg/m.  General: Overweight male in no acute distress. HEENT: Conjunctiva and lids normal, oropharynx clear. Neck: Supple, difficult to assess  JVP. Lungs: Scattered rhonchi, no wheezing or labored breathing at rest.  Decreased breath sounds at the bases. Cardiac: Regular rate and rhythm, no S3, 2/6 systolic murmur, do not appreciate diastolic rumble in the setting of ambient room sounds. Abdomen: Protuberant, bowel sounds present. Extremities: Trace to 1+ lower leg edema, distal pulses 2+. Skin: Warm and dry. Musculoskeletal: No kyphosis. Neuropsychiatric: Alert and oriented x3, affect grossly appropriate.   EKG: ECG from yesterday shows sinus rhythm with LVH and repolarization abnormalities, left posterior fascicular block, PAC.  Anteroseptal ST-T wave abnormalities are old.  Telemetry: Telemetry reviewed showing sinus rhythm.  Relevant CV Studies:  Cardiac Catheterization: 11/2016 Findings:  1. No significant coronary artery disease   Recommendations:  1. Per primary team. (CICU)   NST: 08/2022 Normal myocardial perfusion scan no evidence of stress-induced  myocardial ischemia ejection fraction is 60% conclusion negative scan this  is a lower study   Echocardiogram: 08/2022 INTERPRETATION  NORMAL LEFT VENTRICULAR SYSTOLIC FUNCTION   WITH MODERATE LVH  NORMAL RIGHT VENTRICULAR SYSTOLIC FUNCTION  MILD VALVULAR REGURGITATION (See above)  NO VALVULAR STENOSIS  ESTIMATED LVEF >55%  CALCULATED: 58.7%  GLS: -15.8%  AoV:  BIOPROSTHETIC VALVE APPEARS WELL-SUITED AND FULLY FUNCTIONAL  AORTIC GRADIENTS: 3.107m/s; AVA: 1.4cm^2  (prev. echo: 3.25m/s: AVA: 1.5cm^2)  MV: BIOPROSTHETIC VALVE  MILD-MODERATE MR  MODERATE LAE   Echocardiogram: 04/2023 IMPRESSIONS    1. Left ventricular ejection fraction, by estimation, is 45 to 50%. The  left ventricle has mildly decreased function. The left ventricle  demonstrates global hypokinesis. There is moderate concentric left  ventricular hypertrophy. Left ventricular  diastolic parameters are consistent with Grade I diastolic dysfunction  (impaired relaxation).   2. Right ventricular systolic function is low normal. The right  ventricular size is mildly enlarged.   3. Left atrial size was mildly dilated.   4. Right atrial size was mildly dilated.   5. The mitral valve is myxomatous. Mild mitral valve regurgitation.  Moderate mitral stenosis. Moderate mitral annular calcification.   6. The aortic valve is tricuspid. Aortic valve regurgitation is moderate.  Aortic valve sclerosis/calcification is present, without any evidence of  aortic stenosis.   7. Aortic dilatation noted. There is mild dilatation of the aortic root,  measuring 42 mm.   Laboratory Data:  High Sensitivity Troponin:   Recent Labs  Lab 04/21/23 1009 04/21/23 1225 05/16/23 1932 05/16/23 2134  TROPONINIHS 26* 29* 31* 34*     Chemistry Recent Labs  Lab 05/16/23 1932 05/17/23 0801  NA 137  --   K 4.2  --   CL 99  --   CO2 26  --   GLUCOSE 382*  --   BUN 11  --   CREATININE 0.68  --   CALCIUM  9.2  --   MG  --  1.9  GFRNONAA >60  --   ANIONGAP 12  --     Recent Labs  Lab 05/17/23 0801  PROT 6.9  ALBUMIN 4.0  AST 50*  ALT 57*  ALKPHOS 67  BILITOT 1.9*    Hematology Recent Labs  Lab 05/16/23 1932  WBC 7.9  RBC 4.67  HGB 13.8  HCT 42.8  MCV 91.6  MCH 29.6  MCHC 32.2  RDW 12.7  PLT 39*   Thyroid  Recent Labs  Lab 05/17/23 0801  TSH 1.832  FREET4 1.16*     BNP Recent Labs  Lab 05/16/23 1932  BNP 553.0*     Radiology/Studies:  CT Renal Stone Study Result Date:  05/17/2023 CLINICAL DATA:  63 year old male with history of abdominal and flank pain. EXAM: CT ABDOMEN AND PELVIS WITHOUT CONTRAST TECHNIQUE: Multidetector CT imaging of the abdomen and pelvis was performed following the standard protocol without IV contrast. RADIATION DOSE REDUCTION: This exam was performed according to the departmental dose-optimization program which includes automated exposure control, adjustment of the mA and/or kV according to patient size and/or use of iterative reconstruction technique. COMPARISON:  CT of the abdomen and pelvis 08/13/2020. FINDINGS: Lower chest: Moderate right and small left pleural effusions lie dependently. Areas of dependent atelectasis are noted in the lower lobes of the lungs bilaterally. Postoperative changes of tricuspid annuloplasty and mitral valve replacement (stented bioprosthesis) are noted. Hepatobiliary: Liver has a shrunken appearance and nodular contour, suggesting underlying cirrhosis. No discrete cystic or solid hepatic lesions are confidently identified on today's noncontrast CT examination. Unenhanced appearance of the gallbladder is unremarkable. Pancreas: No definite pancreatic mass or peripancreatic fluid collections or inflammatory changes are noted on today's noncontrast CT examination. Spleen: Unremarkable. Adrenals/Urinary Tract: There are no abnormal calcifications within the collecting system of either kidney, along the course of either ureter, or within the lumen of the urinary bladder. No hydroureteronephrosis or perinephric stranding to suggest urinary tract obstruction at this time. The unenhanced appearance of the kidneys is unremarkable bilaterally. Unenhanced appearance of the urinary bladder is normal. Bilateral adrenal glands are normal in appearance. Stomach/Bowel: Unenhanced appearance of the stomach is normal. There is no  pathologic dilatation of small bowel or colon. Normal appendix. Vascular/Lymphatic: Atherosclerosis throughout the abdominal aorta and pelvic vasculature. No lymphadenopathy noted in the abdomen or pelvis. Reproductive: Prostate gland is mildly enlarged measuring 6.6 x 4.8 cm. Other: No significant volume of ascites.  No pneumoperitoneum. Musculoskeletal: Chronic appearing compression fracture of T12 with 25% loss of anterior vertebral body height. Multilevel degenerative disc disease, most severe at L4-L5 where there is complete loss of intervertebral disc height and partial bony fusion. There are no aggressive appearing lytic or blastic lesions noted in the visualized portions of the skeleton. IMPRESSION: 1. No acute findings are noted in the abdomen or pelvis to account for the patient's symptoms. 2. Morphologic changes in the liver suggesting underlying cirrhosis. 3. Moderate right and small left pleural effusions lying dependently. 4. Prostatomegaly. 5. Aortic atherosclerosis. 6. Additional incidental findings, as above. Electronically Signed   By: Toribio Aye M.D.   On: 05/17/2023 06:13   DG Chest 2 View Result Date: 05/16/2023 CLINICAL DATA:  Shortness of breath EXAM: CHEST - 2 VIEW COMPARISON:  04/21/2019 FINDINGS: Cardiac shadow is stable. Postsurgical changes are again seen. Mild vascular congestion is noted. Small left pleural effusion is noted. No acute bony abnormality is seen. Chronic lower thoracic compression deformity is seen. IMPRESSION: Mild vascular congestion.  No other focal abnormality is noted. Electronically Signed   By: Oneil Devonshire M.D.   On: 05/16/2023 21:19    Assessment and Plan:   1.  Progressive dyspnea on exertion and fatigue over the last month as outlined above.  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  I reviewed extensive records including follow-up by Dr. Florencio and recent interval cardiac testing.  I  personally reviewed the echocardiogram performed at [facility] in December.  He does not have LV dysfunction, LVEF is 65-70% range with severe LVH.  Mitral prosthesis is very difficult to define structurally with substantial thickening, difficult to appreciate valve leaflet motion.  The mean mitral gradient of 21 mmHg  has increased substantially and suggests severe mitral stenosis which could certainly be contributing to his symptoms.  He does have RV dilatation and mild dysfunction as well.  Aortic prosthesis is mildly thickened with mean AV gradient 17 mmHg and mild aortic regurgitation (dimensionless index 0.44 suggest there could be mild prosthetic AV stenosis).  Tricuspid annuloplasty appears to be intact.  2.  History of endocarditis in 2018 ultimately status post multivalve surgery at [facility] including 25 mm Inspiris bovine pericardial bioprosthetic valve, 29 mm Magna Mitral Ease bovine bioprosthetic valve, and 30 mm Carpentier Edwards Physio ring.  3.  History of atrial fibrillation and flutter status post flutter ablation at [facility] in 2018.  He is not anticoagulated with history of GI bleeding due to AVMs.  4.  Primary hypertension.  5.  Type 2 diabetes mellitus.  6.  Thrombocytopenia, platelet count 39.  Looks to be fairly chronic per chart review, but progressive.  Platelet count was 120 in December 2024.  7.  Morphologic changes in the liver suggesting cirrhosis per CT imaging.  Patient currently on Lasix  40 mg IV twice daily per primary team, also Cozaar .  Would follow-up urine output and subsequent renal function in AM.  Go ahead and repeat transthoracic echocardiogram, but he needs a TEE ultimately to further evaluate valvular status.  Need to clarify thrombocytopenia trend first as this will increase bleeding risk in current range.  For questions or updates, please contact [facility] Please consult [email or web address] for contact info under    Signed, Jayson JUDITHANN Sierras, M.D., F.A.C.C.

## 2023-05-17 NOTE — ED Notes (Signed)
 Breakfast tray delivered to room

## 2023-05-17 NOTE — ED Provider Notes (Signed)
 Joseph Raymond Provider Note   CSN: [id number] Arrival date & time: 05/16/23  8161     History  Chief Complaint  Patient presents with   Back Pain   Shortness of Breath    Joseph Raymond is a 63 y.o. male.  The history is provided by the patient. The history is limited by a language barrier. A language interpreter was used (spanish- verneita [phone]).  Patient reports increasing dyspnea on exertion for 4 weeks He reports it is worsening No fever/vomiting No chest pain He reports cough but no hemoptysis He is a nonsmoker No new LE edema He reports orthopnea He reports med compliance He reports he is no longer on anticoagulation He is not on lasix , completed a course for 7 days   He  also reports low back pain for 1.5 weeks No falls/trauma No LE weakness No incontinence No back surgery  Home Medications Prior to Admission medications   Medication Sig Start Date End Date Taking? Authorizing Provider  amoxicillin  (AMOXIL ) 500 MG capsule Take 1 capsule (500 mg total) by mouth 2 (two) times daily. 04/21/23 07/20/23  Cyrena Mylar, MD  ferrous sulfate  325 (65 FE) MG EC tablet Take 1 tablet (325 mg total) by mouth daily with breakfast. 10/10/22 01/08/23  Amin, Burgess BROCKS, MD  furosemide  (LASIX ) 20 MG tablet Take 1 tablet (20 mg total) by mouth daily for 7 days. 04/21/23 04/28/23  Willo Dunnings, MD  losartan  (COZAAR ) 25 MG tablet Take 25 mg by mouth daily.    [provider]  pantoprazole  (PROTONIX ) 40 MG tablet Take 1 tablet (40 mg total) by mouth 2 (two) times daily before a meal. 10/10/22   Amin, Ankit C, MD  senna-docusate (SENOKOT-S) 8.6-50 MG tablet Take 1-2 tablets by mouth at bedtime as needed for moderate constipation or mild constipation. 10/10/22   Amin, Ankit C, MD  terbinafine (LAMISIL) 250 MG tablet Take 250 mg by mouth daily.    [provider]      Allergies    Patient has no known allergies.    Review of  Systems   Review of Systems  Constitutional:  Negative for fever.  Respiratory:  Positive for cough and shortness of breath.   Cardiovascular:  Negative for chest pain and leg swelling.  Gastrointestinal:  Negative for blood in stool and vomiting.  Genitourinary:  Negative for dysuria.  Neurological:  Negative for weakness.    Physical Exam Updated Vital Signs BP 105/88   Pulse 73   Temp 98.2 F (36.8 C) (Oral)   Resp 17   Ht 1.626 m (5' 4)   Wt 101.6 kg   SpO2 92%   BMI 38.45 kg/m  Physical Exam CONSTITUTIONAL: Elderly HEAD: Normocephalic/atraumatic EYES: EOMI/PERRL ENMT: Mucous membranes moist NECK: supple no meningeal signs, minimal JVD SPINE/BACK:entire spine nontender CV: S1/S2 noted, murmur noted LUNGS: Bibasilar crackles, tachypneic ABDOMEN: soft, nontender, no rebound or guarding, bowel sounds noted throughout abdomen GU: Bilateral cva tenderness NEURO: Pt is awake/alert/appropriate, moves all extremitiesx4.  No facial droop.  Patient able to ambulate.  Appropriate strength noted bilateral lower extremity EXTREMITIES: pulses normal/equal, full ROM,, minimal lower extremity edema SKIN: warm, color normal PSYCH: no abnormalities of mood noted, alert and oriented to situation  ED Results / Procedures / Treatments   Labs (all labs ordered are listed, but only abnormal results are displayed) Labs Reviewed  BASIC METABOLIC PANEL - Abnormal; Notable for the following components:      Result  Value   Glucose, Bld 382 (*)    All other components within normal limits  CBC - Abnormal; Notable for the following components:   Platelets 39 (*)    All other components within normal limits  URINALYSIS, ROUTINE W REFLEX MICROSCOPIC - Abnormal; Notable for the following components:   Glucose, UA >=500 (*)    All other components within normal limits  BRAIN NATRIURETIC PEPTIDE - Abnormal; Notable for the following components:   B Natriuretic Peptide 553.0 (*)    All other  components within normal limits  TROPONIN I (HIGH SENSITIVITY) - Abnormal; Notable for the following components:   Troponin I (High Sensitivity) 31 (*)    All other components within normal limits  TROPONIN I (HIGH SENSITIVITY) - Abnormal; Notable for the following components:   Troponin I (High Sensitivity) 34 (*)    All other components within normal limits    EKG EKG Interpretation Date/Time:  Friday May 16 2023 19:23:55 EST Ventricular Rate:  77 PR Interval:  144 QRS Duration:  92 QT Interval:  446 QTC Calculation: 504 R Axis:   121  Text Interpretation: Sinus rhythm with Premature atrial complexes Left posterior fascicular block Minimal voltage criteria for LVH, may be normal variant ( Cornell product ) Abnormal ECG Interpretation limited secondary to artifact Confirmed by Midge Golas ([id number]) on 05/17/2023 4:13:12 AM  Radiology CT Renal Stone Study Result Date: 05/17/2023 CLINICAL DATA:  64 year old male with history of abdominal and flank pain. EXAM: CT ABDOMEN AND PELVIS WITHOUT CONTRAST TECHNIQUE: Multidetector CT imaging of the abdomen and pelvis was performed following the standard protocol without IV contrast. RADIATION DOSE REDUCTION: This exam was performed according to the departmental dose-optimization program which includes automated exposure control, adjustment of the mA and/or kV according to patient size and/or use of iterative reconstruction technique. COMPARISON:  CT of the abdomen and pelvis 08/13/2020. FINDINGS: Lower chest: Moderate right and small left pleural effusions lie dependently. Areas of dependent atelectasis are noted in the lower lobes of the lungs bilaterally. Postoperative changes of tricuspid annuloplasty and mitral valve replacement (stented bioprosthesis) are noted. Hepatobiliary: Liver has a shrunken appearance and nodular contour, suggesting underlying cirrhosis. No discrete cystic or solid hepatic lesions are confidently identified on today's  noncontrast CT examination. Unenhanced appearance of the gallbladder is unremarkable. Pancreas: No definite pancreatic mass or peripancreatic fluid collections or inflammatory changes are noted on today's noncontrast CT examination. Spleen: Unremarkable. Adrenals/Urinary Tract: There are no abnormal calcifications within the collecting system of either kidney, along the course of either ureter, or within the lumen of the urinary bladder. No hydroureteronephrosis or perinephric stranding to suggest urinary tract obstruction at this time. The unenhanced appearance of the kidneys is unremarkable bilaterally. Unenhanced appearance of the urinary bladder is normal. Bilateral adrenal glands are normal in appearance. Stomach/Bowel: Unenhanced appearance of the stomach is normal. There is no pathologic dilatation of small bowel or colon. Normal appendix. Vascular/Lymphatic: Atherosclerosis throughout the abdominal aorta and pelvic vasculature. No lymphadenopathy noted in the abdomen or pelvis. Reproductive: Prostate gland is mildly enlarged measuring 6.6 x 4.8 cm. Other: No significant volume of ascites.  No pneumoperitoneum. Musculoskeletal: Chronic appearing compression fracture of T12 with 25% loss of anterior vertebral body height. Multilevel degenerative disc disease, most severe at L4-L5 where there is complete loss of intervertebral disc height and partial bony fusion. There are no aggressive appearing lytic or blastic lesions noted in the visualized portions of the skeleton. IMPRESSION: 1. No acute findings are noted in  the abdomen or pelvis to account for the patient's symptoms. 2. Morphologic changes in the liver suggesting underlying cirrhosis. 3. Moderate right and small left pleural effusions lying dependently. 4. Prostatomegaly. 5. Aortic atherosclerosis. 6. Additional incidental findings, as above. Electronically Signed   By: Toribio Aye M.D.   On: 05/17/2023 06:13   DG Chest 2 View Result Date:  05/16/2023 CLINICAL DATA:  Shortness of breath EXAM: CHEST - 2 VIEW COMPARISON:  04/21/2019 FINDINGS: Cardiac shadow is stable. Postsurgical changes are again seen. Mild vascular congestion is noted. Small left pleural effusion is noted. No acute bony abnormality is seen. Chronic lower thoracic compression deformity is seen. IMPRESSION: Mild vascular congestion.  No other focal abnormality is noted. Electronically Signed   By: Oneil Devonshire M.D.   On: 05/16/2023 21:19    Procedures Procedures    Medications Ordered in ED Medications  furosemide  (LASIX ) injection 80 mg (80 mg Intravenous Given 05/17/23 0450)  guaiFENesin  (MUCINEX ) 12 hr tablet 600 mg (600 mg Oral Given 05/17/23 0522)    ED Course/ Medical Decision Making/ A&P Clinical Course as of 05/17/23 [id number]  Sat May 17, 2023  0413 Glucose(!): 382 Hyperglycemia [DW]  0509 Platelets(!): 39 Thrombocytopenia [DW]  0510 Patient with extensive history including previous bacterial endocarditis with valvular repair, atrial flutter, recent echocardiogram that revealed CHF presents with increasing dyspnea exertion the past several weeks.  He was seen in the ER last month and given Lasix  for a week, but since that time his symptoms have worsened. He also reports onset of bilateral flank pain recently but no trauma, no weakness or incontinence.  Will diuresis here in the ER, get CT imaging. [DW]  0510 Chronic patient also found to have acute on chronic thrombocytopenia [DW]  0603 Patient with appropriate diuresis, but still tachypneic.  Patient would benefit from admission due to recurrence of his symptoms and was only on 7 days of Lasix  last time.  He would benefit from continued diuresis and potential cardiology consultation. [DW]  0630 Endorsed Dr. Lonzell for admission to hospitalist. CT imaging reveals multiple incidental findings, nothing acute [DW]    Clinical Course User Index [DW] Midge Golas, MD                                  Medical Decision Making Amount and/or Complexity of Data Reviewed Labs:  Decision-making details documented in ED Course. Radiology: ordered.  Risk OTC drugs. Prescription drug management. Decision regarding hospitalization.   This patient presents to the ED for concern of shortness of breath, this involves an extensive number of treatment options, and is a complaint that carries with it a high risk of complications and morbidity.  The differential diagnosis includes but is not limited to Acute coronary syndrome, pneumonia, acute pulmonary edema, pneumothorax, acute anemia, pulmonary embolism    Comorbidities that complicate the patient evaluation: Patient's presentation is complicated by their history of aortic valve replacement, CHF  Social Determinants of Health: Patient's  limited English proficiency   increases the complexity of managing their presentation  Additional history obtained: Additional history obtained from family Records reviewed previous admission documents  Lab Tests: I Ordered, and personally interpreted labs.  The pertinent results include: Thrombocytopenia, hyperglycemia, elevated BNP  Imaging Studies ordered: I ordered imaging studies including X-ray chest   I independently visualized and interpreted imaging which showed vascular congestion I agree with the radiologist interpretation  Cardiac Monitoring: The patient was  maintained on a cardiac monitor.  I personally viewed and interpreted the cardiac monitor which showed an underlying rhythm of:  sinus rhythm  Medicines ordered and prescription drug management: I ordered medication including Lasix  for edema Reevaluation of the patient after these medicines showed that the patient    improved   Critical Interventions:   admission for treatment of CHF, workup of thrombocytopenia could be due to underlying liver disease  Consultations Obtained: I requested consultation with the admitting physician  triad , and discussed  findings as well as pertinent plan - they recommend: will admit  Reevaluation: After the interventions noted above, I reevaluated the patient and found that they have :improved  Complexity of problems addressed: Patient's presentation is most consistent with  acute presentation with potential threat to life or bodily function  Disposition: After consideration of the diagnostic results and the patient's response to treatment,  I feel that the patent would benefit from admission   .           Final Clinical Impression(s) / ED Diagnoses Final diagnoses:  Acute heart failure with preserved ejection fraction (HFpEF) (HCC)  Pleural effusion  Compression fracture of body of thoracic vertebra (HCC)  Thrombocytopenia (HCC)    Rx / DC Orders ED Discharge Orders     None         Midge Golas, MD 05/17/23 [phone]

## 2023-05-17 NOTE — H&P (Signed)
 History and Physical    Patient: Joseph Raymond FMW:[id number] DOB: [birth year] DOA: 05/16/2023 DOS: the patient was seen and examined on 05/17/2023 [facility]: [facility]  Patient coming from: Home  Chief Complaint:  Chief Complaint  Patient presents with   Back Pain   Shortness of Breath   HPI: Joseph Raymond is a 63 y.o. male with medical history significant of hypertension, aortic, tricuspid, and mitral valve replacement back in 2018 on chronic suppressive antibiotic therapy, paroxysmal atrial fibrillation not on anticoagulation due to history of GI bleed, diabetes mellitus type 2, chronic back pain presents with complaints of lower back pain and and a month-long history of shortness of breath. The dyspnea is exacerbated by walking and lying flat, necessitating the use of two pillows for sleep at night. There is no associated cough or significant leg swelling.  The patient also reported a dull, bilateral lower back pain that currently seems to be present when getting up and walking.  Denies any recent fall or trauma to onset symptoms.  In December, Joseph Raymond was seen at [facility] hospital/9 for similar symptoms and was given a short course of Lasix . The patient has a cardiologist at [facility], Dr. Florencio, whom he follows up with regularly. However, no follow-up appointments were scheduled. The patient has not followed up with his primary care provider at [facility] since the December visit.  The patient has a history of construction work, but denies any history of smoking or drug use. He reported recent weight loss due to dieting, but the exact amount is unknown. The patient also reported moderate alcohol consumption, with two to three beers consumed approximately three times a week.  For his diabetes he has been taking metformin  for management and has not been on insulin .   On admission in the emergency  department patient was noted to be afebrile with stable vital signs.  Labs noted platelets 39, glucose 382, anion gap 12, BNP 553, and high-sensitivity troponin 31->34.  Chest x-ray noted mild vascular congestion with no focal abnormality.  A CT scan of the abdomen pelvis have been obtained which noted morphologic changes in the liver suggestive of underlying cirrhosis, moderate right and mild left pleural effusion, prostamegaly, and other incidental findings noted in formal report.  Patient having he has been Lasix  80 Mg IV x 1 dose.  Review of Systems: As mentioned in the history of present illness. All other systems reviewed and are negative. Past Medical History:  Diagnosis Date   Chronic back pain    Diabetes mellitus without complication (HCC)    Hypertension    Lumbar radiculopathy    Past Surgical History:  Procedure Laterality Date   ESOPHAGOGASTRODUODENOSCOPY N/A 10/01/2019   Procedure: ESOPHAGOGASTRODUODENOSCOPY (EGD);  Surgeon: Toledo, Ladell POUR, MD;  Location: [facility] ENDOSCOPY;  Service: Gastroenterology;  Laterality: N/A;   ESOPHAGOGASTRODUODENOSCOPY (EGD) WITH PROPOFOL  N/A 10/08/2022   Procedure: ESOPHAGOGASTRODUODENOSCOPY (EGD) WITH PROPOFOL ;  Surgeon: Jinny Carmine, MD;  Location: [facility] ENDOSCOPY;  Service: Endoscopy;  Laterality: N/A;   KNEE SURGERY     Social History:  reports that he has never smoked. He has never used smokeless tobacco. He reports that he does not currently use alcohol after a past usage of about 2.0 standard drinks of alcohol per week. He reports that he does not use drugs.  No Known Allergies  Family History  Problem Relation Age of Onset   Diabetes Mellitus II Mother     Prior to Admission  medications   Medication Sig Start Date End Date Taking? Authorizing Provider  amoxicillin  (AMOXIL ) 500 MG capsule Take 1 capsule (500 mg total) by mouth 2 (two) times daily. 04/21/23 07/20/23  Cyrena Mylar, MD  ferrous sulfate  325 (65 FE) MG EC tablet Take 1 tablet (325  mg total) by mouth daily with breakfast. 10/10/22 01/08/23  Caleen Burgess BROCKS, MD  furosemide  (LASIX ) 20 MG tablet Take 1 tablet (20 mg total) by mouth daily for 7 days. 04/21/23 04/28/23  Willo Dunnings, MD  losartan  (COZAAR ) 25 MG tablet Take 25 mg by mouth daily.    [provider]  pantoprazole  (PROTONIX ) 40 MG tablet Take 1 tablet (40 mg total) by mouth 2 (two) times daily before a meal. 10/10/22   Amin, Ankit C, MD  senna-docusate (SENOKOT-S) 8.6-50 MG tablet Take 1-2 tablets by mouth at bedtime as needed for moderate constipation or mild constipation. 10/10/22   Amin, Ankit C, MD  terbinafine (LAMISIL) 250 MG tablet Take 250 mg by mouth daily.    [provider]    Physical Exam: Vitals:   05/17/23 0447 05/17/23 0521 05/17/23 0530 05/17/23 0545  BP: (!) 106/91  109/88 105/88  Pulse: 73  77 73  Resp: 17     Temp:  98.2 F (36.8 C)    TempSrc:  Oral    SpO2: 97%  95% 92%  Weight:      Height:         Constitutional: Elderly male who appears to be in no acute distress at this time sitting up on hospital gurney Eyes: PERRL, lids and conjunctivae normal ENMT: Mucous membranes are moist.  Normal dentition.  Neck: normal, supple  Respiratory: Patient clear to auscultation without significant wheezes or rhonchi appreciated. Cardiovascular: Regular rate and rhythm, no murmurs / rubs / gallops. No extremity edema.   Abdomen: no tenderness, no masses palpated. No hepatosplenomegaly. Bowel sounds positive.  Musculoskeletal: no clubbing / cyanosis. No joint deformity upper and lower extremities. Good ROM, no contractures. Normal muscle tone.  Skin: no rashes, lesions, ulcers. No induration Neurologic: CN 2-12 grossly intact. Sensation intact, DTR normal. Strength 5/5 in all 4.  Psychiatric: Normal judgment and insight. Alert and oriented x 3. Normal mood.   Data Reviewed:  EKG reveals sinus rhythm with premature atrial complexes, left posterior fascicular block and minimal  voltage criteria for LVH.  Reviewed labs, imaging, and pertinent records as documented. Assessment and Plan:  Heart failure with reduced ejection fraction Acute on chronic.  Presents with complaints of progressively worsening shortness of breath over the last month with reports of orthopnea.  BNP elevated at 553.Chest x-ray noting mild vascular congestion with no other focal abnormality.  Patient was just noted to have echocardiogram noted EF to be 45 to 50% with global hypokinesis, left ventricular diastolic parameters consistent with grade 1 diastolic dysfunction, mitral valve was myxomatus with moderate stenosis, and aortic valve was tricuspid with moderate regurgitation noted on 04/21/2023.  This is a acute decrease from prior echocardiogram back in 2022 which noted EF to be 60 to 65% with indeterminate diastolic parameterspatient had been given Lasix  80 mg IV x 1 dose. -Admit to a cardiac telemetry bed -Heart failure order set utilized -Strict I&Os and daily weights -Check TSH and free T4 -Lasix  40 mg IV twice daily -Continue ARB -Monitor kidney function daily diuresis -Cardiology consulted we will follow-up for any further recommendations  Elevated troponin Chronic.  High-sensitivity troponins mildly elevated 31-> 34.  Slightly higher than  prior checked last month.  Echocardiogram as noted above. -Defer repeat study to cardiology  Uncontrolled diabetes mellitus type 2 with hyperglycemia On admission glucose elevated up to 382.  Last hemoglobin A1c was 9.2 when checked on 10/07/2022. -Hypoglycemic protocols -Check hemoglobin A1c -Start Semglee  10 units daily -CBGs before every meal with sensitive SSI -Adjust regimen as needed -Diabetic education consulted  Thrombocytopenia Acute on chronic.  On admission platelet count noted to be 39 which is acutely lower than when last checked on 12/9 of 120.  Thought likely related to liver disease. -Continue to monitor  Suspected cirrhosis CT  imaging noted concern for morphologic changes consistent with cirrhosis.  Hepatitis panel was negative back in 2022. -Check hepatitis panel, LFTs, GGT -Likely needs outpatient referral to gastroenterology  History of aortic, tricuspid, and mitral valve replacement History of endocarditis Patient with aortic valve insufficiency s/p aortic valve and tricuspid valve bioprosthetic replacement, mitral valve regurgitation s/p MVR, and h/o endocarditis on chronic antibiotic therapy. -Continue amoxicillin   Paroxysmal atrial fibrillation/flutter Patient with a prior history of paroxysmal atrial fibrillation not on anticoagulation due to history of GI bleed.  History of alcohol use Patient reports drinking 2-3 beers 3-4 times a week on average.  Patient denies any history of withdrawals. -CIWA protocols initiated with Ativan  as needed -Thiamine , MVI, folic acid   Obesity BMI 38.45 kg/m   GERD -Continue Protonix   DVT prophylaxis: SCDs due to Normocytic anemia Advance Care Planning:   Code Status: Full Code   Consults: Cardiology  Family Communication: Daughter updated at bedside  Severity of Illness: The appropriate patient status for this patient is INPATIENT. Inpatient status is judged to be reasonable and necessary in order to provide the required intensity of service to ensure the patient's safety. The patient's presenting symptoms, physical exam findings, and initial radiographic and laboratory data in the context of their chronic comorbidities is felt to place them at high risk for further clinical deterioration. Furthermore, it is not anticipated that the patient will be medically stable for discharge from the hospital within 2 midnights of admission.   * I certify that at the point of admission it is my clinical judgment that the patient will require inpatient hospital care spanning beyond 2 midnights from the point of admission due to high intensity of service, high risk for further  deterioration and high frequency of surveillance required.*  Author: Maximino DELENA Sharps, MD 05/17/2023 7:13 AM  For on call review [email or web address].

## 2023-05-17 NOTE — Plan of Care (Signed)
  Problem: Education: Goal: Ability to describe self-care measures that may prevent or decrease complications (Diabetes Survival Skills Education) will improve Outcome: Progressing Goal: Individualized Educational Video(s) Outcome: Progressing   Problem: Education: Goal: Individualized Educational Video(s) Outcome: Progressing   

## 2023-05-17 NOTE — ED Notes (Signed)
 ED TO INPATIENT HANDOFF REPORT  ED Nurse Name and Phone #: Clotilda Ades [phone]  S Name/Age/Gender Joseph Raymond 63 y.o. male Room/Bed: 021C/021C  Code Status   Code Status: Full Code  Home/SNF/Other Home Patient oriented to: self, place, time, and situation Is this baseline? Yes   Triage Complete: Triage complete  Chief Complaint Heart failure with reduced ejection fraction (HCC) [I50.20]  Triage Note The pt is c/o lower back pain for one week no known injury   he is also c/o sob for  4-6 weeks  no sob now   Allergies No Known Allergies  Level of Care/Admitting Diagnosis ED Disposition     ED Disposition  Admit   Condition  --   Comment  Hospital Area: [facility] [[id number]]  Level of Care: Telemetry Cardiac [[id number]]  May admit patient to [facility] or [facility] if equivalent level of care is available:: No  Covid Evaluation: Asymptomatic - no recent exposure (last 10 days) testing not required  Diagnosis: Heart failure with reduced ejection fraction [facility]) [[id number]]  Admitting Physician: CLAUDENE MAXIMINO LABOR [[id number]]  Attending Physician: CLAUDENE MAXIMINO LABOR [[id number]]  Certification:: I certify this patient will need inpatient services for at least 2 midnights  Expected Medical Readiness: 05/19/2023          B Medical/Surgery History Past Medical History:  Diagnosis Date   Chronic back pain    Diabetes mellitus without complication (HCC)    Hypertension    Lumbar radiculopathy    Past Surgical History:  Procedure Laterality Date   ESOPHAGOGASTRODUODENOSCOPY N/A 10/01/2019   Procedure: ESOPHAGOGASTRODUODENOSCOPY (EGD);  Surgeon: Toledo, Ladell POUR, MD;  Location: [facility] ENDOSCOPY;  Service: Gastroenterology;  Laterality: N/A;   ESOPHAGOGASTRODUODENOSCOPY (EGD) WITH PROPOFOL  N/A 10/08/2022   Procedure: ESOPHAGOGASTRODUODENOSCOPY (EGD) WITH PROPOFOL ;  Surgeon: Jinny Carmine, MD;  Location: [facility] ENDOSCOPY;  Service: Endoscopy;  Laterality: N/A;    KNEE SURGERY       A IV Location/Drains/Wounds Patient Lines/Drains/Airways Status     Active Line/Drains/Airways     Name Placement date Placement time Site Days   Peripheral IV 05/17/23 20 G Anterior;Right Forearm 05/17/23  0450  Forearm  less than 1            Intake/Output Last 24 hours  Intake/Output Summary (Last 24 hours) at 05/17/2023 1418 Last data filed at 05/17/2023 1248 Gross per 24 hour  Intake --  Output 2100 ml  Net -2100 ml    Labs/Imaging Results for orders placed or performed during the hospital encounter of 05/16/23 (from the past 48 hours)  Urinalysis, Routine w reflex microscopic -Urine, Clean Catch     Status: Abnormal   Collection Time: 05/16/23  4:18 AM  Result Value Ref Range   Color, Urine YELLOW YELLOW   APPearance CLEAR CLEAR   Specific Gravity, Urine 1.030 1.005 - 1.030   pH 5.0 5.0 - 8.0   Glucose, UA >=500 (A) NEGATIVE mg/dL   Hgb urine dipstick NEGATIVE NEGATIVE   Bilirubin Urine NEGATIVE NEGATIVE   Ketones, ur NEGATIVE NEGATIVE mg/dL   Protein, ur NEGATIVE NEGATIVE mg/dL   Nitrite NEGATIVE NEGATIVE   Leukocytes,Ua NEGATIVE NEGATIVE   RBC / HPF 0-5 0 - 5 RBC/hpf   WBC, UA 0-5 0 - 5 WBC/hpf   Bacteria, UA NONE SEEN NONE SEEN   Squamous Epithelial / HPF 0-5 0 - 5 /HPF   Mucus PRESENT     Comment: Performed at [facility], [address], [place], [place]  [address]  Basic metabolic panel     Status: Abnormal   Collection Time: 05/16/23  7:32 PM  Result Value Ref Range   Sodium 137 135 - 145 mmol/L   Potassium 4.2 3.5 - 5.1 mmol/L   Chloride 99 98 - 111 mmol/L   CO2 26 22 - 32 mmol/L   Glucose, Bld 382 (H) 70 - 99 mg/dL    Comment: Glucose reference range applies only to samples taken after fasting for at least 8 hours.   BUN 11 8 - 23 mg/dL   Creatinine, Ser 9.31 0.61 - 1.24 mg/dL   Calcium  9.2 8.9 - 10.3 mg/dL   GFR, Estimated >39 >39 mL/min    Comment: (NOTE) Calculated using the CKD-EPI Creatinine Equation (2021)     Anion gap 12 5 - 15    Comment: Performed at [facility], [address], [place], [place] [address]  CBC     Status: Abnormal   Collection Time: 05/16/23  7:32 PM  Result Value Ref Range   WBC 7.9 4.0 - 10.5 K/uL   RBC 4.67 4.22 - 5.81 MIL/uL   Hemoglobin 13.8 13.0 - 17.0 g/dL   HCT 57.1 60.9 - 47.9 %   MCV 91.6 80.0 - 100.0 fL   MCH 29.6 26.0 - 34.0 pg   MCHC 32.2 30.0 - 36.0 g/dL   RDW 87.2 88.4 - 84.4 %   Platelets 39 (L) 150 - 400 K/uL    Comment: SPECIMEN CHECKED FOR CLOTS Immature Platelet Fraction may be clinically indicated, consider ordering this additional test [id number] REPEATED TO VERIFY PLATELET COUNT CONFIRMED BY SMEAR    nRBC 0.0 0.0 - 0.2 %    Comment: Performed at [facility], [address], [place], [place] [address]  Troponin I (High Sensitivity)     Status: Abnormal   Collection Time: 05/16/23  7:32 PM  Result Value Ref Range   Troponin I (High Sensitivity) 31 (H) <18 ng/L    Comment: (NOTE) Elevated high sensitivity troponin I (hsTnI) values and significant  changes across serial measurements may suggest ACS but many other  chronic and acute conditions are known to elevate hsTnI results.  Refer to the Links section for chest pain algorithms and additional  guidance. Performed at [facility], [address] [place], [place] [address]   Brain natriuretic peptide     Status: Abnormal   Collection Time: 05/16/23  7:32 PM  Result Value Ref Range   B Natriuretic Peptide 553.0 (H) 0.0 - 100.0 pg/mL    Comment: Performed at [facility], [address], [place], [place] [address]  Troponin I (High Sensitivity)     Status: Abnormal   Collection Time: 05/16/23  9:34 PM  Result Value Ref Range   Troponin I (High Sensitivity) 34 (H) <18 ng/L    Comment: (NOTE) Elevated high sensitivity troponin I (hsTnI) values and significant  changes across serial measurements may suggest ACS but many other  chronic and acute conditions are  known to elevate hsTnI results.  Refer to the Links section for chest pain algorithms and additional  guidance. Performed at [facility], [address] [place], [place] [address]   T4, free     Status: Abnormal   Collection Time: 05/17/23  8:01 AM  Result Value Ref Range   Free T4 1.16 (H) 0.61 - 1.12 ng/dL    Comment: (NOTE) Biotin ingestion may interfere with free T4 tests. If the  results are inconsistent with the TSH level, previous test results, or the clinical presentation, then consider biotin interference. If needed, order repeat testing after stopping biotin. Performed at [facility], [address], [place], [place] [address]   Protime-INR     Status: None   Collection Time: 05/17/23  8:01 AM  Result Value Ref Range   Prothrombin Time 14.2 11.4 - 15.2 seconds   INR 1.1 0.8 - 1.2    Comment: (NOTE) INR goal varies based on device and disease states. Performed at [facility], [address], [place], [place] [address]   APTT     Status: None   Collection Time: 05/17/23  8:01 AM  Result Value Ref Range   aPTT 27 24 - 36 seconds    Comment: Performed at [facility], [address] [place], [place] [address]  Hepatitis panel, acute     Status: None   Collection Time: 05/17/23  8:01 AM  Result Value Ref Range   Hepatitis B Surface Ag NON REACTIVE NON REACTIVE   HCV Ab NON REACTIVE NON REACTIVE    Comment: (NOTE) Nonreactive HCV antibody screen is consistent with no HCV infections,  unless recent infection is suspected or other evidence exists to indicate HCV infection.     Hep A IgM NON REACTIVE NON REACTIVE   Hep B C IgM NON REACTIVE NON REACTIVE    Comment: Performed at [facility], [address] [place], [place] [address]  TSH     Status: None   Collection Time: 05/17/23  8:01 AM  Result Value Ref Range   TSH 1.832 0.350 - 4.500 uIU/mL    Comment: Performed by a 3rd Generation assay with a functional sensitivity of  <=0.01 uIU/mL. Performed at [facility], [address], [place], [place] [address]   Hepatic function panel     Status: Abnormal   Collection Time: 05/17/23  8:01 AM  Result Value Ref Range   Total Protein 6.9 6.5 - 8.1 g/dL   Albumin 4.0 3.5 - 5.0 g/dL   AST 50 (H) 15 - 41 U/L   ALT 57 (H) 0 - 44 U/L   Alkaline Phosphatase 67 38 - 126 U/L   Total Bilirubin 1.9 (H) 0.0 - 1.2 mg/dL   Bilirubin, Direct 0.5 (H) 0.0 - 0.2 mg/dL   Indirect Bilirubin 1.4 (H) 0.3 - 0.9 mg/dL    Comment: Performed at [facility], [address] [place], [place] [address]  Magnesium     Status: None   Collection Time: 05/17/23  8:01 AM  Result Value Ref Range   Magnesium 1.9 1.7 - 2.4 mg/dL    Comment: Performed at [facility], [address], [place], [place] [address]  CBG monitoring, ED     Status: Abnormal   Collection Time: 05/17/23 11:12 AM  Result Value Ref Range   Glucose-Capillary 410 (H) 70 - 99 mg/dL    Comment: Glucose reference range applies only to samples taken after fasting for at least 8 hours.  CBG monitoring, ED     Status: Abnormal   Collection Time: 05/17/23 12:34 PM  Result Value Ref Range   Glucose-Capillary 300 (H) 70 - 99 mg/dL    Comment: Glucose reference range applies only to samples taken after fasting for at least 8 hours.   CT Renal Stone Study Result Date: 05/17/2023 CLINICAL DATA:  63 year old male with history of abdominal and flank  pain. EXAM: CT ABDOMEN AND PELVIS WITHOUT CONTRAST TECHNIQUE: Multidetector CT imaging of the abdomen and pelvis was performed following the standard protocol without IV contrast. RADIATION DOSE REDUCTION: This exam was performed according to the departmental dose-optimization program which includes automated exposure control, adjustment of the mA and/or kV according to patient size and/or use of iterative reconstruction technique. COMPARISON:  CT of the abdomen and pelvis 08/13/2020. FINDINGS: Lower chest: Moderate right and  small left pleural effusions lie dependently. Areas of dependent atelectasis are noted in the lower lobes of the lungs bilaterally. Postoperative changes of tricuspid annuloplasty and mitral valve replacement (stented bioprosthesis) are noted. Hepatobiliary: Liver has a shrunken appearance and nodular contour, suggesting underlying cirrhosis. No discrete cystic or solid hepatic lesions are confidently identified on today's noncontrast CT examination. Unenhanced appearance of the gallbladder is unremarkable. Pancreas: No definite pancreatic mass or peripancreatic fluid collections or inflammatory changes are noted on today's noncontrast CT examination. Spleen: Unremarkable. Adrenals/Urinary Tract: There are no abnormal calcifications within the collecting system of either kidney, along the course of either ureter, or within the lumen of the urinary bladder. No hydroureteronephrosis or perinephric stranding to suggest urinary tract obstruction at this time. The unenhanced appearance of the kidneys is unremarkable bilaterally. Unenhanced appearance of the urinary bladder is normal. Bilateral adrenal glands are normal in appearance. Stomach/Bowel: Unenhanced appearance of the stomach is normal. There is no pathologic dilatation of small bowel or colon. Normal appendix. Vascular/Lymphatic: Atherosclerosis throughout the abdominal aorta and pelvic vasculature. No lymphadenopathy noted in the abdomen or pelvis. Reproductive: Prostate gland is mildly enlarged measuring 6.6 x 4.8 cm. Other: No significant volume of ascites.  No pneumoperitoneum. Musculoskeletal: Chronic appearing compression fracture of T12 with 25% loss of anterior vertebral body height. Multilevel degenerative disc disease, most severe at L4-L5 where there is complete loss of intervertebral disc height and partial bony fusion. There are no aggressive appearing lytic or blastic lesions noted in the visualized portions of the skeleton. IMPRESSION: 1. No  acute findings are noted in the abdomen or pelvis to account for the patient's symptoms. 2. Morphologic changes in the liver suggesting underlying cirrhosis. 3. Moderate right and small left pleural effusions lying dependently. 4. Prostatomegaly. 5. Aortic atherosclerosis. 6. Additional incidental findings, as above. Electronically Signed   By: Toribio Aye M.D.   On: 05/17/2023 06:13   DG Chest 2 View Result Date: 05/16/2023 CLINICAL DATA:  Shortness of breath EXAM: CHEST - 2 VIEW COMPARISON:  04/21/2019 FINDINGS: Cardiac shadow is stable. Postsurgical changes are again seen. Mild vascular congestion is noted. Small left pleural effusion is noted. No acute bony abnormality is seen. Chronic lower thoracic compression deformity is seen. IMPRESSION: Mild vascular congestion.  No other focal abnormality is noted. Electronically Signed   By: Oneil Devonshire M.D.   On: 05/16/2023 21:19    Pending Labs Unresulted Labs (From admission, onward)     Start     Ordered   05/18/23 0500  Basic metabolic panel  Daily,   R     Comments: As Scheduled for 5 days    05/17/23 0801   05/18/23 0500  Magnesium  Tomorrow morning,   R        05/17/23 1253   05/18/23 0500  Phosphorus  Tomorrow morning,   R        05/17/23 1253   05/17/23 0801  Hemoglobin A1c  Add-on,   AD        05/17/23 0801  Vitals/Pain Today's Vitals   05/17/23 1215 05/17/23 1230 05/17/23 1300 05/17/23 1330  BP: 119/87 115/79 99/75 100/65  Pulse: 77 75 79 80  Resp: (!) 22 (!) 22 (!) 22 (!) 27  Temp:      TempSrc:      SpO2: 91% 91% 95% 94%  Weight:      Height:      PainSc:        Isolation Precautions No active isolations  Medications Medications  sodium chloride  flush (NS) 0.9 % injection 3 mL (3 mLs Intravenous Given 05/17/23 1043)  sodium chloride  flush (NS) 0.9 % injection 3 mL (has no administration in time range)  0.9 %  sodium chloride  infusion (has no administration in time range)  acetaminophen  (TYLENOL )  tablet 650 mg (has no administration in time range)  ondansetron  (ZOFRAN ) injection 4 mg (has no administration in time range)  furosemide  (LASIX ) injection 40 mg (has no administration in time range)  losartan  (COZAAR ) tablet 25 mg (25 mg Oral Given 05/17/23 1044)  insulin  aspart (novoLOG ) injection 0-9 Units (9 Units Subcutaneous Given 05/17/23 1120)  insulin  glargine-yfgn (SEMGLEE ) injection 10 Units (10 Units Subcutaneous Given 05/17/23 1121)  insulin  aspart (novoLOG ) injection 0-5 Units (has no administration in time range)  lidocaine  (LIDODERM ) 5 % 2 patch (2 patches Transdermal Patch Applied 05/17/23 1246)  LORazepam  (ATIVAN ) tablet 1-4 mg (has no administration in time range)    Or  LORazepam  (ATIVAN ) injection 1-4 mg (has no administration in time range)  thiamine  (VITAMIN B1) tablet 100 mg (has no administration in time range)    Or  thiamine  (VITAMIN B1) injection 100 mg (has no administration in time range)  folic acid  (FOLVITE ) tablet 1 mg (has no administration in time range)  multivitamin with minerals tablet 1 tablet (has no administration in time range)  furosemide  (LASIX ) injection 80 mg (80 mg Intravenous Given 05/17/23 0450)  guaiFENesin  (MUCINEX ) 12 hr tablet 600 mg (600 mg Oral Given 05/17/23 0522)    Mobility walks     Focused Assessments    R Recommendations: See Admitting Provider Note  Report given to:   Additional Notes: Pt daughter at bedside speaks english.

## 2023-05-18 ENCOUNTER — Inpatient Hospital Stay (HOSPITAL_COMMUNITY): Payer: Medicare HMO

## 2023-05-18 DIAGNOSIS — I5020 Unspecified systolic (congestive) heart failure: Secondary | ICD-10-CM | POA: Diagnosis not present

## 2023-05-18 DIAGNOSIS — I342 Nonrheumatic mitral (valve) stenosis: Secondary | ICD-10-CM | POA: Diagnosis not present

## 2023-05-18 DIAGNOSIS — D696 Thrombocytopenia, unspecified: Secondary | ICD-10-CM | POA: Diagnosis not present

## 2023-05-18 DIAGNOSIS — R7989 Other specified abnormal findings of blood chemistry: Secondary | ICD-10-CM | POA: Diagnosis not present

## 2023-05-18 DIAGNOSIS — I5033 Acute on chronic diastolic (congestive) heart failure: Secondary | ICD-10-CM | POA: Diagnosis not present

## 2023-05-18 DIAGNOSIS — Z8679 Personal history of other diseases of the circulatory system: Secondary | ICD-10-CM | POA: Diagnosis not present

## 2023-05-18 LAB — BASIC METABOLIC PANEL
Anion gap: 10 (ref 5–15)
BUN: 15 mg/dL (ref 8–23)
CO2: 30 mmol/L (ref 22–32)
Calcium: 9.3 mg/dL (ref 8.9–10.3)
Chloride: 96 mmol/L — ABNORMAL LOW (ref 98–111)
Creatinine, Ser: 0.73 mg/dL (ref 0.61–1.24)
GFR, Estimated: >60 mL/min (ref >60)
Glucose, Bld: 303 mg/dL — ABNORMAL HIGH (ref 70–99)
Potassium: 3.5 mmol/L (ref 3.5–5.1)
Sodium: 136 mmol/L (ref 135–145)

## 2023-05-18 LAB — MAGNESIUM: Magnesium: 2 mg/dL (ref 1.7–2.4)

## 2023-05-18 LAB — GLUCOSE, CAPILLARY
Glucose-Capillary: 256 mg/dL — ABNORMAL HIGH (ref 70–99)
Glucose-Capillary: 316 mg/dL — ABNORMAL HIGH (ref 70–99)
Glucose-Capillary: 222 mg/dL — ABNORMAL HIGH (ref 70–99)
Glucose-Capillary: 170 mg/dL — ABNORMAL HIGH (ref 70–99)

## 2023-05-18 LAB — CBC
HCT: 45.5 % (ref 39.0–52.0)
Hemoglobin: 14.8 g/dL (ref 13.0–17.0)
MCH: 29.5 pg (ref 26.0–34.0)
MCHC: 32.5 g/dL (ref 30.0–36.0)
MCV: 90.6 fL (ref 80.0–100.0)
Platelets: 44 10*3/uL — ABNORMAL LOW (ref 150–400)
RBC: 5.02 MIL/uL (ref 4.22–5.81)
RDW: 12.5 % (ref 11.5–15.5)
WBC: 7.8 10*3/uL (ref 4.0–10.5)
nRBC: 0 % (ref 0.0–0.2)

## 2023-05-18 LAB — PHOSPHORUS: Phosphorus: 5.3 mg/dL — ABNORMAL HIGH (ref 2.5–4.6)

## 2023-05-18 LAB — GAMMA GT: GGT: 59 U/L — ABNORMAL HIGH (ref 7–50)

## 2023-05-18 MED ORDER — FUROSEMIDE 10 MG/ML IJ SOLN
40.0000 mg | Freq: Every day | INTRAMUSCULAR | Status: DC
  Administered 2023-05-19: 40 mg via INTRAVENOUS
  Filled 2023-05-18: qty 4

## 2023-05-18 NOTE — Evaluation (Signed)
 Physical Therapy Evaluation & Discharge Patient Details Name: Joseph Raymond MRN: [id number] DOB: 06-30-1960 Today's Date: 05/18/2023  History of Present Illness  Pt is a 63 y.o. male who presented 05/16/23 with lower back pain and x1 month hx of SOB. Pt admitted with heart failure. PMH: HTN, paroxysmal afib, GI bleed, DM2, chronic back pain, and aortic, tricuspid, and mitral valve replacement back in 2018 on chronic suppressive antibiotic therapy   Clinical Impression  Pt presents with condition above. Pt is currently functioning near his baseline, other than some dizziness with mobility, but VSS on RA. He is not needing any assistance or DME to mobilize at this time. Pt reports he has R ankle dorsiflexion weakness and thus a R foot slap gait pattern at baseline ever since a cardiac condition a while ago. Noted prominent spinous process around the T10-12 area and the pt's back pain was reproduced with posterior-anterior glides at this spot. He denied any known injury or pop and denied pain/symptoms in the legs. Notified MD and provided a short duration of gentle grade 1-2 PA glides to this area. Will recommend OPPT follow-up to manage his back pain. Encouraged pt to ambulate halls while here. Educated pt to mobilize frequently, weight self daily, reduce sodium/processed foods intake, and to use fresh/frozen fruits/veggies or at least rinse the canned ones to assist in managing his heart failure. He verbalized understanding. No further acute PT needs identified as all education has been completed an questions answered and pt is at his baseline. All further PT needs can be met in next venue of care.   SpO2 >/= 93% RA throughout, BP 114/86 sitting, 110/83 standing, VSS throughout         If plan is discharge home, recommend the following:     Can travel by private vehicle        Equipment Recommendations None recommended by PT  Recommendations for Other Services       Functional Status  Assessment Patient has had a recent decline in their functional status and demonstrates the ability to make significant improvements in function in a reasonable and predictable amount of time.     Precautions / Restrictions Precautions Precautions: None Restrictions Weight Bearing Restrictions Per Provider Order: No      Mobility  Bed Mobility Overal bed mobility: Independent             General bed mobility comments: No assistance needed for supine <> sit and supine <> prone positions.    Transfers Overall transfer level: Independent Equipment used: None               General transfer comment: No assistance needed, no LOB    Ambulation/Gait Ambulation/Gait assistance: Modified independent (Device/Increase time) Gait Distance (Feet): 380 Feet Assistive device: None Gait Pattern/deviations: Step-through pattern, Wide base of support, Decreased dorsiflexion - right Gait velocity: WFL Gait velocity interpretation: >2.62 ft/sec, indicative of community ambulatory   General Gait Details: Pt ambulates with R foot slap and wide BOS. Excess lateral trunk flexion also noted. He reports this is baseline. No overt LOB  Stairs            Wheelchair Mobility     Tilt Bed    Modified Rankin (Stroke Patients Only)       Balance Overall balance assessment: Mild deficits observed, not formally tested  Pertinent Vitals/Pain Pain Assessment Pain Assessment: Faces Faces Pain Scale: Hurts little more Pain Location: low back Pain Descriptors / Indicators: Discomfort, Grimacing, Guarding Pain Intervention(s): Limited activity within patient's tolerance, Monitored during session    Home Living Family/patient expects to be discharged to:: Private residence Living Arrangements: Alone Available Help at Discharge: Family;Friend(s);Available 24 hours/day Type of Home: Mobile home Home Access: Stairs to  enter Entrance Stairs-Rails: None Entrance Stairs-Number of Steps: 2   Home Layout: One level Home Equipment: None      Prior Function Prior Level of Function : Independent/Modified Independent;Driving;Working/employed               ADLs Comments: works Arboriculturist Extremity Assessment: Overall WFL for tasks assessed    Lower Extremity Assessment Lower Extremity Assessment: RLE deficits/detail RLE Deficits / Details: noted weakness with ankle dorsiflexion, MMT score of 4/5 compared to 4+ to 5 bil elsewhere, pt reports his R lower leg has been weak since a cardiac issue a while ago and he had muscle atrophy; denied numbness/tingling bil RLE Sensation: WNL    Cervical / Trunk Assessment Cervical / Trunk Assessment: Other exceptions Cervical / Trunk Exceptions: noted a prominent spinous process with palpation between T10-12, posterior-anterior glides at this prominent spinous process reproduced his back pain and pt was tender with it  Communication   Communication Communication: No apparent difficulties;Other (comment) (used video interpreter throughout, Toribio [phone])  Cognition Arousal: Alert Behavior During Therapy: WFL for tasks assessed/performed Overall Cognitive Status: Within Functional Limits for tasks assessed                                          General Comments General comments (skin integrity, edema, etc.): encouraged pt to ambulate halls while here; educated pt to mobilize frequently, weight self daily, reduce sodium/processed foods intake, and to use fresh/frozen fruits/veggies or at least rinse the canned ones; he verbalized understanding; SpO2 >/= 93% RA throughout, BP 114/86 sitting, 110/83 standing, VSS throughout    Exercises Other Exercises Other Exercises: grade 1-2 PA spinous process glides to prominent spinous process between T10-12, to pt tolerance, < 2 min    Assessment/Plan    PT Assessment All further PT needs can be met in the next venue of care  PT Problem List Decreased strength;Decreased activity tolerance;Decreased balance;Decreased mobility;Pain       PT Treatment Interventions      PT Goals (Current goals can be found in the Care Plan section)  Acute Rehab PT Goals Patient Stated Goal: to reduce back pain and dizziness PT Goal Formulation: All assessment and education complete, DC therapy Time For Goal Achievement: 05/19/23 Potential to Achieve Goals: Good    Frequency       Co-evaluation               AM-PAC PT 6 Clicks Mobility  Outcome Measure Help needed turning from your back to your side while in a flat bed without using bedrails?: None Help needed moving from lying on your back to sitting on the side of a flat bed without using bedrails?: None Help needed moving to and from a bed to a chair (including a wheelchair)?: None Help needed standing up from a chair using your arms (e.g., wheelchair or bedside chair)?: None Help needed to walk in hospital room?: None Help needed climbing  3-5 steps with a railing? : None 6 Click Score: 24    End of Session   Activity Tolerance: Patient tolerated treatment well Patient left: in bed;with call bell/phone within reach Nurse Communication: Mobility status PT Visit Diagnosis: Other abnormalities of gait and mobility (R26.89);Unsteadiness on feet (R26.81);Muscle weakness (generalized) (M62.81);Pain Pain - part of body:  (back)    Time: 9083-9047 PT Time Calculation (min) (ACUTE ONLY): 36 min   Charges:   PT Evaluation $PT Eval Low Complexity: 1 Low PT Treatments $Therapeutic Activity: 8-22 mins PT General Charges $$ ACUTE PT VISIT: 1 Visit         Theo Ferretti, PT, DPT Acute Rehabilitation Services  Office: [phone]   Theo CHRISTELLA Ferretti 05/18/2023, 3:47 PM

## 2023-05-18 NOTE — Inpatient Diabetes Management (Signed)
 Inpatient Diabetes Program Recommendations  AACE/ADA: New Consensus Statement on Inpatient Glycemic Control (2015)  Target Ranges:  Prepandial:   less than 140 mg/dL      Peak postprandial:   less than 180 mg/dL (1-2 hours)      Critically ill patients:  140 - 180 mg/dL    Latest Reference Range & Units 05/17/23 11:12 05/17/23 12:34 05/17/23 21:06  Glucose-Capillary 70 - 99 mg/dL 589 (H)  9 units Novolog   10 units Semglee  300 (H) 264 (H)  2 units Novolog    (H): Data is abnormally high  Latest Reference Range & Units 05/18/23 06:18  Glucose-Capillary 70 - 99 mg/dL 743 (H)  5 units Novolog   10 units Semglee   (H): Data is abnormally high      Home DM Meds: None listed  Current Orders: Semglee  10 units daily     Novolog  Sensitive Correction Scale/ SSI (0-9 units) TID AC + HS     Current A1c Pending Last A1c was 9.21% (May 2024) PCP:  [facility]     MD- Please consider:  1. Increase Semglee  to 15 units Daily (since 10 unit dose already given this AM, please also order Semglee  5 units X 1 dose to be given today as well)  2. Start Novolog  Meal Coverage: Novolog  4 units TID with meals HOLD if pt NPO HOLD if pt eats <50% meals  3. Note A1c Pending.  No meds listed at home.  Anticipate pt will need either orals and/or insulin  at time of d/c     --Will follow patient during hospitalization--  Adina Rudolpho Arrow RN, MSN, CDCES Diabetes Coordinator Inpatient Glycemic Control Team Team Pager: [phone] (8a-5p)

## 2023-05-18 NOTE — Hospital Course (Signed)
 Joseph Raymond is a 63 y.o. male with a history of hypertension, aortic/tricuspid/mitral valve replacement, endocarditis on suppressive antibiotic therapy, paroxysmal atrial fibrillation, GI bleeding, diabetes mellitus type 2, chronic back pain.  Patient presented secondary to back pain and shortness of breath and was found to have evidence of acute heart failure.  Cardiology consulted.  Patient started on Lasix  IV.

## 2023-05-18 NOTE — Progress Notes (Signed)
 Progress Note  Patient Name: Joseph Raymond Date of Encounter: 05/18/2023  Primary Cardiologist: Cara JONETTA Lovelace, MD  Interval Summary   Chart reviewed.  Spoke with patient and granddaughter.  He reports less shortness of breath.  Blood pressure has trended down with systolic in the 90s most recently.  Approximately 2000 cc net out more than in last 24 hours.  Vital Signs    Vitals:   05/18/23 0024 05/18/23 0437 05/18/23 0800 05/18/23 1137  BP: (!) 92/59 (!) 91/57 99/78 96/74   Pulse: 77 87  81  Resp:    18  Temp: 98.5 F (36.9 C) 98.4 F (36.9 C) 98.4 F (36.9 C) 98.9 F (37.2 C)  TempSrc: Oral Oral Oral Oral  SpO2: 94% 90% 95% 90%  Weight:  91.9 kg    Height:        Intake/Output Summary (Last 24 hours) at 05/18/2023 1209 Last data filed at 05/18/2023 1100 Gross per 24 hour  Intake 717 ml  Output 2270 ml  Net -1553 ml   Filed Weights   05/16/23 1914 05/17/23 1500 05/18/23 0437  Weight: 101.6 kg 92.2 kg 91.9 kg    Physical Exam   GEN: No acute distress.   Neck: No JVD. Cardiac: RRR, 2/6 systolic murmur, no obvious diastolic rumble, no S3.  Respiratory: Nonlabored.  Decreased breath sounds at the bases. GI: Protuberant, bowel sounds present. MS: Trace ankle edema. Neuro:  Nonfocal. Psych: Alert and oriented x 3. Normal affect.  ECG/Telemetry    Telemetry reviewed showing sinus rhythm.  Labs    Chemistry Recent Labs  Lab 05/16/23 1932 05/17/23 0801 05/18/23 0206  NA 137  --  136  K 4.2  --  3.5  CL 99  --  96*  CO2 26  --  30  GLUCOSE 382*  --  303*  BUN 11  --  15  CREATININE 0.68  --  0.73  CALCIUM  9.2  --  9.3  PROT  --  6.9  --   ALBUMIN  --  4.0  --   AST  --  50*  --   ALT  --  57*  --   ALKPHOS  --  67  --   BILITOT  --  1.9*  --   GFRNONAA >60  --  >60  ANIONGAP 12  --  10    Hematology Recent Labs  Lab 05/16/23 1932 05/18/23 0903  WBC 7.9 7.8  RBC 4.67 5.02  HGB 13.8 14.8  HCT 42.8 45.5  MCV 91.6 90.6  MCH 29.6  29.5  MCHC 32.2 32.5  RDW 12.7 12.5  PLT 39* 44*   Cardiac Enzymes Recent Labs  Lab 04/21/23 1009 04/21/23 1225 05/16/23 1932 05/16/23 2134  TROPONINIHS 26* 29* 31* 34*   Lipid Panel  No results found for: CHOL, TRIG, HDL, CHOLHDL, VLDL, LDLCALC, LDLDIRECT, LABVLDL  Cardiac Studies   Echocardiogram pending.  Assessment & Plan   1.  Progressive dyspnea on exertion and fatigue over the last month.  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  I reviewed extensive records including follow-up by Dr. Lovelace and recent interval cardiac testing.  I personally reviewed the echocardiogram performed at [facility] in December.  He does not have LV dysfunction, LVEF is 65-70% range with severe LVH.  Mitral prosthesis is very difficult to define structurally with substantial thickening, difficult to appreciate valve leaflet motion.  The mean mitral gradient of 21 mmHg has increased  substantially and suggests severe mitral stenosis which could certainly be contributing to his symptoms.  He does have RV dilatation and mild dysfunction as well.  Aortic prosthesis is mildly thickened with mean AV gradient 17 mmHg and mild aortic regurgitation (dimensionless index 0.44 suggest there could be mild prosthetic AV stenosis).  Tricuspid annuloplasty appears to be intact.  At this point follow-up echocardiogram is pending, he will most likely need a TEE pending stabilization of other medical issues including thrombocytopenia.  He is responding to IV diuretics.   2.  History of endocarditis in 2018 ultimately status post multivalve surgery at [facility] including 25 mm Inspiris bovine pericardial bioprosthetic valve, 29 mm Magna Mitral Ease bovine bioprosthetic valve, and 30 mm Carpentier Edwards Physio ring.   3.  History of atrial fibrillation and flutter status post flutter ablation at [facility] in 2018.  He is not anticoagulated  with history of GI bleeding due to AVMs.   4.  Primary hypertension.  Blood pressure trend down with diuresis.  He is on Cozaar .   5.  Type 2 diabetes mellitus.   6.  Thrombocytopenia, platelet count 39 up to 44 today.  Looks to be fairly chronic per chart review, but progressive.  Platelet count was 120 in December 2024.   7.  Morphologic changes in the liver suggesting cirrhosis per CT imaging.  Follow-up echocardiogram.  Would discontinue Cozaar  and decrease Lasix  to 40 mg IV once daily.  Ultimately need to consider TEE presuming platelet count improves.  Looks to have possible cirrhosis based on CT imaging, uncertain if he has any associated esophageal pathology.  For questions or updates, please contact [facility] Please consult [email or web address] for contact info under   Signed, Jayson Sierras, MD  05/18/2023, 12:09 PM

## 2023-05-18 NOTE — Progress Notes (Signed)
 Echocardiogram 2D Echocardiogram has been performed.  Joseph Raymond 05/18/2023, 3:16 PM

## 2023-05-18 NOTE — Plan of Care (Signed)
  Problem: Education: Goal: Ability to describe self-care measures that may prevent or decrease complications (Diabetes Survival Skills Education) will improve Outcome: Progressing Goal: Individualized Educational Video(s) Outcome: Progressing   Problem: Coping: Goal: Ability to adjust to condition or change in health will improve Outcome: Progressing   Problem: Fluid Volume: Goal: Ability to maintain a balanced intake and output will improve Outcome: Progressing   Problem: Health Behavior/Discharge Planning: Goal: Ability to identify and utilize available resources and services will improve Outcome: Progressing Goal: Ability to manage health-related needs will improve Outcome: Progressing   Problem: Metabolic: Goal: Ability to maintain appropriate glucose levels will improve Outcome: Progressing   Problem: Nutritional: Goal: Maintenance of adequate nutrition will improve Outcome: Progressing Goal: Progress toward achieving an optimal weight will improve Outcome: Progressing   Problem: Skin Integrity: Goal: Risk for impaired skin integrity will decrease Outcome: Progressing   Problem: Tissue Perfusion: Goal: Adequacy of tissue perfusion will improve Outcome: Progressing   Problem: Education: Goal: Knowledge of General Education information will improve Description: Including pain rating scale, medication(s)/side effects and non-pharmacologic comfort measures Outcome: Progressing   Problem: Health Behavior/Discharge Planning: Goal: Ability to manage health-related needs will improve Outcome: Progressing   Problem: Clinical Measurements: Goal: Ability to maintain clinical measurements within normal limits will improve Outcome: Progressing Goal: Will remain free from infection Outcome: Progressing Goal: Diagnostic test results will improve Outcome: Progressing Goal: Respiratory complications will improve Outcome: Progressing Goal: Cardiovascular complication will  be avoided Outcome: Progressing   Problem: Activity: Goal: Risk for activity intolerance will decrease Outcome: Progressing   Problem: Nutrition: Goal: Adequate nutrition will be maintained Outcome: Progressing   Problem: Coping: Goal: Level of anxiety will decrease Outcome: Progressing   Problem: Elimination: Goal: Will not experience complications related to bowel motility Outcome: Progressing Goal: Will not experience complications related to urinary retention Outcome: Progressing   Problem: Pain Management: Goal: General experience of comfort will improve Outcome: Progressing   Problem: Safety: Goal: Ability to remain free from injury will improve Outcome: Progressing

## 2023-05-18 NOTE — Progress Notes (Signed)
 PROGRESS NOTE    Sebastyan Snodgrass  FMW:[id number] DOB: 05-08-1961 DOA: 05/16/2023 PCP: [facility]   Brief Narrative: Joseph Raymond is a 63 y.o. male with a history of hypertension, aortic/tricuspid/mitral valve replacement, endocarditis on suppressive antibiotic therapy, paroxysmal atrial fibrillation, GI bleeding, diabetes mellitus type 2, chronic back pain.  Patient presented secondary to back pain and shortness of breath and was found to have evidence of acute heart failure.  Cardiology consulted.  Patient started on Lasix  IV.   Assessment and Plan:  Acute on chronic heart failure with reduced EF Patient with worsening dyspnea with associated orthopnea.  BNP elevated at 553.  Chest x-ray on admission significant for vascular congestion.  Patient also noted to have evidence of moderate right and small left pleural effusions.  Elevated troponin Likely demand ischemia in setting of acute heart failure.  Troponin elevation mild at 31 with delta of 34.  Diabetes mellitus type 2 Uncontrolled with hyperglycemia.  Last hemoglobin A1c of 9.2% from May 2024.  Patient is on medication management as an outpatient.  Patient started on Semglee  10 units and SSI this admission -Follow-up hemoglobin A1c -Continue Semglee  10 units and SSI for now  Thrombocytopenia Appears to be chronic with acute worsening.  Likely related to underlying suspected cirrhosis.  Platelets of 3 39,000 on admission.  Slightly improved to 44,000 today.  No evidence of bleeding. -CBC in a.m.  Hyperphosphatemia Mild. -Recheck phosphorus in a.m.  Possible cirrhosis In setting of underlying alcohol abuse.  Bilirubin of 1.9.  INR 1.1.  Cirrhosis suggested on CT scan this admission.  Patient follows with gastroenterology as an outpatient but not for liver pathology.  Patient will need outpatient GI follow-up. -Right upper quadrant ultrasound  Thoracic back pain -Check thoracic  x-ray  Paroxysmal atrial fibrillation Note noted.  Patient currently in sinus rhythm.  Patient is not on rate or rhythm control.  Patient is not on anticoagulation secondary to history of GI bleeding.  History of aortic, tricuspid, mitral valve replacement History of endocarditis Patient is on chronic antibiotic therapy consisting of amoxicillin . -Continue amoxicillin   GERD -Continue Protonix   Alcohol use Per patient report, he drinks 2-3 beers 3-4 times per week.  No history of alcohol withdrawal syndrome.  Patient started on CIWA protocol.  Aortic atherosclerosis Noted on CT imaging. -Check lipid panel  Obesity, class I Estimated body mass index is 34.78 kg/m as calculated from the following:   Height as of this encounter: 5' 4 (1.626 m).   Weight as of this encounter: 91.9 kg.   DVT prophylaxis: SCDs Code Status:   Code Status: Full Code Family Communication: None at bedside Disposition Plan: Discharge home likely pending ongoing cardiology recommendation/management   Consultants:  Cardiology  Procedures:  Transthoracic Echocardiogram (pending)  Antimicrobials: None    Subjective:  Spanish interpreter: Aloysius [phone]  Patient reports feeling much better today. Dyspnea has improved. No chest pain.  Objective: BP 99/78 (BP Location: Right Arm)   Pulse 87   Temp 98.4 F (36.9 C) (Oral)   Resp 19   Ht 5' 4 (1.626 m)   Wt 91.9 kg   SpO2 95%   BMI 34.78 kg/m   Examination:  General exam: Appears calm and comfortable Respiratory system: Clear to auscultation. Respiratory effort normal. Cardiovascular system: S1 & S2 heard, RRR.  Systolic murmur. Gastrointestinal system: Abdomen is soft and nontender. No organomegaly or masses felt. Normal bowel sounds heard. Central nervous system: Alert and oriented. No focal neurological deficits.  Musculoskeletal: No calf tenderness Psychiatry: Judgement and insight appear normal. Mood & affect appropriate.     Data Reviewed: I have personally reviewed following labs and imaging studies  CBC Lab Results  Component Value Date   WBC 7.9 05/16/2023   RBC 4.67 05/16/2023   HGB 13.8 05/16/2023   HCT 42.8 05/16/2023   MCV 91.6 05/16/2023   MCH 29.6 05/16/2023   PLT 39 (L) 05/16/2023   MCHC 32.2 05/16/2023   RDW 12.7 05/16/2023   LYMPHSABS 1.5 04/21/2023   MONOABS 0.6 04/21/2023   EOSABS 0.4 04/21/2023   BASOSABS 0.0 04/21/2023     Last metabolic panel Lab Results  Component Value Date   NA 136 05/18/2023   K 3.5 05/18/2023   CL 96 (L) 05/18/2023   CO2 30 05/18/2023   BUN 15 05/18/2023   CREATININE 0.73 05/18/2023   GLUCOSE 303 (H) 05/18/2023   GFRNONAA >60 05/18/2023   GFRAA >60 09/30/2019   CALCIUM  9.3 05/18/2023   PHOS 5.3 (H) 05/18/2023   PROT 6.9 05/17/2023   ALBUMIN 4.0 05/17/2023   BILITOT 1.9 (H) 05/17/2023   ALKPHOS 67 05/17/2023   AST 50 (H) 05/17/2023   ALT 57 (H) 05/17/2023   ANIONGAP 10 05/18/2023    GFR: Estimated Creatinine Clearance: 95.4 mL/min (by C-G formula based on SCr of 0.73 mg/dL).  Recent Results (from the past 240 hours)  MRSA Next Gen by PCR, Nasal     Status: None   Collection Time: 05/17/23  3:18 PM   Specimen: Nasal Mucosa; Nasal Swab  Result Value Ref Range Status   MRSA by PCR Next Gen NOT DETECTED NOT DETECTED Final    Comment: (NOTE) The GeneXpert MRSA Assay (FDA approved for NASAL specimens only), is one component of a comprehensive MRSA colonization surveillance program. It is not intended to diagnose MRSA infection nor to guide or monitor treatment for MRSA infections. Test performance is not FDA approved in patients less than 109 years old. Performed at [facility], [address] [place], [place] [address]       Radiology Studies: CT Renal Stone Study Result Date: 05/17/2023 CLINICAL DATA:  63 year old male with history of abdominal and flank pain. EXAM: CT ABDOMEN AND PELVIS WITHOUT CONTRAST TECHNIQUE:  Multidetector CT imaging of the abdomen and pelvis was performed following the standard protocol without IV contrast. RADIATION DOSE REDUCTION: This exam was performed according to the departmental dose-optimization program which includes automated exposure control, adjustment of the mA and/or kV according to patient size and/or use of iterative reconstruction technique. COMPARISON:  CT of the abdomen and pelvis 08/13/2020. FINDINGS: Lower chest: Moderate right and small left pleural effusions lie dependently. Areas of dependent atelectasis are noted in the lower lobes of the lungs bilaterally. Postoperative changes of tricuspid annuloplasty and mitral valve replacement (stented bioprosthesis) are noted. Hepatobiliary: Liver has a shrunken appearance and nodular contour, suggesting underlying cirrhosis. No discrete cystic or solid hepatic lesions are confidently identified on today's noncontrast CT examination. Unenhanced appearance of the gallbladder is unremarkable. Pancreas: No definite pancreatic mass or peripancreatic fluid collections or inflammatory changes are noted on today's noncontrast CT examination. Spleen: Unremarkable. Adrenals/Urinary Tract: There are no abnormal calcifications within the collecting system of either kidney, along the course of either ureter, or within the lumen of the urinary bladder. No hydroureteronephrosis or perinephric stranding to suggest urinary tract obstruction at this time. The unenhanced appearance of the kidneys is unremarkable bilaterally. Unenhanced appearance of the urinary bladder is normal.  Bilateral adrenal glands are normal in appearance. Stomach/Bowel: Unenhanced appearance of the stomach is normal. There is no pathologic dilatation of small bowel or colon. Normal appendix. Vascular/Lymphatic: Atherosclerosis throughout the abdominal aorta and pelvic vasculature. No lymphadenopathy noted in the abdomen or pelvis. Reproductive: Prostate gland is mildly enlarged  measuring 6.6 x 4.8 cm. Other: No significant volume of ascites.  No pneumoperitoneum. Musculoskeletal: Chronic appearing compression fracture of T12 with 25% loss of anterior vertebral body height. Multilevel degenerative disc disease, most severe at L4-L5 where there is complete loss of intervertebral disc height and partial bony fusion. There are no aggressive appearing lytic or blastic lesions noted in the visualized portions of the skeleton. IMPRESSION: 1. No acute findings are noted in the abdomen or pelvis to account for the patient's symptoms. 2. Morphologic changes in the liver suggesting underlying cirrhosis. 3. Moderate right and small left pleural effusions lying dependently. 4. Prostatomegaly. 5. Aortic atherosclerosis. 6. Additional incidental findings, as above. Electronically Signed   By: Toribio Aye M.D.   On: 05/17/2023 06:13   DG Chest 2 View Result Date: 05/16/2023 CLINICAL DATA:  Shortness of breath EXAM: CHEST - 2 VIEW COMPARISON:  04/21/2019 FINDINGS: Cardiac shadow is stable. Postsurgical changes are again seen. Mild vascular congestion is noted. Small left pleural effusion is noted. No acute bony abnormality is seen. Chronic lower thoracic compression deformity is seen. IMPRESSION: Mild vascular congestion.  No other focal abnormality is noted. Electronically Signed   By: Oneil Devonshire M.D.   On: 05/16/2023 21:19      LOS: 1 day    Elgin Lam, MD [facility] 05/18/2023, 9:06 AM   If 7PM-7AM, please contact night-coverage [email or web address]

## 2023-05-19 ENCOUNTER — Telehealth (HOSPITAL_COMMUNITY): Payer: Self-pay | Admitting: Pharmacy Technician

## 2023-05-19 ENCOUNTER — Other Ambulatory Visit (HOSPITAL_COMMUNITY): Payer: Self-pay

## 2023-05-19 DIAGNOSIS — I050 Rheumatic mitral stenosis: Secondary | ICD-10-CM | POA: Diagnosis not present

## 2023-05-19 DIAGNOSIS — D696 Thrombocytopenia, unspecified: Secondary | ICD-10-CM | POA: Diagnosis not present

## 2023-05-19 DIAGNOSIS — I350 Nonrheumatic aortic (valve) stenosis: Secondary | ICD-10-CM

## 2023-05-19 DIAGNOSIS — Z952 Presence of prosthetic heart valve: Secondary | ICD-10-CM | POA: Diagnosis not present

## 2023-05-19 DIAGNOSIS — I5033 Acute on chronic diastolic (congestive) heart failure: Secondary | ICD-10-CM | POA: Diagnosis not present

## 2023-05-19 DIAGNOSIS — R7989 Other specified abnormal findings of blood chemistry: Secondary | ICD-10-CM | POA: Diagnosis not present

## 2023-05-19 DIAGNOSIS — Z8679 Personal history of other diseases of the circulatory system: Secondary | ICD-10-CM | POA: Diagnosis not present

## 2023-05-19 DIAGNOSIS — I5020 Unspecified systolic (congestive) heart failure: Secondary | ICD-10-CM | POA: Diagnosis not present

## 2023-05-19 LAB — BASIC METABOLIC PANEL
Anion gap: 11 (ref 5–15)
BUN: 22 mg/dL (ref 8–23)
CO2: 27 mmol/L (ref 22–32)
Calcium: 8.8 mg/dL — ABNORMAL LOW (ref 8.9–10.3)
Chloride: 96 mmol/L — ABNORMAL LOW (ref 98–111)
Creatinine, Ser: 0.9 mg/dL (ref 0.61–1.24)
GFR, Estimated: >60 mL/min (ref >60)
Glucose, Bld: 336 mg/dL — ABNORMAL HIGH (ref 70–99)
Potassium: 3.7 mmol/L (ref 3.5–5.1)
Sodium: 134 mmol/L — ABNORMAL LOW (ref 135–145)

## 2023-05-19 LAB — ECHOCARDIOGRAM COMPLETE
AV Mean grad: 27 mm[Hg]
AV Peak grad: 46.8 mm[Hg]
Ao pk vel: 3.42 m/s
Height: 64 in
S' Lateral: 2.9 cm
Weight: 3241.64 [oz_av]

## 2023-05-19 LAB — CBC
HCT: 42.4 % (ref 39.0–52.0)
Hemoglobin: 13.7 g/dL (ref 13.0–17.0)
MCH: 29.3 pg (ref 26.0–34.0)
MCHC: 32.3 g/dL (ref 30.0–36.0)
MCV: 90.6 fL (ref 80.0–100.0)
Platelets: 51 10*3/uL — ABNORMAL LOW (ref 150–400)
RBC: 4.68 MIL/uL (ref 4.22–5.81)
RDW: 12.4 % (ref 11.5–15.5)
WBC: 8.3 10*3/uL (ref 4.0–10.5)
nRBC: 0 % (ref 0.0–0.2)

## 2023-05-19 LAB — LIPID PANEL
Cholesterol: 168 mg/dL (ref 0–200)
HDL: 32 mg/dL — ABNORMAL LOW (ref >40)
LDL Cholesterol: 113 mg/dL — ABNORMAL HIGH (ref 0–99)
Total CHOL/HDL Ratio: 5.3 {ratio}
Triglycerides: 117 mg/dL (ref <150)
VLDL: 23 mg/dL (ref 0–40)

## 2023-05-19 LAB — PHOSPHORUS: Phosphorus: 4.5 mg/dL (ref 2.5–4.6)

## 2023-05-19 LAB — GLUCOSE, CAPILLARY
Glucose-Capillary: 254 mg/dL — ABNORMAL HIGH (ref 70–99)
Glucose-Capillary: 173 mg/dL — ABNORMAL HIGH (ref 70–99)
Glucose-Capillary: 173 mg/dL — ABNORMAL HIGH (ref 70–99)
Glucose-Capillary: 181 mg/dL — ABNORMAL HIGH (ref 70–99)

## 2023-05-19 LAB — T3: T3, Total: 93 ng/dL (ref 71–180)

## 2023-05-19 LAB — HEMOGLOBIN A1C
Hgb A1c MFr Bld: 12.3 % — ABNORMAL HIGH (ref 4.8–5.6)
Mean Plasma Glucose: 306 mg/dL

## 2023-05-19 MED ORDER — FUROSEMIDE 40 MG PO TABS
40.0000 mg | ORAL_TABLET | Freq: Every day | ORAL | Status: DC
  Administered 2023-05-20 – 2023-05-22 (×3): 40 mg via ORAL
  Filled 2023-05-19 (×3): qty 1

## 2023-05-19 MED ORDER — SODIUM CHLORIDE 0.9% FLUSH: 3.0000 mL | Freq: Two times a day (BID) | INTRAVENOUS | Status: DC

## 2023-05-19 MED ORDER — INSULIN GLARGINE-YFGN 100 UNIT/ML ~~LOC~~ SOLN
15.0000 [IU] | Freq: Every day | SUBCUTANEOUS | Status: DC
  Administered 2023-05-19 – 2023-05-20 (×2): 15 [IU] via SUBCUTANEOUS
  Filled 2023-05-19 (×2): qty 0.15

## 2023-05-19 MED ORDER — SODIUM CHLORIDE 0.9% FLUSH: 3.0000 mL | INTRAVENOUS | Status: DC | PRN

## 2023-05-19 NOTE — Telephone Encounter (Signed)
 Patient Product/process development scientist completed.    The patient is insured through [employer]. Patient has Medicare and is not eligible for a copay card, but may be able to apply for patient assistance or Medicare RX Payment Plan (Patient Must reach out to their plan, if eligible for payment plan), if available.    Ran test claim for Lantus Pen and the current 30 day co-pay is $35.00.  Ran test claim for Dexcom G7 Sensor and the current 30 day co-pay is $0.00.  Ran test claim for [employer] 3 Sensor and the current 30 day co-pay is $0.00.   This test claim was processed through [facility]- copay amounts may vary at other pharmacies due to pharmacy/plan contracts, or as the patient moves through the different stages of their insurance plan.     Roland Earl, CPHT Pharmacy Technician III Certified Patient Advocate [facility] Patient Advocate Team Direct Number: [phone]  Fax: [phone]

## 2023-05-19 NOTE — Inpatient Diabetes Management (Addendum)
 Inpatient Diabetes Program Recommendations  AACE/ADA: New Consensus Statement on Inpatient Glycemic Control (2015)  Target Ranges:  Prepandial:   less than 140 mg/dL      Peak postprandial:   less than 180 mg/dL (1-2 hours)      Critically ill patients:  140 - 180 mg/dL    Latest Reference Range & Units 05/18/23 06:18 05/18/23 11:54 05/18/23 15:59 05/18/23 21:57  Glucose-Capillary 70 - 99 mg/dL 743 (H)  5 units Novolog   10 units Semglee  @0927   316 (H)  7 units Novolog   222 (H)  3 units Novolog   170 (H)  (H): Data is abnormally high  Latest Reference Range & Units 05/19/23 06:07  Glucose-Capillary 70 - 99 mg/dL 745 (H)  5 units Novolog    (H): Data is abnormally high  Admit Acute on chronic heart failure with reduced EF/ Evidence of moderate right and small left pleural effusions/ Elevated troponin  Home DM Meds: None listed   Current Orders: Semglee  10 units daily                           Novolog  Sensitive Correction Scale/ SSI (0-9 units) TID AC + HS    Current A1c Pending Last A1c was 9.2% (May 2024) PCP:  [facility]        MD- Please consider:   1. Increase Semglee  to 15 units Daily (if 10 unit dose already given this AM, please also order Semglee  5 units X 1 dose to be given today as well)   2. Start Novolog  Meal Coverage: Novolog  4 units TID with meals HOLD if pt NPO HOLD if pt eats <50% meals   3. Note Current A1c Pending.  No meds listed at home.  Anticipate pt will need either orals and/or insulin  at time of d/c   Addendum 11am--Met w/ pt and his Dtr at bedside.  Dtr helped provide translation for me.  Pt has CBG meter at home but rarely checks.  Takes Metformin  once daily at home but unsure of dose.  Has been on Metformin  for years.  Does not drink any drinks with sugar but likely eating too many carbs at home per dtr.  [facility].  Dtr and pt told me pt would like Glucose sensor to better  check glucose levels at home.  His phone is able to handle and download the [facility] 3 app--Will ask Dtr to help pt download the app and set up an account.  Discussed w/ pt and Dtr that we have an A1c Pending--Reviewed last A1c of 9.2% with pt and Dtr and reviewed goal CBGs and goal A1c for home.  Discussed with pt and Dtr that if A1c comes back elevated that he will likely need medication adjustments for home and that he may need insulin  for home.  Contacted OP pharmacy and they let me know that Lantus  insulin  pen is $35 co-pay and that Freestyle Libre 3 plus is $0 co-pay.  I have let the Attending MD know this info and have asked if pt will need insulin  for home (given A1c resulted back around 12pm today and result was 12.3%).  Will follow back up with pt either later today or tomorrow.    Addendum 12:30pm--Went back to talk w/ pt and Dtr about pt's A1c rising to 12.3%.  Discussed with them that pt will likely need insulin  for home.  Pt and Dtr OK with  this.  Pt has used insulin  pens in the past.  Provided review of using insulin  pen with pt and Dtr and pt able to provide successful return demo of insulin  pen.  Reviewed rotation of injection sites, storage of open and unopened insulin , disposal of sharps, Hypoglycemia s/sxs and treatment, etc.  Have asked pt's Dtr to help pt download the Freestyle libre 3 app and help pt set up the app with an account.  Will bring Freestyle libre 3 sample sensors and provide sensor education closer to when pt will d/c home.  Pt and Dtr educated that sensor glucose reading lags behind CBG reading 15-20 min and that it pt's symptoms don't match sensor reading to perform fingerstick CBG.  Pt and dtr also educated to seek refills of sensors from PCP.  Will ask discharging MD to provide pt with Rxs to fill at [facility] pharmacy.    --Will follow patient during hospitalization--  Adina Rudolpho Arrow RN, MSN, CDCES Diabetes Coordinator Inpatient Glycemic  Control Team Team Pager: [phone] (8a-5p)

## 2023-05-19 NOTE — Progress Notes (Signed)
 Patient Name: Joseph Raymond Date of Encounter: 05/19/2023 [facility] Cardiologist: Joseph JONETTA Lovelace, MD   Interval Summary  .    Feeling better.  Breathing improved.   Vital Signs .    Vitals:   05/19/23 0611 05/19/23 0747 05/19/23 0758 05/19/23 0800  BP:   92/68 92/68  Pulse:   74 72  Resp:   16   Temp:  97.7 F (36.5 C) 98 F (36.7 C)   TempSrc:  Oral Oral   SpO2:   94%   Weight: 91.7 kg     Height:        Intake/Output Summary (Last 24 hours) at 05/19/2023 0902 Last data filed at 05/19/2023 0804 Gross per 24 hour  Intake 835 ml  Output 1095 ml  Net -260 ml      05/19/2023    6:11 AM 05/18/2023    4:37 AM 05/17/2023    3:00 PM  Last 3 Weights  Weight (lbs) 202 lb 2.6 oz 202 lb 9.6 oz 203 lb 4.2 oz  Weight (kg) 91.7 kg 91.9 kg 92.2 kg      Telemetry/ECG    Sinus rhythm.  No events - Personally Reviewed  Physical Exam .    VS:  BP 92/68   Pulse 72   Temp 98 F (36.7 C) (Oral)   Resp 16   Ht 5' 4 (1.626 m)   Wt 91.7 kg   SpO2 94%   BMI 34.70 kg/m  , BMI Body mass index is 34.7 kg/m. GENERAL:  Well appearing HEENT: Pupils equal round and reactive, fundi not visualized, oral mucosa unremarkable NECK:  No jugular venous distention, waveform within normal limits, carotid upstroke brisk and symmetric, no bruits, no thyromegaly LUNGS:  Clear to auscultation bilaterally HEART:  RRR.  PMI not displaced or sustained,S1 and S2 within normal limits, no S3, no S4, no clicks, no rubs, no murmurs ABD:  Flat, positive bowel sounds normal in frequency in pitch, no bruits, no rebound, no guarding, no midline pulsatile mass, no hepatomegaly, no splenomegaly EXT:  2 plus pulses throughout, no edema, no cyanosis no clubbing SKIN:  No rashes no nodules NEURO:  Cranial nerves II through XII grossly intact, motor grossly intact throughout PSYCH:  Cognitively intact, oriented to person place and time   Assessment & Plan .     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  # Acute HFpEF:  # Valvular heart disease: Echo reviewed this AM.  His systolic function is normal.  Prosthetic mitral valve is clearly thickened.  Leaflet motion is difficult to assess but gradients are clearly elevated with a mean gradient of 17 mmHg.  He has at least moderate prosthetic aortic valve stenosis.  We will plan to get a TEE tomorrow as long as his platelets are still greater than 50,000.  If this confirms severe stenosis we will then proceed with left and right heart catheterization and CT surgery consultation.  He is euvolemic on exam and sodium dipped to 134 this morning.  Likely signaling intravascular volume depletion..  Stop IV Lasix  and transition to 40 mg p.o. daily.  Informed Consent   Shared Decision Making/Informed Consent   The risks [esophageal damage, perforation (1:10,000 risk), bleeding, pharyngeal hematoma as well as other potential  complications associated with conscious sedation including aspiration, arrhythmia, respiratory failure and death], benefits (treatment guidance and diagnostic support) and alternatives of a transesophageal echocardiogram were discussed in detail with Joseph Raymond and he is willing to proceed.     # Hypertension:  Home antihypertensives has been held due to hypotension.  # DM: Uncontrolled.   # Thrombocytopenia:  Slightly improved today.  Reportedly chronic with acute worsening.  Suspected underlying cirrhoisis.    # ?Cirrhosis:  Underlying EtOH abuse.  Cirrhosis suspected on CT and seen on RUQ ultrasound.  No suggestion of varices on CT.   # Aortic atherosclerosis:  # Coronary Calcification:  # Hyperlipidemia:  LDL 113.  Will hold on  statin for now given new diagnosis of cirrhosis.  Consider low dose statin and statin alternatives.  No aspirin  given thrombocytopenia.  # Enlarged pulmonary artery: Likely elevated pulmonary pressure in the setting of L sided heart disease.  He is euvolemic as above.    For questions or updates, please contact [facility] Please consult [email or web address] for contact info under        Signed, Joseph Scarce, MD

## 2023-05-19 NOTE — Progress Notes (Addendum)
 PROGRESS NOTE    Joseph Raymond  FMW:[id number] DOB: May 11, 1961 DOA: 05/16/2023 PCP: [facility]   Brief Narrative: Joseph Raymond is a 63 y.o. male with a history of hypertension, aortic/tricuspid/mitral valve replacement, endocarditis on suppressive antibiotic therapy, paroxysmal atrial fibrillation, GI bleeding, diabetes mellitus type 2, chronic back pain.  Patient presented secondary to back pain and shortness of breath and was found to have evidence of acute heart failure.  Cardiology consulted.  Patient started on Lasix  IV.   Assessment and Plan:  Acute on chronic heart failure with reduced EF Patient with worsening dyspnea with associated orthopnea.  BNP elevated at 553.  Chest x-ray on admission significant for vascular congestion.  Patient also noted to have evidence of moderate right and small left pleural effusions. Patient started on Lasix  IV and is transitioned to Lasix  PO. -Cardiology recommendations: Transesophageal Echocardiogram to assess valvular disease  Elevated troponin Likely demand ischemia in setting of acute heart failure.  Troponin elevation mild at 31 with delta of 34.  Diabetes mellitus type 2 Uncontrolled with hyperglycemia.  Last hemoglobin A1c of 9.2% from May 2024.  Patient is on medication management as an outpatient.  Patient started on Semglee  10 units and SSI this admission. Hemoglobin A1C up to 12.3%. -Increase to Semglee  15 units and continue SSI  Thrombocytopenia Appears to be chronic with baseline platelets around 120,000 and acute worsening.  Likely related to underlying suspected cirrhosis.  Platelets of 39,000 on admission.  Slightly improved to 51,000 today.  No evidence of bleeding. Unclear reason for acute decrease. -CBC daily  Hyperphosphatemia Mild. Resolved.  Possible cirrhosis In setting of underlying alcohol abuse.  Bilirubin of 1.9.  INR 1.1.  Cirrhosis suggested on CT scan this admission and  confirmed on RUQ ultrasound.  Patient follows with gastroenterology as an outpatient but not for liver pathology.  Patient will need outpatient GI follow-up.  Chronic T12 compression deformity -analgesics as needed  Paroxysmal atrial fibrillation Note noted.  Patient currently in sinus rhythm.  Patient is not on rate or rhythm control.  Patient is not on anticoagulation secondary to history of GI bleeding.  History of aortic, tricuspid, mitral valve replacement History of endocarditis Patient is on chronic antibiotic therapy consisting of amoxicillin . -Continue amoxicillin  -Transesophageal Echocardiogram planned by cardiology, with possible follow-up left/right heart catheterization and TCTS consultation depending on results of aortic valve stenosis  GERD -Continue Protonix   Alcohol use Per patient report, he drinks 2-3 beers 3-4 times per week.  No history of alcohol withdrawal syndrome.  Patient started on CIWA protocol. CIWA scores of 0.  Aortic atherosclerosis Noted on CT imaging. LDL of 113.  Obesity, class I Estimated body mass index is 34.7 kg/m as calculated from the following:   Height as of this encounter: 5' 4 (1.626 m).   Weight as of this encounter: 91.7 kg.   DVT prophylaxis: SCDs Code Status:   Code Status: Full Code Family Communication: None at bedside Disposition Plan: Discharge home likely pending ongoing cardiology recommendation/management   Consultants:  Cardiology  Procedures:  Transthoracic Echocardiogram (pending)  Antimicrobials: None    Subjective:  Spanish interpreter: Dempsey Buffy [phone]  Continues to have improved breathing. No other concerns.  Objective: BP 92/68   Pulse 72   Temp 98 F (36.7 C) (Oral)   Resp 16   Ht 5' 4 (1.626 m)   Wt 91.7 kg   SpO2 94%   BMI 34.70 kg/m   Examination:  General exam: Appears calm  and comfortable Respiratory system: Clear to auscultation. Respiratory effort normal. Cardiovascular  system: S1 & S2 heard, RRR. Systolic murmur Gastrointestinal system: Abdomen is nondistended, soft and nontender. No organomegaly or masses felt. Normal bowel sounds heard. Central nervous system: Alert and oriented. No focal neurological deficits. Musculoskeletal: No edema. No calf tenderness Skin: No cyanosis. No rashes Psychiatry: Judgement and insight appear normal. Mood & affect appropriate.    Data Reviewed: I have personally reviewed following labs and imaging studies  CBC Lab Results  Component Value Date   WBC 8.3 05/19/2023   RBC 4.68 05/19/2023   HGB 13.7 05/19/2023   HCT 42.4 05/19/2023   MCV 90.6 05/19/2023   MCH 29.3 05/19/2023   PLT 51 (L) 05/19/2023   MCHC 32.3 05/19/2023   RDW 12.4 05/19/2023   LYMPHSABS 1.5 04/21/2023   MONOABS 0.6 04/21/2023   EOSABS 0.4 04/21/2023   BASOSABS 0.0 04/21/2023     Last metabolic panel Lab Results  Component Value Date   NA 134 (L) 05/19/2023   K 3.7 05/19/2023   CL 96 (L) 05/19/2023   CO2 27 05/19/2023   BUN 22 05/19/2023   CREATININE 0.90 05/19/2023   GLUCOSE 336 (H) 05/19/2023   GFRNONAA >60 05/19/2023   GFRAA >60 09/30/2019   CALCIUM  8.8 (L) 05/19/2023   PHOS 4.5 05/19/2023   PROT 6.9 05/17/2023   ALBUMIN 4.0 05/17/2023   BILITOT 1.9 (H) 05/17/2023   ALKPHOS 67 05/17/2023   AST 50 (H) 05/17/2023   ALT 57 (H) 05/17/2023   ANIONGAP 11 05/19/2023    GFR: Estimated Creatinine Clearance: 84.7 mL/min (by C-G formula based on SCr of 0.9 mg/dL).  Recent Results (from the past 240 hours)  MRSA Next Gen by PCR, Nasal     Status: None   Collection Time: 05/17/23  3:18 PM   Specimen: Nasal Mucosa; Nasal Swab  Result Value Ref Range Status   MRSA by PCR Next Gen NOT DETECTED NOT DETECTED Final    Comment: (NOTE) The GeneXpert MRSA Assay (FDA approved for NASAL specimens only), is one component of a comprehensive MRSA colonization surveillance program. It is not intended to diagnose MRSA infection nor to guide or  monitor treatment for MRSA infections. Test performance is not FDA approved in patients less than 40 years old. Performed at [facility], [address], [place], [place] [address]       Radiology Studies: ECHOCARDIOGRAM COMPLETE Result Date: 05/19/2023    ECHOCARDIOGRAM REPORT   Patient Name:   Joseph Raymond Date of Exam: 05/18/2023 Medical Rec #:  [id number]           Height:       64.0 in Accession #:    [id number]          Weight:       202.2 lb Date of Birth:  1961/05/07           BSA:          1.965 m Patient Age:    62 years            BP:           108/68 mmHg Patient Gender: M                   HR:           73 bpm. Exam Location:  Inpatient Procedure: 2D Echo, Cardiac Doppler and Color Doppler Indications:    MV stenosis  History:  Patient has prior history of Echocardiogram examinations, most                 recent 04/21/2023. Mitral Valve Disease and Endocarditis; Risk                 Factors:Diabetes. ETOH abuse.                 Aortic Valve: 25 mm Inspiris bovine pericardial valve is present                 in the aortic position.                 Mitral Valve: 29 mm Magna Mitral Ease bovine pericardial valve                 valve is present in the mitral position.  Sonographer:    Thea Norlander Referring Phys: Jayson Sierras MD IMPRESSIONS  1. Septal hypokinesis consistent with postoperative state. Left ventricular ejection fraction, by estimation, is 55 to 60%. The left ventricle has normal function. The left ventricle demonstrates regional wall motion abnormalities (see scoring diagram/findings for description). There is moderate concentric left ventricular hypertrophy. Left ventricular diastolic function could not be evaluated. Elevated left atrial pressure.  2. Right ventricular systolic function is moderately reduced. The right ventricular size is normal.  3. Left atrial size was moderately dilated.  4. Right atrial size was mildly dilated.  5. The mitral valve has been  repaired/replaced. No evidence of mitral valve regurgitation. Severe mitral stenosis. The mean mitral valve gradient is 16.0 mmHg with average heart rate of 53 bpm. There is a 29 mm Magna Mitral Ease bovine pericardial valve  present in the mitral position.  6. The tricuspid valve is has been repaired/replaced. The tricuspid valve is status post repair with an annuloplasty ring.  7. Prosthetic aortic valve leaflet motion is restricted. Valve area by planimetry is 0.97 cm^2. Dimensionless index 0.29 with a rounded jet contour, suggesting significant prosthetic valve stenosis. The aortic valve has been repaired/replaced. Aortic valve regurgitation is trivial. No aortic stenosis is present. There is a 25 mm Inspiris bovine pericardial valve present in the aortic position.  8. The inferior vena cava is normal in size with greater than 50% respiratory variability, suggesting right atrial pressure of 3 mmHg. FINDINGS  Left Ventricle: Septal hypokinesis consistent with postoperative state. Left ventricular ejection fraction, by estimation, is 55 to 60%. The left ventricle has normal function. The left ventricle demonstrates regional wall motion abnormalities. The left  ventricular internal cavity size was normal in size. There is moderate concentric left ventricular hypertrophy. Left ventricular diastolic function could not be evaluated due to mitral valve replacement. Left ventricular diastolic function could not be evaluated. Elevated left atrial pressure. Right Ventricle: The right ventricular size is normal. No increase in right ventricular wall thickness. Right ventricular systolic function is moderately reduced. Left Atrium: Left atrial size was moderately dilated. Right Atrium: Right atrial size was mildly dilated. Pericardium: There is no evidence of pericardial effusion. Mitral Valve: The mitral valve has been repaired/replaced. No evidence of mitral valve regurgitation. There is a 29 mm Magna Mitral Ease bovine  pericardial valve present in the mitral position. Severe mitral valve stenosis. The mean mitral valve gradient  is 16.0 mmHg with average heart rate of 53 bpm. Tricuspid Valve: The tricuspid valve is has been repaired/replaced. Tricuspid valve regurgitation is not demonstrated. No evidence of tricuspid stenosis. The tricuspid valve is status post repair with  an annuloplasty ring. Aortic Valve: Prosthetic aortic valve leaflet motion is restricted. Valve area by planimetry is 0.97 cm^2. Dimensionless index 0.29 with a rounded jet contour, suggesting significant prosthetic valve stenosis. The aortic valve has been repaired/replaced.  Aortic valve regurgitation is trivial. No aortic stenosis is present. Aortic valve mean gradient measures 27.0 mmHg. Aortic valve peak gradient measures 46.8 mmHg. There is a 25 mm Inspiris bovine pericardial valve present in the aortic position. Pulmonic Valve: The pulmonic valve was normal in structure. Pulmonic valve regurgitation is not visualized. No evidence of pulmonic stenosis. Aorta: The aortic root is normal in size and structure. Venous: The inferior vena cava is normal in size with greater than 50% respiratory variability, suggesting right atrial pressure of 3 mmHg. IAS/Shunts: No atrial level shunt detected by color flow Doppler.  LEFT VENTRICLE PLAX 2D LVIDd:         4.20 cm LVIDs:         2.90 cm LV PW:         1.51 cm LV IVS:        1.57 cm  LEFT ATRIUM           Index LA diam:      5.10 cm 2.59 cm/m LA Vol (A4C): 54.5 ml 27.73 ml/m  AORTIC VALVE AV Vmax:           342.00 cm/s AV VTI:            0.577 m AV Peak Grad:      46.8 mmHg AV Mean Grad:      27.0 mmHg LVOT Vmax:         105.00 cm/s LVOT VTI:          0.160 m LVOT/AV VTI ratio: 0.28  AORTA Ao Root diam: 3.20 cm Ao Asc diam:  3.90 cm MITRAL VALVE MV Mean grad: 16.0 mmHg SHUNTS                         Systemic VTI: 0.16 m Annabella Scarce MD Electronically signed by Annabella Scarce MD Signature Date/Time:  05/19/2023/9:52:51 AM    Final    US  Abdomen Limited RUQ (LIVER/GB) Result Date: 05/18/2023 CLINICAL DATA:  Hyperbilirubinemia, abnormal CT EXAM: ULTRASOUND ABDOMEN LIMITED RIGHT UPPER QUADRANT COMPARISON:  CT 05/17/2023 FINDINGS: Gallbladder: No gallstones or wall thickening visualized. No sonographic Murphy sign noted by sonographer. Common bile duct: Diameter: 3 mm.  No intrahepatic biliary dilation. Liver: Nodular contour and increased parenchymal echogenicity compatible with cirrhosis portal vein is patent on color Doppler imaging with normal direction of blood flow towards the liver. Other: None. IMPRESSION: No evidence of acute cholecystitis.  No biliary dilation. Cirrhosis. Electronically Signed   By: Norman Gatlin M.D.   On: 05/18/2023 21:39   DG Thoracic Spine 2 View Result Date: 05/18/2023 CLINICAL DATA:  Spine pain EXAM: THORACIC SPINE 2 VIEWS COMPARISON:  Chest CT 04/21/2023 FINDINGS: Sternotomy and valve prostheses. Chronic T12 compression deformity. Other vertebra demonstrate normal stature. Moderate multilevel degenerative osteophytes. Alignment is within normal limits. IMPRESSION: Chronic T12 compression deformity. Moderate multilevel degenerative changes. Electronically Signed   By: Luke Bun M.D.   On: 05/18/2023 16:13      LOS: 2 days    Elgin Lam, MD [facility] 05/19/2023, 11:25 AM   If 7PM-7AM, please contact night-coverage [email or web address]

## 2023-05-19 NOTE — Plan of Care (Signed)
  Problem: Fluid Volume: Goal: Ability to maintain a balanced intake and output will improve Outcome: Progressing   Problem: Metabolic: Goal: Ability to maintain appropriate glucose levels will improve Outcome: Progressing   Problem: Nutritional: Goal: Maintenance of adequate nutrition will improve Outcome: Progressing   Problem: Education: Goal: Knowledge of General Education information will improve Description: Including pain rating scale, medication(s)/side effects and non-pharmacologic comfort measures Outcome: Progressing   Problem: Clinical Measurements: Goal: Respiratory complications will improve Outcome: Progressing Goal: Cardiovascular complication will be avoided Outcome: Progressing   Problem: Activity: Goal: Risk for activity intolerance will decrease Outcome: Progressing   Problem: Nutrition: Goal: Adequate nutrition will be maintained Outcome: Progressing   Problem: Elimination: Goal: Will not experience complications related to bowel motility Outcome: Progressing Goal: Will not experience complications related to urinary retention Outcome: Progressing   Problem: Pain Management: Goal: General experience of comfort will improve Outcome: Progressing   Problem: Safety: Goal: Ability to remain free from injury will improve Outcome: Progressing   Problem: Skin Integrity: Goal: Risk for impaired skin integrity will decrease Outcome: Progressing

## 2023-05-19 NOTE — TOC Progression Note (Signed)
 Transition of Care [facility]) - Progression Note    Patient Details  Name: Joseph Raymond MRN: [id number] Date of Birth: 1960/08/19  [facility] of Care [facility]) CM/SW Contact  Nola Devere Hands, RN Phone Number: 05/19/2023, 1:05 PM  Clinical Narrative:    Case Manager spoke with patient's daughter, [name], who states she makes appointments and handles things for her dad. She understands recommendation for Outpatient therapy. Cm explained that they will receive call from [facility] outpatient therapy to schedule outpatient therapy appointment. [name] states they would like Alcohol cessation information, Case Manager has attached to AVS. No further needs identified.    Expected Discharge Plan: Home/Self Care Barriers to Discharge: No Barriers Identified  Expected Discharge Plan and Services   Discharge Planning Services: CM Consult Post Acute Care Choice:  (outpatient therapy) Living arrangements for the past 2 months: Single Family Home                 DME Arranged: N/A DME Agency: NA       HH Arranged: NA           Social Determinants of Health (SDOH) Interventions SDOH Screenings   Food Insecurity: No Food Insecurity (05/17/2023)  Housing: Low Risk  (05/17/2023)  Transportation Needs: No Transportation Needs (05/17/2023)  Utilities: Not At Risk (05/17/2023)  Tobacco Use: Low Risk  (05/16/2023)    Readmission Risk Interventions     No data to display

## 2023-05-19 NOTE — Progress Notes (Signed)
 Heart Failure Navigator Progress Note  Assessed for Heart & Vascular TOC clinic readiness.  Patient does not meet criteria due to being a [facility] cardiology patient. .   Navigator will sign off at this time.   Stephane Haddock, BSN, Scientist, Clinical (histocompatibility And Immunogenetics) Only

## 2023-05-19 NOTE — Plan of Care (Signed)
  Problem: Education: Goal: Ability to describe self-care measures that may prevent or decrease complications (Diabetes Survival Skills Education) will improve Outcome: Progressing Goal: Individualized Educational Video(s) Outcome: Progressing   Problem: Coping: Goal: Ability to adjust to condition or change in health will improve Outcome: Progressing   Problem: Fluid Volume: Goal: Ability to maintain a balanced intake and output will improve Outcome: Progressing   Problem: Health Behavior/Discharge Planning: Goal: Ability to identify and utilize available resources and services will improve Outcome: Progressing Goal: Ability to manage health-related needs will improve Outcome: Progressing   Problem: Metabolic: Goal: Ability to maintain appropriate glucose levels will improve Outcome: Progressing   Problem: Nutritional: Goal: Maintenance of adequate nutrition will improve Outcome: Progressing Goal: Progress toward achieving an optimal weight will improve Outcome: Progressing   Problem: Skin Integrity: Goal: Risk for impaired skin integrity will decrease Outcome: Progressing   Problem: Tissue Perfusion: Goal: Adequacy of tissue perfusion will improve Outcome: Progressing   Problem: Education: Goal: Knowledge of General Education information will improve Description: Including pain rating scale, medication(s)/side effects and non-pharmacologic comfort measures Outcome: Progressing   Problem: Health Behavior/Discharge Planning: Goal: Ability to manage health-related needs will improve Outcome: Progressing   Problem: Clinical Measurements: Goal: Ability to maintain clinical measurements within normal limits will improve Outcome: Progressing Goal: Will remain free from infection Outcome: Progressing Goal: Diagnostic test results will improve Outcome: Progressing Goal: Respiratory complications will improve Outcome: Progressing Goal: Cardiovascular complication will  be avoided Outcome: Progressing   Problem: Activity: Goal: Risk for activity intolerance will decrease Outcome: Progressing   Problem: Nutrition: Goal: Adequate nutrition will be maintained Outcome: Progressing   Problem: Coping: Goal: Level of anxiety will decrease Outcome: Progressing   Problem: Elimination: Goal: Will not experience complications related to bowel motility Outcome: Progressing Goal: Will not experience complications related to urinary retention Outcome: Progressing   Problem: Pain Management: Goal: General experience of comfort will improve Outcome: Progressing

## 2023-05-20 ENCOUNTER — Inpatient Hospital Stay (HOSPITAL_COMMUNITY): Payer: Medicare HMO | Admitting: Anesthesiology

## 2023-05-20 ENCOUNTER — Encounter (HOSPITAL_COMMUNITY)
Admission: EM | Disposition: A | Discharge status: Home / Self Care | Payer: Self-pay | Source: Home / Self Care | Attending: Family Medicine

## 2023-05-20 ENCOUNTER — Inpatient Hospital Stay (HOSPITAL_COMMUNITY): Payer: Medicare HMO

## 2023-05-20 DIAGNOSIS — I340 Nonrheumatic mitral (valve) insufficiency: Secondary | ICD-10-CM | POA: Diagnosis not present

## 2023-05-20 DIAGNOSIS — I5020 Unspecified systolic (congestive) heart failure: Secondary | ICD-10-CM | POA: Diagnosis not present

## 2023-05-20 DIAGNOSIS — I350 Nonrheumatic aortic (valve) stenosis: Secondary | ICD-10-CM | POA: Diagnosis not present

## 2023-05-20 DIAGNOSIS — R7989 Other specified abnormal findings of blood chemistry: Secondary | ICD-10-CM | POA: Diagnosis not present

## 2023-05-20 DIAGNOSIS — E119 Type 2 diabetes mellitus without complications: Secondary | ICD-10-CM | POA: Diagnosis not present

## 2023-05-20 DIAGNOSIS — I509 Heart failure, unspecified: Secondary | ICD-10-CM | POA: Diagnosis not present

## 2023-05-20 DIAGNOSIS — I342 Nonrheumatic mitral (valve) stenosis: Secondary | ICD-10-CM | POA: Diagnosis not present

## 2023-05-20 DIAGNOSIS — I110 Hypertensive heart disease with heart failure: Secondary | ICD-10-CM | POA: Diagnosis not present

## 2023-05-20 DIAGNOSIS — Z8679 Personal history of other diseases of the circulatory system: Secondary | ICD-10-CM | POA: Diagnosis not present

## 2023-05-20 DIAGNOSIS — D696 Thrombocytopenia, unspecified: Secondary | ICD-10-CM | POA: Diagnosis not present

## 2023-05-20 HISTORY — PX: TRANSESOPHAGEAL ECHOCARDIOGRAM (CATH LAB): EP1270

## 2023-05-20 LAB — BASIC METABOLIC PANEL
Anion gap: 9 (ref 5–15)
BUN: 18 mg/dL (ref 8–23)
CO2: 29 mmol/L (ref 22–32)
Calcium: 8.7 mg/dL — ABNORMAL LOW (ref 8.9–10.3)
Chloride: 98 mmol/L (ref 98–111)
Creatinine, Ser: 0.69 mg/dL (ref 0.61–1.24)
GFR, Estimated: >60 mL/min (ref >60)
Glucose, Bld: 294 mg/dL — ABNORMAL HIGH (ref 70–99)
Potassium: 3.7 mmol/L (ref 3.5–5.1)
Sodium: 136 mmol/L (ref 135–145)

## 2023-05-20 LAB — GLUCOSE, CAPILLARY
Glucose-Capillary: 226 mg/dL — ABNORMAL HIGH (ref 70–99)
Glucose-Capillary: 152 mg/dL — ABNORMAL HIGH (ref 70–99)
Glucose-Capillary: 164 mg/dL — ABNORMAL HIGH (ref 70–99)
Glucose-Capillary: 277 mg/dL — ABNORMAL HIGH (ref 70–99)
Glucose-Capillary: 309 mg/dL — ABNORMAL HIGH (ref 70–99)
Glucose-Capillary: 178 mg/dL — ABNORMAL HIGH (ref 70–99)

## 2023-05-20 LAB — ECHO TEE
AR max vel: 1.23 cm2
AV Area VTI: 1.31 cm2
AV Area mean vel: 1.35 cm2
AV Mean grad: 22.5 mm[Hg]
AV Peak grad: 34.1 mm[Hg]
Ao pk vel: 2.92 m/s
Area-P 1/2: 1.52 cm2
MV VTI: 0.65 cm2

## 2023-05-20 LAB — CBC
HCT: 42.4 % (ref 39.0–52.0)
Hemoglobin: 13.7 g/dL (ref 13.0–17.0)
MCH: 29.1 pg (ref 26.0–34.0)
MCHC: 32.3 g/dL (ref 30.0–36.0)
MCV: 90.2 fL (ref 80.0–100.0)
Platelets: 56 10*3/uL — ABNORMAL LOW (ref 150–400)
RBC: 4.7 MIL/uL (ref 4.22–5.81)
RDW: 12.4 % (ref 11.5–15.5)
WBC: 6.8 10*3/uL (ref 4.0–10.5)
nRBC: 0 % (ref 0.0–0.2)

## 2023-05-20 SURGERY — TRANSESOPHAGEAL ECHOCARDIOGRAM (TEE) (CATHLAB)
Anesthesia: Monitor Anesthesia Care

## 2023-05-20 MED ORDER — PROPOFOL 10 MG/ML IV BOLUS
INTRAVENOUS | Status: DC | PRN
  Administered 2023-05-20: 30 mg via INTRAVENOUS
  Administered 2023-05-20 (×2): 20 mg via INTRAVENOUS

## 2023-05-20 MED ORDER — GLYCOPYRROLATE PF 0.2 MG/ML IJ SOSY
PREFILLED_SYRINGE | INTRAMUSCULAR | Status: DC | PRN
  Administered 2023-05-20: .2 mg via INTRAVENOUS

## 2023-05-20 MED ORDER — PHENYLEPHRINE 80 MCG/ML (10ML) SYRINGE FOR IV PUSH (FOR BLOOD PRESSURE SUPPORT)
PREFILLED_SYRINGE | INTRAVENOUS | Status: DC | PRN
  Administered 2023-05-20 (×2): 80 ug via INTRAVENOUS

## 2023-05-20 MED ORDER — SODIUM CHLORIDE 0.9 % IV SOLN
INTRAVENOUS | Status: DC | PRN
Start: 2023-05-20 — End: 2023-05-20

## 2023-05-20 MED ORDER — LIDOCAINE 2% (20 MG/ML) 5 ML SYRINGE
INTRAMUSCULAR | Status: DC | PRN
  Administered 2023-05-20: 100 mg via INTRAVENOUS

## 2023-05-20 MED ORDER — PROPOFOL 500 MG/50ML IV EMUL
INTRAVENOUS | Status: DC | PRN
Start: 2023-05-20 — End: 2023-05-20
  Administered 2023-05-20: 60 ug/kg/min via INTRAVENOUS

## 2023-05-20 MED ORDER — ASPIRIN 81 MG PO CHEW
81.0000 mg | CHEWABLE_TABLET | ORAL | Status: AC
Start: 2023-05-21 — End: 2023-05-22
  Administered 2023-05-21: 81 mg via ORAL
  Filled 2023-05-20: qty 1

## 2023-05-20 NOTE — Progress Notes (Signed)
 Mobility Specialist Progress Note:    05/20/23 0931  Therapy Vitals  Temp 97.8 F (36.6 C)  Temp Source Temporal  Pulse Rate 73  Resp 18  BP 114/84  Oxygen Therapy  SpO2 94 %  O2 Device Room Air  Mobility  Activity Ambulated independently in hallway  Level of Assistance Standby assist, set-up cues, supervision of patient - no hands on  Assistive Device None  Distance Ambulated (ft) 360 ft  Activity Response Tolerated well  Mobility Referral Yes  Mobility visit 1 Mobility  Mobility Specialist Start Time (ACUTE ONLY) 0848  Mobility Specialist Stop Time (ACUTE ONLY) 0853  Mobility Specialist Time Calculation (min) (ACUTE ONLY) 5 min   Received pt on EOB having no complaints and agreeable to mobility. Pt was asymptomatic throughout ambulation and returned to room w/o fault. Left on EOB w/ call bell in reach and all needs met.   D'Vante Nicholaus Mobility Specialist Please contact via Special Educational Needs Teacher or Rehab office at [phone]

## 2023-05-20 NOTE — Plan of Care (Signed)
  Problem: Coping: Goal: Ability to adjust to condition or change in health will improve Outcome: Progressing   Problem: Fluid Volume: Goal: Ability to maintain a balanced intake and output will improve Outcome: Progressing   Problem: Health Behavior/Discharge Planning: Goal: Ability to identify and utilize available resources and services will improve Outcome: Progressing Goal: Ability to manage health-related needs will improve Outcome: Progressing   Problem: Education: Goal: Knowledge of General Education information will improve Description: Including pain rating scale, medication(s)/side effects and non-pharmacologic comfort measures Outcome: Progressing   Problem: Clinical Measurements: Goal: Will remain free from infection Outcome: Progressing   Problem: Pain Management: Goal: General experience of comfort will improve Outcome: Progressing   Problem: Safety: Goal: Ability to remain free from injury will improve Outcome: Progressing

## 2023-05-20 NOTE — Anesthesia Preprocedure Evaluation (Addendum)
 Anesthesia Evaluation  Patient identified by MRN, date of birth, ID band Patient awake    Reviewed: Allergy & Precautions, NPO status , Patient's Chart, lab work & pertinent test results  Airway Mallampati: I  TM Distance: >3 FB Neck ROM: Full    Dental  (+) Dental Advisory Given, Chipped,    Pulmonary neg pulmonary ROS   Pulmonary exam normal breath sounds clear to auscultation       Cardiovascular hypertension, Pt. on medications +CHF  Normal cardiovascular exam+ Valvular Problems/Murmurs (MS) AS  Rhythm:Regular Rate:Normal  TTE 2025 1. Septal hypokinesis consistent with postoperative state. Left  ventricular ejection fraction, by estimation, is 55 to 60%. The left  ventricle has normal function. The left ventricle demonstrates regional  wall motion abnormalities (see scoring  diagram/findings for description). There is moderate concentric left  ventricular hypertrophy. Left ventricular diastolic function could not be  evaluated. Elevated left atrial pressure.   2. Right ventricular systolic function is moderately reduced. The right  ventricular size is normal.   3. Left atrial size was moderately dilated.   4. Right atrial size was mildly dilated.   5. The mitral valve has been repaired/replaced. No evidence of mitral  valve regurgitation. Severe mitral stenosis. The mean mitral valve  gradient is 16.0 mmHg with average heart rate of 53 bpm. There is a 29 mm  Magna Mitral Ease bovine pericardial valve   present in the mitral position.   6. The tricuspid valve is has been repaired/replaced. The tricuspid valve  is status post repair with an annuloplasty ring.   7. Prosthetic aortic valve leaflet motion is restricted. Valve area by  planimetry is 0.97 cm^2. Dimensionless index 0.29 with a rounded jet  contour, suggesting significant prosthetic valve stenosis. The aortic  valve has been repaired/replaced. Aortic  valve  regurgitation is trivial. No aortic stenosis is present. There is a  25 mm Inspiris bovine pericardial valve present in the aortic position.   8. The inferior vena cava is normal in size with greater than 50%  respiratory variability, suggesting right atrial pressure of 3 mmHg.     Neuro/Psych negative neurological ROS  negative psych ROS   GI/Hepatic negative GI ROS,,,(+) Cirrhosis     substance abuse  alcohol use  Endo/Other  diabetes, Type 2    Renal/GU negative Renal ROS  negative genitourinary   Musculoskeletal negative musculoskeletal ROS (+)    Abdominal   Peds  Hematology negative hematology ROS (+) Lab Results      Component                Value               Date                      WBC                      6.8                 05/20/2023                HGB                      13.7                05/20/2023                HCT  42.4                05/20/2023                MCV                      90.2                05/20/2023                PLT                      56 (L)              05/20/2023              Anesthesia Other Findings 30M with HFpEF, severe LVH, prior endocarditis in 2018 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, mild RV dysfunction, s/p AVR with prosthetic aortic valve stenosis, thrombocytopenia, atrial fibrillation/flutter s/p ablation, and diabetes admitted with acute on chronic HFpEF and valvular heart failure  Reproductive/Obstetrics                             Anesthesia Physical Anesthesia Plan  ASA: 4  Anesthesia Plan: MAC   Post-op Pain Management:    Induction: Intravenous  PONV Risk Score and Plan: Propofol  infusion and Treatment may vary due to age or medical condition  Airway Management Planned: Natural Airway  Additional Equipment:   Intra-op Plan:   Post-operative Plan:   Informed Consent: I have reviewed the patients History and Physical,  chart, labs and discussed the procedure including the risks, benefits and alternatives for the proposed anesthesia with the patient or authorized representative who has indicated his/her understanding and acceptance.     Dental advisory given  Plan Discussed with: CRNA  Anesthesia Plan Comments:        Anesthesia Quick Evaluation

## 2023-05-20 NOTE — Transfer of Care (Signed)
 Immediate Anesthesia Transfer of Care Note  Patient: Joseph Raymond  Procedure(s) Performed: TRANSESOPHAGEAL ECHOCARDIOGRAM  Patient Location: PACU  Anesthesia Type:MAC  Level of Consciousness: drowsy  Airway & Oxygen Therapy: Patient Spontanous Breathing and Patient connected to face mask oxygen  Post-op Assessment: Report given to RN and Post -op Vital signs reviewed and stable  Post vital signs: Reviewed and stable  Last Vitals:  Vitals Value Taken Time  BP    Temp 36.7 C 05/20/23 1118  Pulse 96 05/20/23 1119  Resp 27 05/20/23 1119  SpO2 92 % 05/20/23 1119  Vitals shown include unfiled device data.  Last Pain:  Vitals:   05/20/23 1118  TempSrc: Temporal  PainSc: 0-No pain      Patients Stated Pain Goal: 0 (05/18/23 2138)  Complications: No notable events documented.

## 2023-05-20 NOTE — Inpatient Diabetes Management (Addendum)
 Inpatient Diabetes Program Recommendations  AACE/ADA: New Consensus Statement on Inpatient Glycemic Control (2015)  Target Ranges:  Prepandial:   less than 140 mg/dL      Peak postprandial:   less than 180 mg/dL (1-2 hours)      Critically ill patients:  140 - 180 mg/dL    Latest Reference Range & Units 10/07/22 09:16 05/17/23 16:26  Hemoglobin A1C 4.8 - 5.6 % 9.2 (H) 12.3 (H)  306 mg/dl  (H): Data is abnormally high  Latest Reference Range & Units 05/19/23 06:07 05/19/23 11:39 05/19/23 16:00 05/19/23 21:14  Glucose-Capillary 70 - 99 mg/dL 745 (H)  5 units Novolog    173 (H)  2 units Novolog   15 units Semglee  @0959   173 (H)  2 units Novolog   181 (H)  (H): Data is abnormally high  Latest Reference Range & Units 05/20/23 06:08  Glucose-Capillary 70 - 99 mg/dL 773 (H)  3 units Novolog    (H): Data is abnormally high   Home DM Meds: None listed   Current Orders: Semglee  15 units daily                           Novolog  Sensitive Correction Scale/ SSI (0-9 units) TID AC + HS       Current A1c Pending Last A1c was 9.2% (May 2024) PCP:  [facility]    MD- Please consider increasing Semglee  to 18 units Daily (AM CBG today 226)  Please consider starting Lantus  once daily for home and Continue home Metformin  dose  Lantus  will be $35 co-pay--pt gets meds at [facility] pharmacy  Please also give pt 2 refills on the [facility] 3 Plus glucose sensors.  Pt will need to get further refills thru his PCP office. Order Number: [id number]    Addendum 1pm--Met w/ pt and Granddaughter (Gdtr).  Discussed w/ Gdtr that pt and his Dtr (Gdtr's aunt) requested Glucose sensors for pt to check glucose at home.  MD gave me permission to give pt 2 sample FSL3 sensors.  I discussed with pt's Dtr yesterday how CGM differs from fingerstick CBG.  I gave the Gdtr 2 sensors and explained how they work, wear time, basic explanation of how to apply the sensor,  rotation of sites for the sensors.  Asked Gdtr to help pt download the FSL3 app to his phone and set up an account for the app--If unable to set up an account, Told Gdtr they can ask PCP office to prescribe Reader for the [facility] sensor.  I also explained to Gdtr that they can watch Video to re-review how to apply the sensor if they forget when they get home.       --Will follow patient during hospitalization--  Adina Rudolpho Arrow RN, MSN, CDCES Diabetes Coordinator Inpatient Glycemic Control Team Team Pager: [phone] (8a-5p)

## 2023-05-20 NOTE — Plan of Care (Signed)
  Problem: Education: Goal: Ability to describe self-care measures that may prevent or decrease complications (Diabetes Survival Skills Education) will improve Outcome: Progressing Goal: Individualized Educational Video(s) Outcome: Progressing   Problem: Coping: Goal: Ability to adjust to condition or change in health will improve Outcome: Progressing   Problem: Fluid Volume: Goal: Ability to maintain a balanced intake and output will improve Outcome: Progressing   Problem: Health Behavior/Discharge Planning: Goal: Ability to identify and utilize available resources and services will improve Outcome: Progressing Goal: Ability to manage health-related needs will improve Outcome: Progressing   Problem: Metabolic: Goal: Ability to maintain appropriate glucose levels will improve Outcome: Progressing   Problem: Nutritional: Goal: Maintenance of adequate nutrition will improve Outcome: Progressing Goal: Progress toward achieving an optimal weight will improve Outcome: Progressing   Problem: Skin Integrity: Goal: Risk for impaired skin integrity will decrease Outcome: Progressing   Problem: Tissue Perfusion: Goal: Adequacy of tissue perfusion will improve Outcome: Progressing   Problem: Education: Goal: Knowledge of General Education information will improve Description: Including pain rating scale, medication(s)/side effects and non-pharmacologic comfort measures Outcome: Progressing   Problem: Health Behavior/Discharge Planning: Goal: Ability to manage health-related needs will improve Outcome: Progressing   Problem: Clinical Measurements: Goal: Ability to maintain clinical measurements within normal limits will improve Outcome: Progressing Goal: Will remain free from infection Outcome: Progressing Goal: Diagnostic test results will improve Outcome: Progressing Goal: Respiratory complications will improve Outcome: Progressing Goal: Cardiovascular complication will  be avoided Outcome: Progressing   Problem: Activity: Goal: Risk for activity intolerance will decrease Outcome: Progressing   Problem: Nutrition: Goal: Adequate nutrition will be maintained Outcome: Progressing   Problem: Coping: Goal: Level of anxiety will decrease Outcome: Progressing   Problem: Elimination: Goal: Will not experience complications related to bowel motility Outcome: Progressing Goal: Will not experience complications related to urinary retention Outcome: Progressing

## 2023-05-20 NOTE — Progress Notes (Signed)
 PROGRESS NOTE    Joseph Raymond  FMW:[id number] DOB: Oct 02, 1960 DOA: 05/16/2023 PCP: [facility]   Brief Narrative: Joseph Raymond is a 63 y.o. male with a history of hypertension, aortic/tricuspid/mitral valve replacement, endocarditis on suppressive antibiotic therapy, paroxysmal atrial fibrillation, GI bleeding, diabetes mellitus type 2, chronic back pain.  Patient presented secondary to back pain and shortness of breath and was found to have evidence of acute heart failure.  Cardiology consulted.  Patient started on Lasix  IV.   Assessment and Plan:  Acute on chronic heart failure with reduced EF Patient with worsening dyspnea with associated orthopnea.  BNP elevated at 553.  Chest x-ray on admission significant for vascular congestion.  Patient also noted to have evidence of moderate right and small left pleural effusions. Patient started on Lasix  IV and is transitioned to Lasix  PO. -Cardiology recommendations: Transesophageal Echocardiogram today to assess valvular disease  Elevated troponin Likely demand ischemia in setting of acute heart failure.  Troponin elevation mild at 31 with delta of 34.  Diabetes mellitus type 2 Uncontrolled with hyperglycemia.  Last hemoglobin A1c of 9.2% from May 2024.  Patient is on medication management as an outpatient.  Patient started on Semglee  10 units and SSI this admission. Hemoglobin A1C up to 12.3%. -Continue Semglee  15 units and continue SSI  Thrombocytopenia Appears to be chronic with baseline platelets around 120,000 and acute worsening.  Likely related to underlying suspected cirrhosis.  Platelets of 39,000 on admission.  Slightly improved to 56,000 today.  No evidence of bleeding. Unclear reason for acute decrease. -CBC daily  Hyperphosphatemia Mild. Resolved.  Possible cirrhosis In setting of underlying alcohol abuse.  Bilirubin of 1.9.  INR 1.1.  Cirrhosis suggested on CT scan this admission and  confirmed on RUQ ultrasound.  Patient follows with gastroenterology as an outpatient but not for liver pathology.  Patient will need outpatient GI follow-up.  Chronic T12 compression deformity -Analgesics as needed -PT recommending outpatient PT  Paroxysmal atrial fibrillation Note noted.  Patient currently in sinus rhythm.  Patient is not on rate or rhythm control.  Patient is not on anticoagulation secondary to history of GI bleeding.  History of aortic, tricuspid, mitral valve replacement History of endocarditis Patient is on chronic antibiotic therapy consisting of amoxicillin . -Continue amoxicillin  -Transesophageal Echocardiogram planned by cardiology, with possible follow-up left/right heart catheterization and TCTS consultation depending on results of aortic valve stenosis  GERD -Continue Protonix   Alcohol use Per patient report, he drinks 2-3 beers 3-4 times per week.  No history of alcohol withdrawal syndrome.  Patient started on CIWA protocol. CIWA scores of 0.  Aortic atherosclerosis Noted on CT imaging. LDL of 113.  Obesity, class I Estimated body mass index is 34.81 kg/m as calculated from the following:   Height as of this encounter: 5' 4 (1.626 m).   Weight as of this encounter: 92 kg.   DVT prophylaxis: SCDs Code Status:   Code Status: Full Code Family Communication: None at bedside Disposition Plan: Discharge home likely pending ongoing cardiology recommendation/management   Consultants:  Cardiology  Procedures:  Transthoracic Echocardiogram (pending)  Antimicrobials: None    Subjective:  Spanish interpreter: Joseph Raymond [phone]  Feels well. No dyspnea. No chest pain.   Objective: BP 112/64 (BP Location: Left Arm)   Pulse 73   Temp 98.2 F (36.8 C) (Oral)   Resp 14   Ht 5' 4 (1.626 m)   Wt 92 kg   SpO2 96%   BMI 34.81 kg/m  Examination:  General exam: Appears calm and comfortable Respiratory system: Clear to auscultation. Respiratory  effort normal. Cardiovascular system: S1 & S2 heard, RRR. 2/6 systolic Gastrointestinal system: Abdomen is nondistended, soft and nontender. Normal bowel sounds heard. Central nervous system: Alert and oriented. No focal neurological deficits. Musculoskeletal: No edema. No calf tenderness Psychiatry: Judgement and insight appear normal. Mood & affect appropriate.    Data Reviewed: I have personally reviewed following labs and imaging studies  CBC Lab Results  Component Value Date   WBC 6.8 05/20/2023   RBC 4.70 05/20/2023   HGB 13.7 05/20/2023   HCT 42.4 05/20/2023   MCV 90.2 05/20/2023   MCH 29.1 05/20/2023   PLT 56 (L) 05/20/2023   MCHC 32.3 05/20/2023   RDW 12.4 05/20/2023   LYMPHSABS 1.5 04/21/2023   MONOABS 0.6 04/21/2023   EOSABS 0.4 04/21/2023   BASOSABS 0.0 04/21/2023     Last metabolic panel Lab Results  Component Value Date   NA 136 05/20/2023   K 3.7 05/20/2023   CL 98 05/20/2023   CO2 29 05/20/2023   BUN 18 05/20/2023   CREATININE 0.69 05/20/2023   GLUCOSE 294 (H) 05/20/2023   GFRNONAA >60 05/20/2023   GFRAA >60 09/30/2019   CALCIUM  8.7 (L) 05/20/2023   PHOS 4.5 05/19/2023   PROT 6.9 05/17/2023   ALBUMIN 4.0 05/17/2023   BILITOT 1.9 (H) 05/17/2023   ALKPHOS 67 05/17/2023   AST 50 (H) 05/17/2023   ALT 57 (H) 05/17/2023   ANIONGAP 9 05/20/2023    GFR: Estimated Creatinine Clearance: 95.4 mL/min (by C-G formula based on SCr of 0.69 mg/dL).  Recent Results (from the past 240 hours)  MRSA Next Gen by PCR, Nasal     Status: None   Collection Time: 05/17/23  3:18 PM   Specimen: Nasal Mucosa; Nasal Swab  Result Value Ref Range Status   MRSA by PCR Next Gen NOT DETECTED NOT DETECTED Final    Comment: (NOTE) The GeneXpert MRSA Assay (FDA approved for NASAL specimens only), is one component of a comprehensive MRSA colonization surveillance program. It is not intended to diagnose MRSA infection nor to guide or monitor treatment for MRSA  infections. Test performance is not FDA approved in patients less than 72 years old. Performed at [facility], [address] [place], [place] [address]       Radiology Studies: ECHOCARDIOGRAM COMPLETE Result Date: 05/19/2023    ECHOCARDIOGRAM REPORT   Patient Name:   Gery Sabedra Date of Exam: 05/18/2023 Medical Rec #:  [id number]           Height:       64.0 in Accession #:    [id number]          Weight:       202.2 lb Date of Birth:  January 24, 1961           BSA:          1.965 m Patient Age:    62 years            BP:           108/68 mmHg Patient Gender: M                   HR:           73 bpm. Exam Location:  Inpatient Procedure: 2D Echo, Cardiac Doppler and Color Doppler Indications:    MV stenosis  History:        Patient has  prior history of Echocardiogram examinations, most                 recent 04/21/2023. Mitral Valve Disease and Endocarditis; Risk                 Factors:Diabetes. ETOH abuse.                 Aortic Valve: 25 mm Inspiris bovine pericardial valve is present                 in the aortic position.                 Mitral Valve: 29 mm Magna Mitral Ease bovine pericardial valve                 valve is present in the mitral position.  Sonographer:    Thea Norlander Referring Phys: Jayson Sierras MD IMPRESSIONS  1. Septal hypokinesis consistent with postoperative state. Left ventricular ejection fraction, by estimation, is 55 to 60%. The left ventricle has normal function. The left ventricle demonstrates regional wall motion abnormalities (see scoring diagram/findings for description). There is moderate concentric left ventricular hypertrophy. Left ventricular diastolic function could not be evaluated. Elevated left atrial pressure.  2. Right ventricular systolic function is moderately reduced. The right ventricular size is normal.  3. Left atrial size was moderately dilated.  4. Right atrial size was mildly dilated.  5. The mitral valve has been repaired/replaced. No  evidence of mitral valve regurgitation. Severe mitral stenosis. The mean mitral valve gradient is 16.0 mmHg with average heart rate of 53 bpm. There is a 29 mm Magna Mitral Ease bovine pericardial valve  present in the mitral position.  6. The tricuspid valve is has been repaired/replaced. The tricuspid valve is status post repair with an annuloplasty ring.  7. Prosthetic aortic valve leaflet motion is restricted. Valve area by planimetry is 0.97 cm^2. Dimensionless index 0.29 with a rounded jet contour, suggesting significant prosthetic valve stenosis. The aortic valve has been repaired/replaced. Aortic valve regurgitation is trivial. No aortic stenosis is present. There is a 25 mm Inspiris bovine pericardial valve present in the aortic position.  8. The inferior vena cava is normal in size with greater than 50% respiratory variability, suggesting right atrial pressure of 3 mmHg. FINDINGS  Left Ventricle: Septal hypokinesis consistent with postoperative state. Left ventricular ejection fraction, by estimation, is 55 to 60%. The left ventricle has normal function. The left ventricle demonstrates regional wall motion abnormalities. The left  ventricular internal cavity size was normal in size. There is moderate concentric left ventricular hypertrophy. Left ventricular diastolic function could not be evaluated due to mitral valve replacement. Left ventricular diastolic function could not be evaluated. Elevated left atrial pressure. Right Ventricle: The right ventricular size is normal. No increase in right ventricular wall thickness. Right ventricular systolic function is moderately reduced. Left Atrium: Left atrial size was moderately dilated. Right Atrium: Right atrial size was mildly dilated. Pericardium: There is no evidence of pericardial effusion. Mitral Valve: The mitral valve has been repaired/replaced. No evidence of mitral valve regurgitation. There is a 29 mm Magna Mitral Ease bovine pericardial valve  present in the mitral position. Severe mitral valve stenosis. The mean mitral valve gradient  is 16.0 mmHg with average heart rate of 53 bpm. Tricuspid Valve: The tricuspid valve is has been repaired/replaced. Tricuspid valve regurgitation is not demonstrated. No evidence of tricuspid stenosis. The tricuspid valve is status post repair with an annuloplasty  ring. Aortic Valve: Prosthetic aortic valve leaflet motion is restricted. Valve area by planimetry is 0.97 cm^2. Dimensionless index 0.29 with a rounded jet contour, suggesting significant prosthetic valve stenosis. The aortic valve has been repaired/replaced.  Aortic valve regurgitation is trivial. No aortic stenosis is present. Aortic valve mean gradient measures 27.0 mmHg. Aortic valve peak gradient measures 46.8 mmHg. There is a 25 mm Inspiris bovine pericardial valve present in the aortic position. Pulmonic Valve: The pulmonic valve was normal in structure. Pulmonic valve regurgitation is not visualized. No evidence of pulmonic stenosis. Aorta: The aortic root is normal in size and structure. Venous: The inferior vena cava is normal in size with greater than 50% respiratory variability, suggesting right atrial pressure of 3 mmHg. IAS/Shunts: No atrial level shunt detected by color flow Doppler.  LEFT VENTRICLE PLAX 2D LVIDd:         4.20 cm LVIDs:         2.90 cm LV PW:         1.51 cm LV IVS:        1.57 cm  LEFT ATRIUM           Index LA diam:      5.10 cm 2.59 cm/m LA Vol (A4C): 54.5 ml 27.73 ml/m  AORTIC VALVE AV Vmax:           342.00 cm/s AV VTI:            0.577 m AV Peak Grad:      46.8 mmHg AV Mean Grad:      27.0 mmHg LVOT Vmax:         105.00 cm/s LVOT VTI:          0.160 m LVOT/AV VTI ratio: 0.28  AORTA Ao Root diam: 3.20 cm Ao Asc diam:  3.90 cm MITRAL VALVE MV Mean grad: 16.0 mmHg SHUNTS                         Systemic VTI: 0.16 m Annabella Scarce MD Electronically signed by Annabella Scarce MD Signature Date/Time: 05/19/2023/9:52:51 AM     Final    US  Abdomen Limited RUQ (LIVER/GB) Result Date: 05/18/2023 CLINICAL DATA:  Hyperbilirubinemia, abnormal CT EXAM: ULTRASOUND ABDOMEN LIMITED RIGHT UPPER QUADRANT COMPARISON:  CT 05/17/2023 FINDINGS: Gallbladder: No gallstones or wall thickening visualized. No sonographic Murphy sign noted by sonographer. Common bile duct: Diameter: 3 mm.  No intrahepatic biliary dilation. Liver: Nodular contour and increased parenchymal echogenicity compatible with cirrhosis portal vein is patent on color Doppler imaging with normal direction of blood flow towards the liver. Other: None. IMPRESSION: No evidence of acute cholecystitis.  No biliary dilation. Cirrhosis. Electronically Signed   By: Norman Gatlin M.D.   On: 05/18/2023 21:39   DG Thoracic Spine 2 View Result Date: 05/18/2023 CLINICAL DATA:  Spine pain EXAM: THORACIC SPINE 2 VIEWS COMPARISON:  Chest CT 04/21/2023 FINDINGS: Sternotomy and valve prostheses. Chronic T12 compression deformity. Other vertebra demonstrate normal stature. Moderate multilevel degenerative osteophytes. Alignment is within normal limits. IMPRESSION: Chronic T12 compression deformity. Moderate multilevel degenerative changes. Electronically Signed   By: Luke Bun M.D.   On: 05/18/2023 16:13      LOS: 3 days    Elgin Lam, MD [facility] 05/20/2023, 7:43 AM   If 7PM-7AM, please contact night-coverage [email or web address]

## 2023-05-20 NOTE — Progress Notes (Signed)
 Patient Name: Joseph Raymond Date of Encounter: 05/20/2023 [facility] Cardiologist: Cara JONETTA Lovelace, MD   Interval Summary  .    Feeling better.  Breathing improved.   Vital Signs .    Vitals:   05/19/23 1933 05/19/23 2349 05/20/23 0453 05/20/23 0820  BP: 109/68 97/64 112/64 105/73  Pulse: 83 77 73 70  Resp: 15 16 14 16   Temp: 98.4 F (36.9 C) 98 F (36.7 C) 98.2 F (36.8 C) 98 F (36.7 C)  TempSrc: Oral Oral Oral Oral  SpO2: 94% 95% 96% 93%  Weight:   92 kg   Height:        Intake/Output Summary (Last 24 hours) at 05/20/2023 0904 Last data filed at 05/19/2023 2349 Gross per 24 hour  Intake 598 ml  Output --  Net 598 ml      05/20/2023    4:53 AM 05/19/2023    6:11 AM 05/18/2023    4:37 AM  Last 3 Weights  Weight (lbs) 202 lb 13.2 oz 202 lb 2.6 oz 202 lb 9.6 oz  Weight (kg) 92 kg 91.7 kg 91.9 kg      Telemetry/ECG    Sinus rhythm.  No events - Personally Reviewed  Physical Exam .    VS:  BP 105/73 (BP Location: Right Arm)   Pulse 70   Temp 98 F (36.7 C) (Oral)   Resp 16   Ht 5' 4 (1.626 m)   Wt 92 kg   SpO2 93%   BMI 34.81 kg/m  , BMI Body mass index is 34.81 kg/m. GENERAL:  Well appearing HEENT: Pupils equal round and reactive, fundi not visualized, oral mucosa unremarkable NECK:  No jugular venous distention, waveform within normal limits, carotid upstroke brisk and symmetric, no bruits, no thyromegaly LUNGS:  Clear to auscultation bilaterally HEART:  RRR.  PMI not displaced or sustained,S1 and S2 within normal limits, no S3, no S4, no clicks, no rubs, no murmurs ABD:  Flat, positive bowel sounds normal in frequency in pitch, no bruits, no rebound, no guarding, no midline pulsatile mass, no hepatomegaly, no splenomegaly EXT:  2 plus pulses throughout, no edema, no cyanosis no clubbing SKIN:  No rashes no nodules NEURO:  Cranial nerves II through XII grossly intact, motor grossly intact throughout PSYCH:  Cognitively intact, oriented  to person place and time   Assessment & Plan .     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  # Acute HFpEF:  # Valvular heart disease: Echo reviewed this AM.  His systolic function is normal.  Prosthetic mitral valve is clearly thickened.  Leaflet motion is difficult to assess but gradients are clearly elevated with a mean gradient of 17 mmHg.  He has at least moderate prosthetic aortic valve stenosis.  TEE planned for today.  Platelets remain >50K.  If this confirms severe stenosis we will then proceed with left and right heart catheterization and CT surgery consultation.  Lasix  has been transitioned to oral.     # Hypertension:  Home antihypertensives has been held due to hypotension.  # DM: Uncontrolled.   # Thrombocytopenia:  Slightly improved today.  Reportedly chronic with acute worsening.  Suspected underlying cirrhoisis.    # ?Cirrhosis:  Underlying EtOH abuse.  Cirrhosis suspected on  CT and seen on RUQ ultrasound.  No suggestion of varices on CT.   # Aortic atherosclerosis:  # Coronary Calcification:  # Hyperlipidemia:  LDL 113.  Will hold on statin for now given new diagnosis of cirrhosis.  Consider low dose statin and statin alternatives.  No aspirin  given thrombocytopenia.  # Enlarged pulmonary artery: Likely elevated pulmonary pressure in the setting of L sided heart disease.  He is euvolemic as above.    For questions or updates, please contact [facility] Please consult [email or web address] for contact info under        Signed, Annabella Scarce, MD

## 2023-05-20 NOTE — Progress Notes (Signed)
     Informed Consent   Shared Decision Making/Informed Consent The risks [stroke (1 in 1000), death (1 in 1000), kidney failure [usually temporary] (1 in 500), bleeding (1 in 200), allergic reaction [possibly serious] (1 in 200)], benefits (diagnostic support and management of coronary artery disease) and alternatives of a cardiac catheterization were discussed in detail with Mr. Eddins and he is willing to proceed.    Artist Pouch, PA-C

## 2023-05-20 NOTE — Care Management Important Message (Signed)
 Important Message  Patient Details  Name: Joseph Raymond MRN: [id number] Date of Birth: [birth year]   Important Message Given:  Yes - Medicare IM     Dorena Bodo 05/20/2023, 2:15 PM

## 2023-05-20 NOTE — Progress Notes (Signed)
 CT surgery coordinator Darius Bump made aware of need for CT surgery; they will review upon completion of testing.

## 2023-05-20 NOTE — H&P (View-Only) (Signed)
 Patient Name: Joseph Raymond Date of Encounter: 05/20/2023 [facility] Cardiologist: Cara JONETTA Lovelace, MD   Interval Summary  .    Feeling better.  Breathing improved.   Vital Signs .    Vitals:   05/19/23 1933 05/19/23 2349 05/20/23 0453 05/20/23 0820  BP: 109/68 97/64 112/64 105/73  Pulse: 83 77 73 70  Resp: 15 16 14 16   Temp: 98.4 F (36.9 C) 98 F (36.7 C) 98.2 F (36.8 C) 98 F (36.7 C)  TempSrc: Oral Oral Oral Oral  SpO2: 94% 95% 96% 93%  Weight:   92 kg   Height:        Intake/Output Summary (Last 24 hours) at 05/20/2023 0904 Last data filed at 05/19/2023 2349 Gross per 24 hour  Intake 598 ml  Output --  Net 598 ml      05/20/2023    4:53 AM 05/19/2023    6:11 AM 05/18/2023    4:37 AM  Last 3 Weights  Weight (lbs) 202 lb 13.2 oz 202 lb 2.6 oz 202 lb 9.6 oz  Weight (kg) 92 kg 91.7 kg 91.9 kg      Telemetry/ECG    Sinus rhythm.  No events - Personally Reviewed  Physical Exam .    VS:  BP 105/73 (BP Location: Right Arm)   Pulse 70   Temp 98 F (36.7 C) (Oral)   Resp 16   Ht 5' 4 (1.626 m)   Wt 92 kg   SpO2 93%   BMI 34.81 kg/m  , BMI Body mass index is 34.81 kg/m. GENERAL:  Well appearing HEENT: Pupils equal round and reactive, fundi not visualized, oral mucosa unremarkable NECK:  No jugular venous distention, waveform within normal limits, carotid upstroke brisk and symmetric, no bruits, no thyromegaly LUNGS:  Clear to auscultation bilaterally HEART:  RRR.  PMI not displaced or sustained,S1 and S2 within normal limits, no S3, no S4, no clicks, no rubs, no murmurs ABD:  Flat, positive bowel sounds normal in frequency in pitch, no bruits, no rebound, no guarding, no midline pulsatile mass, no hepatomegaly, no splenomegaly EXT:  2 plus pulses throughout, no edema, no cyanosis no clubbing SKIN:  No rashes no nodules NEURO:  Cranial nerves II through XII grossly intact, motor grossly intact throughout PSYCH:  Cognitively intact, oriented  to person place and time   Assessment & Plan .     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  # Acute HFpEF:  # Valvular heart disease: Echo reviewed this AM.  His systolic function is normal.  Prosthetic mitral valve is clearly thickened.  Leaflet motion is difficult to assess but gradients are clearly elevated with a mean gradient of 17 mmHg.  He has at least moderate prosthetic aortic valve stenosis.  TEE planned for today.  Platelets remain >50K.  If this confirms severe stenosis we will then proceed with left and right heart catheterization and CT surgery consultation.  Lasix  has been transitioned to oral.     # Hypertension:  Home antihypertensives has been held due to hypotension.  # DM: Uncontrolled.   # Thrombocytopenia:  Slightly improved today.  Reportedly chronic with acute worsening.  Suspected underlying cirrhoisis.    # ?Cirrhosis:  Underlying EtOH abuse.  Cirrhosis suspected on  CT and seen on RUQ ultrasound.  No suggestion of varices on CT.   # Aortic atherosclerosis:  # Coronary Calcification:  # Hyperlipidemia:  LDL 113.  Will hold on statin for now given new diagnosis of cirrhosis.  Consider low dose statin and statin alternatives.  No aspirin  given thrombocytopenia.  # Enlarged pulmonary artery: Likely elevated pulmonary pressure in the setting of L sided heart disease.  He is euvolemic as above.    For questions or updates, please contact [facility] Please consult [email or web address] for contact info under        Signed, Annabella Scarce, MD

## 2023-05-20 NOTE — CV Procedure (Signed)
 TEE: Anesthesia: Propofol   See full note in [employer]  Bioprosthetic  stented MV with thickened leaflets and restricted motion.  Mean gradient at HR 88 bpm 21 mmHg peak 27 mmHg with estimated MVA by PT1/2 1.52 but only 0.65 cm2 by VTI   Mild MR  Bioprosthetic AVR stented with no AR mean gradient 22.5 peak 34.1 mmHg AVA 1.3 cm2 with thickened /calcified leaflets restricted motion   TV ring with mild residual TR  Severe basal RV enlargement  No ASD/PFO  Normal PV with trivial PR  No LAA thrombus   Maude Emmer MD [facility]

## 2023-05-20 NOTE — Anesthesia Postprocedure Evaluation (Signed)
 Anesthesia Post Note  Patient: Gracen Southwell  Procedure(s) Performed: TRANSESOPHAGEAL ECHOCARDIOGRAM     Patient location during evaluation: Specials Recovery Anesthesia Type: MAC Level of consciousness: awake and alert Pain management: pain level controlled Vital Signs Assessment: post-procedure vital signs reviewed and stable Respiratory status: spontaneous breathing, nonlabored ventilation, respiratory function stable and patient connected to nasal cannula oxygen Cardiovascular status: blood pressure returned to baseline and stable Postop Assessment: no apparent nausea or vomiting Anesthetic complications: no  No notable events documented.  Last Vitals:  Vitals:   05/20/23 1158 05/20/23 1625  BP: 90/83 104/69  Pulse: 80 76  Resp: 18 16  Temp: 36.7 C 36.9 C  SpO2: 95% 97%    Last Pain:  Vitals:   05/20/23 1625  TempSrc: Oral  PainSc:                  Cheryl Chay L Lonie Rummell

## 2023-05-21 ENCOUNTER — Inpatient Hospital Stay (HOSPITAL_COMMUNITY)
Admission: EM | Disposition: A | Discharge status: Home / Self Care | Payer: Self-pay | Source: Home / Self Care | Attending: Family Medicine

## 2023-05-21 ENCOUNTER — Encounter (HOSPITAL_COMMUNITY): Payer: Self-pay | Admitting: Cardiovascular Disease

## 2023-05-21 DIAGNOSIS — I342 Nonrheumatic mitral (valve) stenosis: Secondary | ICD-10-CM

## 2023-05-21 DIAGNOSIS — Z954 Presence of other heart-valve replacement: Secondary | ICD-10-CM

## 2023-05-21 DIAGNOSIS — I5020 Unspecified systolic (congestive) heart failure: Secondary | ICD-10-CM | POA: Diagnosis not present

## 2023-05-21 HISTORY — PX: RIGHT HEART CATH AND CORONARY ANGIOGRAPHY: CATH118264

## 2023-05-21 LAB — POCT I-STAT EG7
Acid-Base Excess: 4 mmol/L — ABNORMAL HIGH (ref 0.0–2.0)
Acid-Base Excess: 6 mmol/L — ABNORMAL HIGH (ref 0.0–2.0)
Bicarbonate: 30.8 mmol/L — ABNORMAL HIGH (ref 20.0–28.0)
Bicarbonate: 32.3 mmol/L — ABNORMAL HIGH (ref 20.0–28.0)
Calcium, Ion: 1.17 mmol/L (ref 1.15–1.40)
Calcium, Ion: 1.2 mmol/L (ref 1.15–1.40)
HCT: 42 % (ref 39.0–52.0)
HCT: 43 % (ref 39.0–52.0)
Hemoglobin: 14.3 g/dL (ref 13.0–17.0)
Hemoglobin: 14.6 g/dL (ref 13.0–17.0)
O2 Saturation: 64 %
O2 Saturation: 66 %
Potassium: 3.4 mmol/L — ABNORMAL LOW (ref 3.5–5.1)
Potassium: 3.5 mmol/L (ref 3.5–5.1)
Sodium: 136 mmol/L (ref 135–145)
Sodium: 138 mmol/L (ref 135–145)
TCO2: 32 mmol/L (ref 22–32)
TCO2: 34 mmol/L — ABNORMAL HIGH (ref 22–32)
pCO2, Ven: 52 mm[Hg] (ref 44–60)
pCO2, Ven: 52.5 mm[Hg] (ref 44–60)
pH, Ven: 7.376 (ref 7.25–7.43)
pH, Ven: 7.402 (ref 7.25–7.43)
pO2, Ven: 34 mm[Hg] (ref 32–45)
pO2, Ven: 36 mm[Hg] (ref 32–45)

## 2023-05-21 LAB — GLUCOSE, CAPILLARY
Glucose-Capillary: 215 mg/dL — ABNORMAL HIGH (ref 70–99)
Glucose-Capillary: 272 mg/dL — ABNORMAL HIGH (ref 70–99)
Glucose-Capillary: 160 mg/dL — ABNORMAL HIGH (ref 70–99)
Glucose-Capillary: 157 mg/dL — ABNORMAL HIGH (ref 70–99)

## 2023-05-21 LAB — CBC
HCT: 40.8 % (ref 39.0–52.0)
HCT: 41.6 % (ref 39.0–52.0)
Hemoglobin: 13.2 g/dL (ref 13.0–17.0)
Hemoglobin: 13.2 g/dL (ref 13.0–17.0)
MCH: 29.4 pg (ref 26.0–34.0)
MCH: 28.9 pg (ref 26.0–34.0)
MCHC: 32.4 g/dL (ref 30.0–36.0)
MCHC: 31.7 g/dL (ref 30.0–36.0)
MCV: 90.9 fL (ref 80.0–100.0)
MCV: 91.2 fL (ref 80.0–100.0)
Platelets: 59 10*3/uL — ABNORMAL LOW (ref 150–400)
Platelets: 57 10*3/uL — ABNORMAL LOW (ref 150–400)
RBC: 4.49 MIL/uL (ref 4.22–5.81)
RBC: 4.56 MIL/uL (ref 4.22–5.81)
RDW: 12.3 % (ref 11.5–15.5)
RDW: 12.5 % (ref 11.5–15.5)
WBC: 7.7 10*3/uL (ref 4.0–10.5)
WBC: 6.2 10*3/uL (ref 4.0–10.5)
nRBC: 0 % (ref 0.0–0.2)
nRBC: 0 % (ref 0.0–0.2)

## 2023-05-21 LAB — BASIC METABOLIC PANEL
Anion gap: 11 (ref 5–15)
BUN: 20 mg/dL (ref 8–23)
CO2: 25 mmol/L (ref 22–32)
Calcium: 8.5 mg/dL — ABNORMAL LOW (ref 8.9–10.3)
Chloride: 99 mmol/L (ref 98–111)
Creatinine, Ser: 0.67 mg/dL (ref 0.61–1.24)
GFR, Estimated: >60 mL/min (ref >60)
Glucose, Bld: 249 mg/dL — ABNORMAL HIGH (ref 70–99)
Potassium: 4.4 mmol/L (ref 3.5–5.1)
Sodium: 135 mmol/L (ref 135–145)

## 2023-05-21 LAB — HEPATIC FUNCTION PANEL
ALT: 44 U/L (ref 0–44)
AST: 34 U/L (ref 15–41)
Albumin: 3.5 g/dL (ref 3.5–5.0)
Alkaline Phosphatase: 56 U/L (ref 38–126)
Bilirubin, Direct: 0.3 mg/dL — ABNORMAL HIGH (ref 0.0–0.2)
Indirect Bilirubin: 0.9 mg/dL (ref 0.3–0.9)
Total Bilirubin: 1.2 mg/dL (ref 0.0–1.2)
Total Protein: 6.5 g/dL (ref 6.5–8.1)

## 2023-05-21 LAB — POCT I-STAT 7, (LYTES, BLD GAS, ICA,H+H)
Acid-Base Excess: 4 mmol/L — ABNORMAL HIGH (ref 0.0–2.0)
Bicarbonate: 29.2 mmol/L — ABNORMAL HIGH (ref 20.0–28.0)
Calcium, Ion: 1.12 mmol/L — ABNORMAL LOW (ref 1.15–1.40)
HCT: 41 % (ref 39.0–52.0)
Hemoglobin: 13.9 g/dL (ref 13.0–17.0)
O2 Saturation: 93 %
Potassium: 3.4 mmol/L — ABNORMAL LOW (ref 3.5–5.1)
Sodium: 140 mmol/L (ref 135–145)
TCO2: 31 mmol/L (ref 22–32)
pCO2 arterial: 45.2 mm[Hg] (ref 32–48)
pH, Arterial: 7.418 (ref 7.35–7.45)
pO2, Arterial: 68 mm[Hg] — ABNORMAL LOW (ref 83–108)

## 2023-05-21 LAB — CREATININE, SERUM
Creatinine, Ser: 0.64 mg/dL (ref 0.61–1.24)
GFR, Estimated: >60 mL/min (ref >60)

## 2023-05-21 SURGERY — RIGHT HEART CATH AND CORONARY ANGIOGRAPHY
Anesthesia: LOCAL

## 2023-05-21 MED ORDER — LIDOCAINE HCL (PF) 1 % IJ SOLN
INTRAMUSCULAR | Status: AC
  Filled 2023-05-21: qty 30

## 2023-05-21 MED ORDER — FENTANYL CITRATE (PF) 100 MCG/2ML IJ SOLN
INTRAMUSCULAR | Status: AC
  Filled 2023-05-21: qty 2

## 2023-05-21 MED ORDER — IOHEXOL 350 MG/ML SOLN
INTRAVENOUS | Status: DC | PRN
  Administered 2023-05-21: 45 mL

## 2023-05-21 MED ORDER — INSULIN GLARGINE-YFGN 100 UNIT/ML ~~LOC~~ SOLN
15.0000 [IU] | Freq: Every day | SUBCUTANEOUS | Status: DC
  Administered 2023-05-21 – 2023-05-22 (×2): 15 [IU] via SUBCUTANEOUS
  Filled 2023-05-21 (×2): qty 0.15

## 2023-05-21 MED ORDER — SODIUM CHLORIDE 0.9 % IV SOLN: 250.0000 mL | INTRAVENOUS | Status: AC | PRN

## 2023-05-21 MED ORDER — MIDAZOLAM HCL 2 MG/2ML IJ SOLN
INTRAMUSCULAR | Status: DC | PRN
  Administered 2023-05-21: 1 mg via INTRAVENOUS

## 2023-05-21 MED ORDER — VERAPAMIL HCL 2.5 MG/ML IV SOLN
INTRAVENOUS | Status: AC
  Filled 2023-05-21: qty 2

## 2023-05-21 MED ORDER — SODIUM CHLORIDE 0.9% FLUSH: 3.0000 mL | INTRAVENOUS | Status: DC | PRN

## 2023-05-21 MED ORDER — SODIUM CHLORIDE 0.9% FLUSH
3.0000 mL | Freq: Two times a day (BID) | INTRAVENOUS | Status: DC
  Administered 2023-05-21 – 2023-05-22 (×3): 3 mL via INTRAVENOUS

## 2023-05-21 MED ORDER — HEPARIN SODIUM (PORCINE) 1000 UNIT/ML IJ SOLN
INTRAMUSCULAR | Status: DC | PRN
  Administered 2023-05-21: 4500 [IU] via INTRAVENOUS

## 2023-05-21 MED ORDER — FENTANYL CITRATE (PF) 100 MCG/2ML IJ SOLN
INTRAMUSCULAR | Status: DC | PRN
  Administered 2023-05-21: 25 ug via INTRAVENOUS

## 2023-05-21 MED ORDER — MIDAZOLAM HCL 2 MG/2ML IJ SOLN
INTRAMUSCULAR | Status: AC
Start: 2023-05-21 — End: ?
  Filled 2023-05-21: qty 2

## 2023-05-21 MED ORDER — HEPARIN SODIUM (PORCINE) 1000 UNIT/ML IJ SOLN
INTRAMUSCULAR | Status: AC
  Filled 2023-05-21: qty 10

## 2023-05-21 MED ORDER — HEPARIN (PORCINE) IN NACL 1000-0.9 UT/500ML-% IV SOLN
INTRAVENOUS | Status: DC | PRN
  Administered 2023-05-21: 1000 mL

## 2023-05-21 MED ORDER — VERAPAMIL HCL 2.5 MG/ML IV SOLN
INTRAVENOUS | Status: DC | PRN
  Administered 2023-05-21: 10 mL via INTRA_ARTERIAL

## 2023-05-21 MED ORDER — HEPARIN SODIUM (PORCINE) 5000 UNIT/ML IJ SOLN
5000.0000 [IU] | Freq: Three times a day (TID) | INTRAMUSCULAR | Status: DC
  Administered 2023-05-21 – 2023-05-22 (×2): 5000 [IU] via SUBCUTANEOUS
  Filled 2023-05-21 (×2): qty 1

## 2023-05-21 MED ORDER — LIDOCAINE HCL (PF) 1 % IJ SOLN
INTRAMUSCULAR | Status: DC | PRN
  Administered 2023-05-21: 5 mL

## 2023-05-21 SURGICAL SUPPLY — 12 items
CATH 5FR JL3.5 JR4 ANG PIG MP (CATHETERS) IMPLANT
CATH BALLN WEDGE 5F 110CM (CATHETERS) IMPLANT
DEVICE RAD COMP TR BAND LRG (VASCULAR PRODUCTS) IMPLANT
GLIDESHEATH SLEND SS 6F .021 (SHEATH) IMPLANT
GUIDEWIRE INQWIRE 1.5J.035X260 (WIRE) IMPLANT
INQWIRE 1.5J .035X260CM (WIRE) ×1 IMPLANT
KIT SYRINGE INJ CVI SPIKEX1 (MISCELLANEOUS) IMPLANT
PACK CARDIAC CATHETERIZATION (CUSTOM PROCEDURE TRAY) ×1 IMPLANT
SET ATX-X65L (MISCELLANEOUS) IMPLANT
SHEATH GLIDE SLENDER 4/5FR (SHEATH) IMPLANT
SHEATH PROBE COVER 6X72 (BAG) IMPLANT
WIRE EMERALD 3MM-J .025X260CM (WIRE) IMPLANT

## 2023-05-21 NOTE — Plan of Care (Signed)

## 2023-05-21 NOTE — Interval H&P Note (Signed)
 History and Physical Interval Note:  05/21/2023 8:32 AM  Joseph Raymond  has presented today for surgery, with the diagnosis of valve stenosis.  The various methods of treatment have been discussed with the patient and family. After consideration of risks, benefits and other options for treatment, the patient has consented to  Procedure(s): RIGHT/LEFT HEART CATH AND CORONARY ANGIOGRAPHY (N/A) as a surgical intervention.  The patient's history has been reviewed, patient examined, no change in status, stable for surgery.  I have reviewed the patient's chart and labs.  Questions were answered to the patient's satisfaction.     [name] 05/21/2023 8:32 AM

## 2023-05-21 NOTE — Progress Notes (Signed)
 PROGRESS NOTE    Joseph Raymond  FMW:[id number] DOB: 1961-03-18 DOA: 05/16/2023 PCP: [facility]   Brief Narrative: 63 year old with past medical history significant hypertension, aortic/tricuspid mitral valve replacement, endocarditis on suppressive antibiotics therapy, paroxysmal A-fib, GI bleed, diabetes type 2, chronic back pain, presents secondary to back pain and shortness of breath and was found to have evidence of acute heart failure.  Cardiology consulted patient started on IV Lasix .  Patient was found to have severe mitral valve stenosis and moderate aortic valve stenosis.  By TEE.  Underwent heart cath 1/8 show normal coronary anatomy high PCWP 34/39 severe pulmonary hypertension, normal cardiac output.  CVTS has been consulted for evaluation for surgery     Assessment & Plan:   Principal Problem:   Heart failure with reduced ejection fraction (HCC) Active Problems:   Elevated troponin   Type II diabetes mellitus with renal manifestations (HCC)   Thrombocytopenia (HCC)   Cirrhosis (HCC)   History of heart valve replacement   History of endocarditis   Paroxysmal atrial flutter (HCC)   Alcohol use   Acute on chronic heart failure with preserved ejection fraction (HCC)   Severe mitral valve stenosis   Moderate aortic stenosis  1-Acute on chronic heart failure exacerbation with normal ejection fraction, diastolic dysfunction -Presented with worsening dyspnea, orthopnea, elevated BNP.  Chest x-ray with vascular congestion.  Moderate right and small left pleural effusion. -Treated with IV Lasix . -Likely in the setting of severe mitral valve regurgitation and aortic valve stenosis -Underwent TEE and Heart  cath.  TEE showed severe mitral valve stenosis, moderate aortic valve stenosis.  Cath showed normal coronary arteries   Elevation of troponin: -Setting of heart failure exacerbation and valve disease. -Heart cath normal coronary  arteries  Diabetes type 2: Resume Semglee   and sliding scale insulin   Thrombocytopenia: -chronic Thrombocytopenia 120 K -Suspect related to underlying cirrhosis -Platelet count improving  Hyperphosphatemia: Resolved.   Possible cirrhosis: -Patient with history of underlying alcohol use, bilirubin 1.9 INR 1.1 cirrhosis suggested on right upper quadrant ultrasound. -Need to follow-up with GI as an outpatient  Chronic T12 compression deformity: -Pain Management. PT  Paroxysmal A-fib: -Not on anticoagulation secondary to history of GI bleed  History of aortic, tricuspid, mitral valve replacement History of endocarditis -Continue with chronic amoxicillin  -Severe mitral valve stenosis and moderate aortic stenosis by TEE. -CVTS consulted for further evaluation  GERD: -PPI  Alcohol use: Counseling.   Aortic atherosclerosis: LDL 113 Obesity class I: need life style modifications.    Estimated body mass index is 35.16 kg/m as calculated from the following:   Height as of this encounter: 5' 4 (1.626 m).   Weight as of this encounter: 92.9 kg.   DVT prophylaxis: Heparin  Code Status: Full code Family Communication: Care discussed with patient Disposition Plan:  Status is: Inpatient Remains inpatient appropriate because: Management of HF    Consultants:  Cardiology CVTS  Procedures:  TEE, Cath  Antimicrobials:    Subjective: He is feeling better. Denies chest pain. Report dyspnea has improved.   Objective: Vitals:   05/20/23 2018 05/20/23 2357 05/21/23 0330 05/21/23 0730  BP: 99/69 109/62 103/77 102/69  Pulse: 71 71 75 68  Resp: 16 18 16 15   Temp: 98.6 F (37 C) 98.2 F (36.8 C) 98 F (36.7 C) 98.2 F (36.8 C)  TempSrc: Oral Oral Oral Oral  SpO2: 94% 93% 94% 93%  Weight:   92.9 kg   Height:  Intake/Output Summary (Last 24 hours) at 05/21/2023 0746 Last data filed at 05/20/2023 2200 Gross per 24 hour  Intake 460 ml  Output --  Net 460 ml    Filed Weights   05/19/23 0611 05/20/23 0453 05/21/23 0330  Weight: 91.7 kg 92 kg 92.9 kg    Examination:  General exam: Appears calm and comfortable  Respiratory system: Clear to auscultation. Respiratory effort normal. Cardiovascular system: S1 & S2 heard, Gastrointestinal system: Abdomen is nondistended, soft and nontender. No organomegaly or masses felt. Normal bowel sounds heard. Central nervous system: Alert and oriented. Extremities: Symmetric 5 x 5 power.   Data Reviewed: I have personally reviewed following labs and imaging studies  CBC: Recent Labs  Lab 05/16/23 1932 05/18/23 0903 05/19/23 0213 05/20/23 0636 05/21/23 0231  WBC 7.9 7.8 8.3 6.8 7.7  HGB 13.8 14.8 13.7 13.7 13.2  HCT 42.8 45.5 42.4 42.4 40.8  MCV 91.6 90.6 90.6 90.2 90.9  PLT 39* 44* 51* 56* 59*   Basic Metabolic Panel: Recent Labs  Lab 05/16/23 1932 05/17/23 0801 05/18/23 0206 05/19/23 0213 05/20/23 0233 05/21/23 0231  NA 137  --  136 134* 136 135  K 4.2  --  3.5 3.7 3.7 4.4  CL 99  --  96* 96* 98 99  CO2 26  --  30 27 29 25   GLUCOSE 382*  --  303* 336* 294* 249*  BUN 11  --  15 22 18 20   CREATININE 0.68  --  0.73 0.90 0.69 0.67  CALCIUM  9.2  --  9.3 8.8* 8.7* 8.5*  MG  --  1.9 2.0  --   --   --   PHOS  --   --  5.3* 4.5  --   --    GFR: Estimated Creatinine Clearance: 95.9 mL/min (by C-G formula based on SCr of 0.67 mg/dL). Liver Function Tests: Recent Labs  Lab 05/17/23 0801  AST 50*  ALT 57*  ALKPHOS 67  BILITOT 1.9*  PROT 6.9  ALBUMIN 4.0   No results for input(s): LIPASE, AMYLASE in the last 168 hours. No results for input(s): AMMONIA in the last 168 hours. Coagulation Profile: Recent Labs  Lab 05/17/23 0801  INR 1.1   Cardiac Enzymes: No results for input(s): CKTOTAL, CKMB, CKMBINDEX, TROPONINI in the last 168 hours. BNP (last 3 results) No results for input(s): PROBNP in the last 8760 hours. HbA1C: No results for input(s): HGBA1C in the  last 72 hours. CBG: Recent Labs  Lab 05/20/23 1328 05/20/23 1623 05/20/23 1624 05/20/23 2111 05/21/23 0620  GLUCAP 164* 309* 277* 178* 215*   Lipid Profile: Recent Labs    05/19/23 0213  CHOL 168  HDL 32*  LDLCALC 113*  TRIG 117  CHOLHDL 5.3   Thyroid Function Tests: No results for input(s): TSH, T4TOTAL, FREET4, T3FREE, THYROIDAB in the last 72 hours. Anemia Panel: No results for input(s): VITAMINB12, FOLATE, FERRITIN, TIBC, IRON , RETICCTPCT in the last 72 hours. Sepsis Labs: No results for input(s): PROCALCITON, LATICACIDVEN in the last 168 hours.  Recent Results (from the past 240 hours)  MRSA Next Gen by PCR, Nasal     Status: None   Collection Time: 05/17/23  3:18 PM   Specimen: Nasal Mucosa; Nasal Swab  Result Value Ref Range Status   MRSA by PCR Next Gen NOT DETECTED NOT DETECTED Final    Comment: (NOTE) The GeneXpert MRSA Assay (FDA approved for NASAL specimens only), is one component of a comprehensive MRSA colonization surveillance program. It  is not intended to diagnose MRSA infection nor to guide or monitor treatment for MRSA infections. Test performance is not FDA approved in patients less than 84 years old. Performed at [facility], [address] [place], [place] [address]          Radiology Studies: ECHO TEE Result Date: 05/20/2023    TRANSESOPHOGEAL ECHO REPORT   Patient Name:   THATCHER DOBERSTEIN Date of Exam: 05/20/2023 Medical Rec #:  [id number]           Height:       64.0 in Accession #:    [id number]          Weight:       202.8 lb Date of Birth:  [birth year]           BSA:          1.968 m Patient Age:    62 years            BP:           125/88 mmHg Patient Gender: M                   HR:           88 bpm. Exam Location:  Inpatient Procedure: Transesophageal Echo, Cardiac Doppler, Color Doppler and 3D Echo Indications:     Bioprosthetic Valve Abnormality  History:         Patient has prior history of  Echocardiogram examinations, most                  recent 05/18/2023. Aortic Valve Disease and Mitral Valve Disease;                  Risk Factors:Hypertension and Diabetes.  Sonographer:     Jayson Gaskins Referring Phys:  [id number] [facility] Diagnosing Phys: Maude Emmer MD PROCEDURE: After discussion of the risks and benefits of a TEE, an informed consent was obtained from the patient. The transesophogeal probe was passed without difficulty through the esophogus of the patient. Sedation performed by different physician. The patient was monitored while under deep sedation. Anesthestetic sedation was provided intravenously by Anesthesiology: 185mg  of Propofol , 100mg  of Lidocaine . The patient developed no complications during the procedure.  IMPRESSIONS  1. Left ventricular ejection fraction, by estimation, is 60 to 65%. The left ventricle has normal function. There is mild left ventricular hypertrophy.  2. Right ventricular systolic function is moderately reduced. The right ventricular size is moderately enlarged.  3. Left atrial size was severely dilated. No left atrial/left atrial appendage thrombus was detected.  4. Right atrial size was moderately dilated.  5. 29 mm Magna Mitral Ease stented bioprosthetic valve. Mild MR Leafelts are severely thickened with restricted motion and severe MS Mean gradient at HR 88 bpm 21 mmHg peak 27 mmHg MVA by VTI 0.7 cm2 . The mitral valve has been repaired/replaced. Mild mitral valve regurgitation. Severe mitral stenosis.  6. Prior TV ring 30 mm Carpentier Edwards Physio. The tricuspid valve is has been repaired/replaced.  7. 25 mm Bovine stented Inspiris valve. Leaflets are thickened with restricted motion mild AR mean gradient 22.5 mmHg peak 34.1 mmHg AVA 1.3 cm2 . The aortic valve has been repaired/replaced. Aortic valve regurgitation is mild. Moderate aortic valve stenosis. FINDINGS  Left Ventricle: Left ventricular ejection fraction, by estimation, is 60 to 65%. The left  ventricle has normal function. The left ventricular internal cavity size was normal in size. There is mild  left ventricular hypertrophy. Right Ventricle: The right ventricular size is moderately enlarged. Right vetricular wall thickness was not assessed. Right ventricular systolic function is moderately reduced. Left Atrium: Left atrial size was severely dilated. No left atrial/left atrial appendage thrombus was detected. Right Atrium: Right atrial size was moderately dilated. Pericardium: There is no evidence of pericardial effusion. Mitral Valve: 29 mm Magna Mitral Ease stented bioprosthetic valve. Mild MR Leafelts are severely thickened with restricted motion and severe MS Mean gradient at HR 88 bpm 21 mmHg peak 27 mmHg MVA by VTI 0.7 cm2. The mitral valve has been repaired/replaced. Mild mitral valve regurgitation. There is a 29 mm Magna Mitral Ease bovine pericardial present in the mitral position. Severe mitral valve stenosis. MV peak gradient, 27.0 mmHg. The mean mitral valve gradient is 21.0 mmHg. Tricuspid Valve: Prior TV ring 30 mm Carpentier Edwards Physio. The tricuspid valve is has been repaired/replaced. Tricuspid valve regurgitation is mild. Aortic Valve: 25 mm Bovine stented Inspiris valve. Leaflets are thickened with restricted motion mild AR mean gradient 22.5 mmHg peak 34.1 mmHg AVA 1.3 cm2. The aortic valve has been repaired/replaced. Aortic valve regurgitation is mild. Moderate aortic stenosis is present. Aortic valve mean gradient measures 22.5 mmHg. Aortic valve peak gradient measures 34.1 mmHg. Aortic valve area, by VTI measures 1.31 cm. There is a 25 mm Inspiris bovine pericardial valve valve present in the aortic position. There  is no evidence of aortic valve vegetation. Pulmonic Valve: The pulmonic valve was normal in structure. Pulmonic valve regurgitation is trivial. Aorta: The aortic root is normal in size and structure. IAS/Shunts: No atrial level shunt detected by color flow  Doppler.  LEFT VENTRICLE PLAX 2D LVOT diam:     2.30 cm LV SV:         56 LV SV Index:   28 LVOT Area:     4.15 cm  AORTIC VALVE AV Area (Vmax):    1.23 cm AV Area (Vmean):   1.35 cm AV Area (VTI):     1.31 cm AV Vmax:           292.00 cm/s AV Vmean:          226.500 cm/s AV VTI:            0.428 m AV Peak Grad:      34.1 mmHg AV Mean Grad:      22.5 mmHg LVOT Vmax:         86.60 cm/s LVOT Vmean:        73.400 cm/s LVOT VTI:          0.135 m LVOT/AV VTI ratio: 0.32 MITRAL VALVE MV Area (PHT): 1.52 cm    SHUNTS MV Area VTI:   0.65 cm    Systemic VTI:  0.14 m MV Peak grad:  27.0 mmHg   Systemic Diam: 2.30 cm MV Mean grad:  21.0 mmHg MV Vmax:       2.60 m/s MV Vmean:      220.0 cm/s Maude Emmer MD Electronically signed by Maude Emmer MD Signature Date/Time: 05/20/2023/11:29:05 AM    Final         Scheduled Meds:  amoxicillin   500 mg Oral BID   folic acid   1 mg Oral Daily   furosemide   40 mg Oral Daily   insulin  aspart  0-5 Units Subcutaneous QHS   insulin  aspart  0-9 Units Subcutaneous TID WC   lidocaine   2 patch Transdermal Q24H   multivitamin with minerals  1 tablet Oral  Daily   pantoprazole   40 mg Oral BID AC   sodium chloride  flush  3 mL Intravenous Q12H   thiamine   100 mg Oral Daily   Or   thiamine   100 mg Intravenous Daily   Continuous Infusions:   LOS: 4 days    Time spent: 35 Minutes    Abdulaziz Toman A Lamisha Roussell, MD [facility]   If 7PM-7AM, please contact night-coverage [email or web address]  05/21/2023, 7:46 AM

## 2023-05-21 NOTE — Consult Note (Addendum)
 [address]       [place] [address]             [phone]        Daksh Coates [facility] Medical Record #[id number] Date of Birth: 05-09-1961  Referring: No ref. provider found Primary Care: [facility] Primary Cardiologist:Dwayne JONETTA Lovelace, MD  Chief Complaint:    Chief Complaint  Patient presents with   Back Pain   Shortness of Breath   History of Present Illness:     Mr. Cornforth is a 63 year old male with a past medical history of paroxysmal atrial fibrillation/flutter (s/p ablation 12/2016), HTN, HLD, T2DM, GI bleed (in the setting of AVM's), and s/p bioprosthetic AVR (25mm Inspiris bovine pericardial bioprosthetic valve), MVR (29mm Magna mitral ease bovine bioprosthetic valve) and tricuspid annuloplasty (30mm Carpentier Edwards Physio ring) in the setting of endocarditis in 11/2016 at [facility]. The patient is not on anticoagulation due to GI bleed. The patient presented to [facility] ED on 05/17/23 due to worsening shortness of breath over the past 4 weeks. He states he felt like he could not breath and was becoming fatigued quickly with exertion. He also admits to weakness and dizziness during ambulation. He denies exertional chest pain, lower extremity swelling, palpitations and syncope. Upon arrival his platelets were 39,000, previously 120,000 in 12/24. BNP was elevated at 553, troponin I (high sensitivity) was 31 and 34. CXR showed mild vascular congestion and EKG was without acute ischemia. Echocardiogram on 05/20/23 showed LVEF 60-65%, mild mitral valve regurgitation, severe mitral stenosis, mild aortic valve regurgitation, moderate aortic valve stenosis, and mild tricuspid valve regurgitation. Heart catheterization on 01/08 was without CAD but did show severe pulmonary HTN.   Patient is lying in bed and states his breathing has improved. The patient lives alone but his daughter lives across the street. 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   Current Activity/ Functional Status: Patient is independent with mobility/ambulation, transfers, ADL's, IADL's.   Zubrod Score: At the time of surgery this patient's most appropriate activity status/level should be described as: []     0    Normal activity, no symptoms [x]     1    Restricted in physical strenuous activity but ambulatory, able to do out light work []     2    Ambulatory and capable of self care, unable to do work activities, up and about                 more than 50%  Of the time                            []     3    Only limited self care, in bed greater than 50% of waking hours []     4    Completely disabled, no self care, confined to bed or chair []     5    Moribund  Past Medical History:  Diagnosis Date   Chronic back pain    Diabetes mellitus without complication (HCC)    Hypertension    Lumbar radiculopathy     Past Surgical History:  Procedure Laterality Date   ESOPHAGOGASTRODUODENOSCOPY N/A 10/01/2019   Procedure: ESOPHAGOGASTRODUODENOSCOPY (EGD);  Surgeon: Toledo, Ladell POUR, MD;  Location: [facility] ENDOSCOPY;  Service: Gastroenterology;  Laterality: N/A;   ESOPHAGOGASTRODUODENOSCOPY (EGD) WITH PROPOFOL  N/A 10/08/2022   Procedure: ESOPHAGOGASTRODUODENOSCOPY (  EGD) WITH PROPOFOL ;  Surgeon: Jinny Carmine, MD;  Location: [facility] ENDOSCOPY;  Service: Endoscopy;  Laterality: N/A;   KNEE SURGERY     TRANSESOPHAGEAL ECHOCARDIOGRAM (CATH LAB) N/A 05/20/2023   Procedure: TRANSESOPHAGEAL ECHOCARDIOGRAM;  Surgeon: Delford Maude BROCKS, MD;  Location: [facility] INVAS[facility] CV LAB;  Service: Cardiovascular;  Laterality: N/A;    Social History   Tobacco Use  Smoking Status Never  Smokeless Tobacco Never    Social History   Substance and Sexual Activity  Alcohol Use Not Currently   Alcohol/week: 2.0 standard drinks of alcohol   Types: 2 Cans of beer per week   Comment: occ   No Known Allergies  Current Facility-Administered Medications   Medication Dose Route Frequency Provider Last Rate Last Admin   [MAR Hold] acetaminophen  (TYLENOL ) tablet 650 mg  650 mg Oral Q4H PRN Nishan, Peter C, MD   650 mg at 05/18/23 2139   [facility] Hold] amoxicillin  (AMOXIL ) capsule 500 mg  500 mg Oral BID Nishan, Peter C, MD   500 mg at 05/20/23 2154   [facility] Hold] folic acid  (FOLVITE ) tablet 1 mg  1 mg Oral Daily Nishan, Peter C, MD   1 mg at 05/20/23 1336   [MAR Hold] furosemide  (LASIX ) tablet 40 mg  40 mg Oral Daily Nishan, Peter C, MD   40 mg at 05/20/23 1336   [MAR Hold] insulin  aspart (novoLOG ) injection 0-5 Units  0-5 Units Subcutaneous QHS Nishan, Peter C, MD   2 Units at 05/17/23 2118   [facility] Hold] insulin  aspart (novoLOG ) injection 0-9 Units  0-9 Units Subcutaneous TID WC Nishan, Peter C, MD   2 Units at 05/21/23 0623   [MAR Hold] lidocaine  (LIDODERM ) 5 % 2 patch  2 patch Transdermal Q24H Nishan, Peter C, MD   2 patch at 05/20/23 1334   [MAR Hold] multivitamin with minerals tablet 1 tablet  1 tablet Oral Daily Nishan, Peter C, MD   1 tablet at 05/20/23 1336   [MAR Hold] ondansetron  (ZOFRAN ) injection 4 mg  4 mg Intravenous Q6H PRN Nishan, Peter C, MD       [MAR Hold] pantoprazole  (PROTONIX ) EC tablet 40 mg  40 mg Oral BID AC Nishan, Peter C, MD   40 mg at 05/21/23 0606   [facility] Hold] sodium chloride  flush (NS) 0.9 % injection 3 mL  3 mL Intravenous Q12H Nishan, Peter C, MD   3 mL at 05/20/23 2154   [facility] Hold] sodium chloride  flush (NS) 0.9 % injection 3 mL  3 mL Intravenous PRN Nishan, Peter C, MD       [MAR Hold] thiamine  (VITAMIN B1) tablet 100 mg  100 mg Oral Daily Nishan, Peter C, MD   100 mg at 05/20/23 1336   Or   [MAR Hold] thiamine  (VITAMIN B1) injection 100 mg  100 mg Intravenous Daily Nishan, Peter C, MD        Medications Prior to Admission  Medication Sig Dispense Refill Last Dose/Taking   amoxicillin  (AMOXIL ) 500 MG capsule Take 1 capsule (500 mg total) by mouth 2 (two) times daily. 60 capsule 2 05/17/2023 Morning   losartan  (COZAAR ) 25  MG tablet Take 25 mg by mouth daily.   05/17/2023 Morning   pantoprazole  (PROTONIX ) 40 MG tablet Take 1 tablet (40 mg total) by mouth 2 (two) times daily before a meal. 60 tablet 0 05/17/2023 Morning   senna-docusate (SENOKOT-S) 8.6-50 MG tablet Take 1-2 tablets by mouth at bedtime as needed for moderate constipation or mild constipation. 90 tablet  0 Past Week   ferrous sulfate  325 (65 FE) MG EC tablet Take 1 tablet (325 mg total) by mouth daily with breakfast. 90 tablet 0    furosemide  (LASIX ) 20 MG tablet Take 1 tablet (20 mg total) by mouth daily for 7 days. 7 tablet 0    terbinafine (LAMISIL) 250 MG tablet Take 250 mg by mouth daily. (Patient not taking: Reported on 05/17/2023)   Not Taking    Family History  Problem Relation Age of Onset   Diabetes Mellitus II Mother    Review of Systems:  Review of Systems  Constitutional:  Positive for malaise/fatigue. Negative for chills and fever.  HENT:  Negative for hearing loss.   Eyes:  Negative for blurred vision.  Respiratory:  Positive for wheezing. Negative for cough, sputum production and shortness of breath.   Cardiovascular:  Negative for chest pain, palpitations, orthopnea and leg swelling.  Gastrointestinal:  Negative for heartburn, nausea and vomiting.  Genitourinary:  Negative for dysuria.  Skin:  Negative for rash.  Neurological:  Positive for dizziness. Negative for focal weakness and loss of consciousness.  Endo/Heme/Allergies:  Does not bruise/bleed easily.  Psychiatric/Behavioral:  Negative for depression. The patient is not nervous/anxious.   Dental: Does not remember the last time he saw the dentist  Physical Exam: BP 102/69 (BP Location: Left Arm)   Pulse 68   Temp 98.2 F (36.8 C) (Oral)   Resp 15   Ht 5' 4 (1.626 m)   Wt 92.9 kg   SpO2 92%   BMI 35.16 kg/m   General appearance: alert, cooperative, and no distress Head: Normocephalic, without obvious abnormality, atraumatic Neck: no adenopathy, no carotid bruit, no  JVD, supple, symmetrical, trachea midline, and thyroid not enlarged, symmetric, no tenderness/mass/nodules Lymph nodes: Cervical, supraclavicular, and axillary nodes normal. Resp: clear to auscultation bilaterally Cardio: regular rate and rhythm, S1, S2 normal, no murmur, click, rub or gallop GI: soft, non-tender; bowel sounds normal; no masses,  no organomegaly Extremities: extremities normal, atraumatic, no cyanosis or edema Neurologic: Grossly normal Dental: Likely dental carries but no sign of infection  Diagnostic Studies & Recent Radiology Findings:  TRANSESOPHOGEAL ECHO REPORT    Patient Name:   MADDON HORTON Date of Exam: 05/20/2023  Medical Rec #:  [id number]           Height:       64.0 in  Accession #:    [id number]          Weight:       202.8 lb  Date of Birth:  1960/09/27           BSA:          1.968 m  Patient Age:    62 years            BP:           125/88 mmHg  Patient Gender: M                   HR:           88 bpm.  Exam Location:  Inpatient   Procedure: Transesophageal Echo, Cardiac Doppler, Color Doppler and 3D  Echo   Indications:    Bioprosthetic Valve Abnormality    History:         Patient has prior history of Echocardiogram examinations,  most                  recent 05/18/2023. Aortic Valve Disease and  Mitral Valve  Disease;                  Risk Factors:Hypertension and Diabetes.    Sonographer:     Jayson Gaskins  Referring Phys:  [id number] [facility]  Diagnosing Phys: Maude Emmer MD   PROCEDURE: After discussion of the risks and benefits of a TEE, an  informed consent was obtained from the patient. The transesophogeal probe  was passed without difficulty through the esophogus of the patient.  Sedation performed by different physician.  The patient was monitored while under deep sedation. Anesthestetic  sedation was provided intravenously by Anesthesiology: 185mg  of Propofol ,  100mg  of Lidocaine . The patient developed no complications  during the  procedure.    IMPRESSIONS    1. Left ventricular ejection fraction, by estimation, is 60 to 65%. The  left ventricle has normal function. There is mild left ventricular  hypertrophy.   2. Right ventricular systolic function is moderately reduced. The right  ventricular size is moderately enlarged.   3. Left atrial size was severely dilated. No left atrial/left atrial  appendage thrombus was detected.   4. Right atrial size was moderately dilated.   5. 29 mm Magna Mitral Ease stented bioprosthetic valve. Mild MR Leafelts  are severely thickened with restricted motion and severe MS Mean gradient  at HR 88 bpm 21 mmHg peak 27 mmHg MVA by VTI 0.7 cm2 . The mitral valve  has been repaired/replaced. Mild  mitral valve regurgitation. Severe mitral stenosis.   6. Prior TV ring 30 mm Carpentier Edwards Physio. The tricuspid valve is  has been repaired/replaced.   7. 25 mm Bovine stented Inspiris valve. Leaflets are thickened with  restricted motion mild AR mean gradient 22.5 mmHg peak 34.1 mmHg AVA 1.3  cm2 . The aortic valve has been repaired/replaced. Aortic valve  regurgitation is mild. Moderate aortic valve  stenosis.   FINDINGS   Left Ventricle: Left ventricular ejection fraction, by estimation, is 60  to 65%. The left ventricle has normal function. The left ventricular  internal cavity size was normal in size. There is mild left ventricular  hypertrophy.   Right Ventricle: The right ventricular size is moderately enlarged. Right  vetricular wall thickness was not assessed. Right ventricular systolic  function is moderately reduced.   Left Atrium: Left atrial size was severely dilated. No left atrial/left  atrial appendage thrombus was detected.   Right Atrium: Right atrial size was moderately dilated.   Pericardium: There is no evidence of pericardial effusion.   Mitral Valve: 29 mm Magna Mitral Ease stented bioprosthetic valve. Mild MR  Leafelts are severely  thickened with restricted motion and severe MS Mean  gradient at HR 88 bpm 21 mmHg peak 27 mmHg MVA by VTI 0.7 cm2. The mitral  valve has been  repaired/replaced. Mild mitral valve regurgitation. There is a 29 mm Magna  Mitral Ease bovine pericardial present in the mitral position. Severe  mitral valve stenosis. MV peak gradient, 27.0 mmHg. The mean mitral valve  gradient is 21.0 mmHg.   Tricuspid Valve: Prior TV ring 30 mm Carpentier Edwards Physio. The  tricuspid valve is has been repaired/replaced. Tricuspid valve  regurgitation is mild.   Aortic Valve: 25 mm Bovine stented Inspiris valve. Leaflets are thickened  with restricted motion mild AR mean gradient 22.5 mmHg peak 34.1 mmHg AVA  1.3 cm2. The aortic valve has been repaired/replaced. Aortic valve  regurgitation is mild. Moderate aortic  stenosis is present. Aortic valve mean gradient  measures 22.5 mmHg. Aortic  valve peak gradient measures 34.1 mmHg. Aortic valve area, by VTI measures  1.31 cm. There is a 25 mm Inspiris bovine pericardial valve valve present  in the aortic position. There   is no evidence of aortic valve vegetation.   Pulmonic Valve: The pulmonic valve was normal in structure. Pulmonic valve  regurgitation is trivial.   Aorta: The aortic root is normal in size and structure.   IAS/Shunts: No atrial level shunt detected by color flow Doppler.     LEFT VENTRICLE  PLAX 2D  LVOT diam:     2.30 cm  LV SV:         56  LV SV Index:   28  LVOT Area:     4.15 cm     AORTIC VALVE  AV Area (Vmax):    1.23 cm  AV Area (Vmean):   1.35 cm  AV Area (VTI):     1.31 cm  AV Vmax:           292.00 cm/s  AV Vmean:          226.500 cm/s  AV VTI:            0.428 m  AV Peak Grad:      34.1 mmHg  AV Mean Grad:      22.5 mmHg  LVOT Vmax:         86.60 cm/s  LVOT Vmean:        73.400 cm/s  LVOT VTI:          0.135 m  LVOT/AV VTI ratio: 0.32   MITRAL VALVE  MV Area (PHT): 1.52 cm    SHUNTS  MV Area VTI:    0.65 cm    Systemic VTI:  0.14 m  MV Peak grad:  27.0 mmHg   Systemic Diam: 2.30 cm  MV Mean grad:  21.0 mmHg  MV Vmax:       2.60 m/s  MV Vmean:      220.0 cm/s   Maude Emmer MD  Electronically signed by Maude Emmer MD  Signature Date/Time: 05/20/2023/11:29:05 AM      Final     RIGHT HEART CATH AND CORONARY ANGIOGRAPHY     Hemodynamic findings consistent with severe pulmonary hypertension.   Normal coronary anatomy High PCWP 34/39 with mean 33 mm Hg Severe pulmonary HTN. PAP 76/36 mean 46 mm Hg Normal cardiac output 5.21 L/min, index 2.65.  Assessment & Plan: Valvular disease: Severe mitral valve stenosis and moderate aortic valve stenosis. S/P bioprosthetic MVR, AVR, and TV repair in 2018 at [facility]. The patient would benefit from surgical intervention but he would be very high risk for redo sternotomy due to cirrhosis, thrombocytopenia, uncontrolled DM and extensive valvular disease as well as other comorbidities. May not be a surgical candidate or may recommend the patient return to [facility] where his previous surgery was done. Dr. Maryjane to ultimately determine surgical candidacy. Hx of endocarditis: On Amoxicillin  HFpEF: Diuresing, AHF following and optimizing as able Thrombocytopenia: Up to 59,000 from 39,000 upon arrival DM: Uncontrolled, last A1C 12.3 Cirrhosis and alcohol use: Per patient 2-3 beers 3-4 times per week. No withdrawal. Will need GI follow up HTN: BP meds held due to hypotension HLD Pulmonary HTN  Con JAYSON Helm, PA-C 05/21/23  Agree with the above Pt is not a surgical candidate due to his redo operation required with thrombocytopenia. He may be evaluated for valve in valve mitral  replacement and would need structural heart evaluation for that. He should also be considered to be transferred to [facility] due to the very high complexity and risk of redo surgery

## 2023-05-22 ENCOUNTER — Encounter (HOSPITAL_COMMUNITY): Payer: Self-pay | Admitting: Cardiology

## 2023-05-22 ENCOUNTER — Other Ambulatory Visit (HOSPITAL_COMMUNITY): Payer: Self-pay

## 2023-05-22 DIAGNOSIS — D696 Thrombocytopenia, unspecified: Secondary | ICD-10-CM | POA: Diagnosis not present

## 2023-05-22 DIAGNOSIS — I050 Rheumatic mitral stenosis: Secondary | ICD-10-CM | POA: Diagnosis not present

## 2023-05-22 DIAGNOSIS — I5020 Unspecified systolic (congestive) heart failure: Secondary | ICD-10-CM | POA: Diagnosis not present

## 2023-05-22 LAB — BASIC METABOLIC PANEL
Anion gap: 9 (ref 5–15)
BUN: 14 mg/dL (ref 8–23)
CO2: 29 mmol/L (ref 22–32)
Calcium: 9.1 mg/dL (ref 8.9–10.3)
Chloride: 99 mmol/L (ref 98–111)
Creatinine, Ser: 0.77 mg/dL (ref 0.61–1.24)
GFR, Estimated: >60 mL/min (ref >60)
Glucose, Bld: 208 mg/dL — ABNORMAL HIGH (ref 70–99)
Potassium: 4.1 mmol/L (ref 3.5–5.1)
Sodium: 137 mmol/L (ref 135–145)

## 2023-05-22 LAB — GLUCOSE, CAPILLARY
Glucose-Capillary: 152 mg/dL — ABNORMAL HIGH (ref 70–99)
Glucose-Capillary: 231 mg/dL — ABNORMAL HIGH (ref 70–99)

## 2023-05-22 MED ORDER — ADULT MULTIVITAMIN W/MINERALS CH
1.0000 | ORAL_TABLET | Freq: Every day | ORAL | 0 refills | Status: AC
  Filled 2023-05-22: qty 30, 30d supply, fill #0

## 2023-05-22 MED ORDER — INSULIN SYRINGE 28G X 1/2" 0.5 ML MISC
1.0000 | Freq: Every day | 0 refills | Status: DC
  Filled 2023-05-22: qty 30, fill #0

## 2023-05-22 MED ORDER — LANCET DEVICE MISC
1.0000 | Freq: Three times a day (TID) | 0 refills | Status: AC
  Filled 2023-05-22: qty 1, 30d supply, fill #0

## 2023-05-22 MED ORDER — FOLIC ACID 1 MG PO TABS
1.0000 mg | ORAL_TABLET | Freq: Every day | ORAL | 0 refills | Status: AC
  Filled 2023-05-22: qty 30, 30d supply, fill #0

## 2023-05-22 MED ORDER — GLUCOSE BLOOD VI STRP
1.0000 | ORAL_STRIP | Freq: Three times a day (TID) | 0 refills | Status: AC
  Filled 2023-05-22: qty 100, 34d supply, fill #0

## 2023-05-22 MED ORDER — INSULIN GLARGINE 100 UNIT/ML SOLOSTAR PEN
15.0000 [IU] | PEN_INJECTOR | Freq: Every day | SUBCUTANEOUS | 11 refills | Status: AC
  Filled 2023-05-22: qty 6, 40d supply, fill #0

## 2023-05-22 MED ORDER — THIAMINE HCL 100 MG PO TABS
100.0000 mg | ORAL_TABLET | Freq: Every day | ORAL | 0 refills | Status: AC
  Filled 2023-05-22: qty 30, 30d supply, fill #0

## 2023-05-22 MED ORDER — INSULIN GLARGINE-YFGN 100 UNIT/ML ~~LOC~~ SOLN
15.0000 [IU] | Freq: Every day | SUBCUTANEOUS | 11 refills | Status: DC
  Filled 2023-05-22: qty 10, 66d supply, fill #0

## 2023-05-22 MED ORDER — FUROSEMIDE 40 MG PO TABS: 40.0000 mg | ORAL_TABLET | Freq: Two times a day (BID) | ORAL | Status: DC

## 2023-05-22 MED ORDER — INSULIN PEN NEEDLE 32G X 4 MM MISC
1.0000 | Freq: Three times a day (TID) | 0 refills | Status: AC
  Filled 2023-05-22: qty 100, 30d supply, fill #0

## 2023-05-22 MED ORDER — BLOOD GLUCOSE MONITOR SYSTEM W/DEVICE KIT
1.0000 | PACK | Freq: Three times a day (TID) | 0 refills | Status: AC
  Filled 2023-05-22: qty 1, 30d supply, fill #0

## 2023-05-22 MED ORDER — ONETOUCH DELICA PLUS LANCET30G MISC
1.0000 | Freq: Three times a day (TID) | 0 refills | Status: AC
  Filled 2023-05-22: qty 100, 30d supply, fill #0

## 2023-05-22 MED ORDER — FUROSEMIDE 40 MG PO TABS
40.0000 mg | ORAL_TABLET | Freq: Two times a day (BID) | ORAL | 2 refills | Status: AC
  Filled 2023-05-22: qty 60, 30d supply, fill #0

## 2023-05-22 MED ORDER — POTASSIUM CHLORIDE CRYS ER 10 MEQ PO TBCR: 20.0000 meq | EXTENDED_RELEASE_TABLET | Freq: Every day | ORAL | 0 refills | Status: AC

## 2023-05-22 NOTE — Progress Notes (Signed)
 Mobility Specialist Progress Note:   05/22/23 0900  Mobility  Activity Ambulated independently in hallway  Level of Assistance Independent  Assistive Device None  Distance Ambulated (ft) 360 ft  Activity Response Tolerated well  Mobility Referral Yes  Mobility visit 1 Mobility  Mobility Specialist Start Time (ACUTE ONLY) 0827  Mobility Specialist Stop Time (ACUTE ONLY) 0831  Mobility Specialist Time Calculation (min) (ACUTE ONLY) 4 min   Received pt in chair having no complaints and agreeable to mobility. Pt was asymptomatic throughout ambulation and returned to room w/o fault. Left in chair w/ call bell in reach and all needs met.   D'Vante Nicholaus Mobility Specialist Please contact via Special Educational Needs Teacher or Rehab office at [phone]

## 2023-05-22 NOTE — Progress Notes (Signed)
 Patient Name: Joseph Raymond Date of Encounter: 05/22/2023 [facility] Cardiologist: Cara JONETTA Lovelace, MD   Interval Summary  .    Feeling better.  Breathing improved.  Denies chest pain or shortness of breath.  Vital Signs .    Vitals:   05/21/23 1945 05/21/23 2347 05/22/23 0305 05/22/23 0620  BP: 108/67 119/78 121/83   Pulse: 71 78 83   Resp: 20 17 19    Temp: 98.7 F (37.1 C) 98 F (36.7 C) 98.3 F (36.8 C)   TempSrc: Oral Oral Oral   SpO2: 98% 95% 92%   Weight:    92.8 kg  Height:        Intake/Output Summary (Last 24 hours) at 05/22/2023 0830 Last data filed at 05/21/2023 2200 Gross per 24 hour  Intake 480 ml  Output --  Net 480 ml      05/22/2023    6:20 AM 05/21/2023    3:30 AM 05/20/2023    4:53 AM  Last 3 Weights  Weight (lbs) 204 lb 9.4 oz 204 lb 12.9 oz 202 lb 13.2 oz  Weight (kg) 92.8 kg 92.9 kg 92 kg      Telemetry/ECG    Sinus rhythm.  No events - Personally Reviewed  TEE 05/20/23: IMPRESSIONS     1. Left ventricular ejection fraction, by estimation, is 60 to 65%. The  left ventricle has normal function. There is mild left ventricular  hypertrophy.   2. Right ventricular systolic function is moderately reduced. The right  ventricular size is moderately enlarged.   3. Left atrial size was severely dilated. No left atrial/left atrial  appendage thrombus was detected.   4. Right atrial size was moderately dilated.   5. 29 mm Magna Mitral Ease stented bioprosthetic valve. Mild MR Leafelts  are severely thickened with restricted motion and severe MS Mean gradient  at HR 88 bpm 21 mmHg peak 27 mmHg MVA by VTI 0.7 cm2 . The mitral valve  has been repaired/replaced. Mild  mitral valve regurgitation. Severe mitral stenosis.   6. Prior TV ring 30 mm Carpentier Edwards Physio. The tricuspid valve is  has been repaired/replaced.   7. 25 mm Bovine stented Inspiris valve. Leaflets are thickened with  restricted motion mild AR mean gradient 22.5  mmHg peak 34.1 mmHg AVA 1.3  cm2 . The aortic valve has been repaired/replaced. Aortic valve  regurgitation is mild. Moderate aortic valve  stenosis.   LHC/RHC 05/21/23:   Hemodynamic findings consistent with severe pulmonary hypertension.   Normal coronary anatomy High PCWP 34/39 with mean 33 mm Hg Severe pulmonary HTN. PAP 76/36 mean 46 mm Hg Normal cardiac output 5.21 L/min, index 2.65.  RA 12, RV 68/3, PA 76/36, PA mean 46, PCWP 33.     Plan: CT surgery consultation   Physical Exam .    VS:  BP 121/83 (BP Location: Left Arm)   Pulse 83   Temp 98.3 F (36.8 C) (Oral)   Resp 19   Ht 5' 4 (1.626 m)   Wt 92.8 kg   SpO2 92%   BMI 35.12 kg/m  , BMI Body mass index is 35.12 kg/m. GENERAL:  Well appearing HEENT: Pupils equal round and reactive, fundi not visualized, oral mucosa unremarkable NECK:  No jugular venous distention, waveform within normal limits, carotid upstroke brisk and symmetric, no bruits, no thyromegaly LUNGS:  Clear to auscultation bilaterally HEART:  RRR.  PMI not displaced or sustained,S1 and S2 within normal limits, no S3, no S4,  no clicks, no rubs, III/IV diastolic murmur ABD:  Flat, positive bowel sounds normal in frequency in pitch, no bruits, no rebound, no guarding, no midline pulsatile mass, no hepatomegaly, no splenomegaly EXT:  2 plus pulses throughout, trace ankle edema, no cyanosis no clubbing SKIN:  No rashes no nodules NEURO:  Cranial nerves II through XII grossly intact, motor grossly intact throughout PSYCH:  Cognitively intact, oriented to person place and time   Assessment & Plan .     78M with HFpEF, severe LVH, prior endocarditis in 2018 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, mild RV dysfunction, s/p AVR with prosthetic aortic valve stenosis, thrombocytopenia, atrial fibrillation/flutter s/p ablation, and diabetes admitted with acute on chronic HFpEF and valvular heart failure.  # Acute HFpEF:  # Valvular  heart disease: Severe prosthetic mitral valve stenosis confirmed on TEE.  He underwent left heart cath yesterday and had normal coronary arteries.  He also had severely elevated pulmonary pressures.  This is likely due to WHO class II pulmonary hypertension in the setting of severe mitral valve stenosis.  Right atrial pressure was 12 mmHg.  Pulmonary capillary wedge pressure was elevated but LVEDP was not assessed.  I do not think we have much room for afterload reduction.  Will increase Lasix  to 40 mg p.o. twice daily.  He was evaluated by our CT surgery team and it is recommended that he be transferred back to [facility] for surgical management of his valvular heart disease given that this was the primary site of his surgery and his underlying medical comorbidities and kidney including cirrhosis and chronic thrombocytopenia.  He also has moderate stenosis of the prosthetic aortic valve.  They recommended he be considered for valve in valve mitral valve replacement.  If the surgery can be done fairly soon, it is very reasonable to go ahead and transfer him to [facility].  However if they are not able to do the surgery in the near future, it is reasonable to discharge him home with plans for close outpatient follow-up and surgery soon as possible.    # Hypertension:  Home antihypertensives has been held due to hypotension.  # DM: Uncontrolled.   # Thrombocytopenia:  Platelet count has been stable in the 50,000 range.  Suspected underlying cirrhoisis.    # ?Cirrhosis:  Underlying EtOH abuse.  No alcohol withdrawal symptoms while specialized.  Cirrhosis suspected on CT and seen on RUQ ultrasound.  No suggestion of varices on CT. he underwent TEE without any bleeding issues.  # Aortic atherosclerosis:  # Coronary Calcification:  # Hyperlipidemia:  LDL 113.  Will hold on statin for now given new diagnosis of cirrhosis.  Consider low dose statin and statin alternatives.  No aspirin  given thrombocytopenia and lack of  significant coronary artery disease.  LFTs have normalized.  # Enlarged pulmonary artery: Likely elevated pulmonary pressure in the setting of L sided heart disease.  He is euvolemic as above.    For questions or updates, please contact [facility] Please consult [email or web address] for contact info under        Signed, Annabella Scarce, MD

## 2023-05-22 NOTE — TOC Transition Note (Signed)
 Transition of Care [facility]) - Discharge Note   Patient Details  Name: Joseph Raymond MRN: [id number] Date of Birth: [birth year]  Transition of Care [facility] CM/SW Contact:  Roxie KANDICE Stain, RN Phone Number: 05/22/2023, 12:41 PM   Clinical Narrative:    Patient stable for discharge.  Patient will follow up with [facility] as outpatient.    Final next level of care: OP Rehab Barriers to Discharge: Barriers Resolved   Patient Goals and CMS Choice Patient states their goals for this hospitalization and ongoing recovery are:: patient will discharge and follow up with [facility]   Choice offered to / list presented to : Adult Children      Discharge Placement  home                     Discharge Plan and Services Additional resources added to the After Visit Summary for     Discharge Planning Services: CM Consult Post Acute Care Choice:  (outpatient therapy)          DME Arranged: N/A DME Agency: NA       HH Arranged: NA          Social Drivers of Health (SDOH) Interventions SDOH Screenings   Food Insecurity: No Food Insecurity (05/17/2023)  Housing: Low Risk  (05/17/2023)  Transportation Needs: No Transportation Needs (05/17/2023)  Utilities: Not At Risk (05/17/2023)  Tobacco Use: Low Risk  (05/16/2023)     Readmission Risk Interventions     No data to display

## 2023-05-22 NOTE — Discharge Instructions (Signed)
 [facility] te va a llamar para poner appointment para evaluation de cirugia del corazon.   Dr Artelia Laroche is the surgeon at [facility] that will arrange follow up. Phone Number [phone]

## 2023-05-22 NOTE — Discharge Summary (Signed)
 Physician Discharge Summary   Patient: Joseph Raymond MRN: [id number] DOB: 03-02-1961  Admit date:     05/16/2023  Discharge date: 05/22/23  Discharge Physician: Owen DELENA Lore   PCP: [facility]   Recommendations at discharge:    Close follow up with [facility] cardiothoracic fr evaluation of in valve or redo valve surgery. Appointment made for Wednesday 15.   Discharge Diagnoses: Principal Problem:   Heart failure with reduced ejection fraction (HCC) Active Problems:   Elevated troponin   Type II diabetes mellitus with renal manifestations (HCC)   Thrombocytopenia (HCC)   Cirrhosis (HCC)   History of heart valve replacement   History of endocarditis   Paroxysmal atrial flutter (HCC)   Alcohol use   Acute on chronic heart failure with preserved ejection fraction (HCC)   Severe mitral valve stenosis   Moderate aortic stenosis  Resolved Problems:   * No resolved hospital problems. *  Hospital Course: 63 year old with past medical history significant hypertension, aortic/tricuspid mitral valve replacement, endocarditis on suppressive antibiotics therapy, paroxysmal A-fib, GI bleed, diabetes type 2, chronic back pain, presents secondary to back pain and shortness of breath and was found to have evidence of acute heart failure.  Cardiology consulted patient started on IV Lasix .  Patient was found to have severe mitral valve stenosis and moderate aortic valve stenosis.  By TEE.  Underwent heart cath 1/8 show normal coronary anatomy high PCWP 34/39 severe pulmonary hypertension, normal cardiac output.  CVTS has been consulted for evaluation for surgery    Assessment and Plan:  1-Acute on chronic heart failure exacerbation with normal ejection fraction, diastolic dysfunction -Presented with worsening dyspnea, orthopnea, elevated BNP.  Chest x-ray with vascular congestion.  Moderate right and small left pleural effusion. -Treated with IV Lasix . -Likely in the  setting of severe mitral valve regurgitation and aortic valve stenosis -Underwent TEE and Heart  cath.  TEE showed severe mitral valve stenosis, moderate aortic valve stenosis.  Cath showed normal coronary arteries  discharge on lasix  40 mg BID   Elevation of troponin: -Setting of heart failure exacerbation and valve disease. -Heart cath normal coronary arteries   Diabetes type 2: Discharge on lantus .  Patient could also resume lantus .   Thrombocytopenia: -chronic Thrombocytopenia 120 K -Suspect related to underlying cirrhosis -Platelet count improving   Hyperphosphatemia: Resolved.    Possible cirrhosis: -Patient with history of underlying alcohol use, bilirubin 1.9 INR 1.1 cirrhosis suggested on right upper quadrant ultrasound. -Need to follow-up with GI as an outpatient   Chronic T12 compression deformity: -Pain Management. PT   Paroxysmal A-fib: -Not on anticoagulation secondary to history of GI bleed   History of aortic, tricuspid, mitral valve replacement History of endocarditis -Continue with chronic amoxicillin  -Severe mitral valve stenosis and moderate aortic stenosis by TEE. -CVTS consulted for further evaluation, not surgical candidate, could refer him back to [facility] for evaluation of redo sx on in valve replacement.  I contacted [facility], discussed case with Dr Noreene, CVTS, they are at capacity and patient is hemodynamically stable, she will help arrange out patient follow up for next week, Wednesday. Patient agree with this plan. Discussed with Dr Raford ok for discharge patient and follow up with CVTS at [facility].    GERD: -PPI   Alcohol use: Counseling.    Aortic atherosclerosis: LDL 113 Obesity class I: need life style modifications.          Consultants: Cardiology, CVTS Procedures performed: TEE, Cath Disposition: Home Diet recommendation:  Discharge  Diet Orders (From admission, onward)     Start     Ordered   05/22/23 0000  Diet - low sodium heart  healthy        05/22/23 1214           Carb modified diet DISCHARGE MEDICATION: Allergies as of 05/22/2023   No Known Allergies      Medication List     STOP taking these medications    ferrous sulfate  325 (65 FE) MG EC tablet   losartan  25 MG tablet Commonly known as: COZAAR    terbinafine 250 MG tablet Commonly known as: LAMISIL       TAKE these medications    amoxicillin  500 MG capsule Commonly known as: AMOXIL  Take 1 capsule (500 mg total) by mouth 2 (two) times daily.   Blood Glucose Monitor System w/Device Kit Use in the morning, at noon, and at bedtime.   folic acid  1 MG tablet Commonly known as: FOLVITE  Tome 1 tableta (1 mg en total) por va oral diariamente. (Take 1 tablet (1 mg total) by mouth daily.) Start taking on: May 23, 2023   furosemide  40 MG tablet Commonly known as: LASIX  Take 1 tablet (40 mg total) by mouth 2 (two) times daily. What changed:  medication strength how much to take when to take this   glucose blood test strip Use in the morning, at noon, and at bedtime.   insulin  glargine 100 UNIT/ML Solostar Pen Commonly known as: LANTUS  Inject 15 Units into the skin daily. May substitute as needed per insurance.   Lancet Device Misc 1 each by Does not apply route in the morning, at noon, and at bedtime. May substitute to any manufacturer covered by patient's insurance.   multivitamin with minerals Tabs tablet Tome 1 tableta por va oral diariamente. (Take 1 tablet by mouth daily.) Start taking on: May 23, 2023   OneTouch Delica Plus Lancet30G Misc Use in the morning, at noon, and at bedtime,  as directed   pantoprazole  40 MG tablet Commonly known as: PROTONIX  Take 1 tablet (40 mg total) by mouth 2 (two) times daily before a meal.   Pen Needles 31G X 5 MM Misc 1 each by Does not apply route 3 (three) times daily. May dispense any manufacturer covered by patient's insurance.   senna-docusate 8.6-50 MG  tablet Commonly known as: Senokot-S Take 1-2 tablets by mouth at bedtime as needed for moderate constipation or mild constipation.   thiamine  100 MG tablet Commonly known as: VITAMIN B1 Tome 1 tableta (100 mg en total) por va oral diariamente. (Take 1 tablet (100 mg total) by mouth daily.) Start taking on: May 23, 2023        Follow-up Information     [facility] Follow up.   Why: Someone will contact you with date and time for appointment.               Discharge Exam: Filed Weights   05/20/23 0453 05/21/23 0330 05/22/23 0620  Weight: 92 kg 92.9 kg 92.8 kg   General; NAD  Condition at discharge: stable  The results of significant diagnostics from this hospitalization (including imaging, microbiology, ancillary and laboratory) are listed below for reference.   Imaging Studies: CARDIAC CATHETERIZATION Result Date: 05/21/2023   Hemodynamic findings consistent with severe pulmonary hypertension. Normal coronary anatomy High PCWP 34/39 with mean 33 mm Hg Severe pulmonary HTN. PAP 76/36 mean 46 mm Hg Normal cardiac output 5.21 L/min, index 2.65. Plan: CT  surgery consultation   ECHO TEE Result Date: 05/20/2023    TRANSESOPHOGEAL ECHO REPORT   Patient Name:   DEEP BONAWITZ Date of Exam: 05/20/2023 Medical Rec #:  [id number]           Height:       64.0 in Accession #:    [id number]          Weight:       202.8 lb Date of Birth:  01/08/1961           BSA:          1.968 m Patient Age:    62 years            BP:           125/88 mmHg Patient Gender: M                   HR:           88 bpm. Exam Location:  Inpatient Procedure: Transesophageal Echo, Cardiac Doppler, Color Doppler and 3D Echo Indications:     Bioprosthetic Valve Abnormality  History:         Patient has prior history of Echocardiogram examinations, most                  recent 05/18/2023. Aortic Valve Disease and Mitral Valve Disease;                  Risk Factors:Hypertension and  Diabetes.  Sonographer:     Jayson Gaskins Referring Phys:  [id number] [facility] Diagnosing Phys: Maude Emmer MD PROCEDURE: After discussion of the risks and benefits of a TEE, an informed consent was obtained from the patient. The transesophogeal probe was passed without difficulty through the esophogus of the patient. Sedation performed by different physician. The patient was monitored while under deep sedation. Anesthestetic sedation was provided intravenously by Anesthesiology: 185mg  of Propofol , 100mg  of Lidocaine . The patient developed no complications during the procedure.  IMPRESSIONS  1. Left ventricular ejection fraction, by estimation, is 60 to 65%. The left ventricle has normal function. There is mild left ventricular hypertrophy.  2. Right ventricular systolic function is moderately reduced. The right ventricular size is moderately enlarged.  3. Left atrial size was severely dilated. No left atrial/left atrial appendage thrombus was detected.  4. Right atrial size was moderately dilated.  5. 29 mm Magna Mitral Ease stented bioprosthetic valve. Mild MR Leafelts are severely thickened with restricted motion and severe MS Mean gradient at HR 88 bpm 21 mmHg peak 27 mmHg MVA by VTI 0.7 cm2 . The mitral valve has been repaired/replaced. Mild mitral valve regurgitation. Severe mitral stenosis.  6. Prior TV ring 30 mm Carpentier Edwards Physio. The tricuspid valve is has been repaired/replaced.  7. 25 mm Bovine stented Inspiris valve. Leaflets are thickened with restricted motion mild AR mean gradient 22.5 mmHg peak 34.1 mmHg AVA 1.3 cm2 . The aortic valve has been repaired/replaced. Aortic valve regurgitation is mild. Moderate aortic valve stenosis. FINDINGS  Left Ventricle: Left ventricular ejection fraction, by estimation, is 60 to 65%. The left ventricle has normal function. The left ventricular internal cavity size was normal in size. There is mild left ventricular hypertrophy. Right Ventricle: The  right ventricular size is moderately enlarged. Right vetricular wall thickness was not assessed. Right ventricular systolic function is moderately reduced. Left Atrium: Left atrial size was severely dilated. No left atrial/left atrial appendage thrombus was detected. Right Atrium: Right atrial size was moderately  dilated. Pericardium: There is no evidence of pericardial effusion. Mitral Valve: 29 mm Magna Mitral Ease stented bioprosthetic valve. Mild MR Leafelts are severely thickened with restricted motion and severe MS Mean gradient at HR 88 bpm 21 mmHg peak 27 mmHg MVA by VTI 0.7 cm2. The mitral valve has been repaired/replaced. Mild mitral valve regurgitation. There is a 29 mm Magna Mitral Ease bovine pericardial present in the mitral position. Severe mitral valve stenosis. MV peak gradient, 27.0 mmHg. The mean mitral valve gradient is 21.0 mmHg. Tricuspid Valve: Prior TV ring 30 mm Carpentier Edwards Physio. The tricuspid valve is has been repaired/replaced. Tricuspid valve regurgitation is mild. Aortic Valve: 25 mm Bovine stented Inspiris valve. Leaflets are thickened with restricted motion mild AR mean gradient 22.5 mmHg peak 34.1 mmHg AVA 1.3 cm2. The aortic valve has been repaired/replaced. Aortic valve regurgitation is mild. Moderate aortic stenosis is present. Aortic valve mean gradient measures 22.5 mmHg. Aortic valve peak gradient measures 34.1 mmHg. Aortic valve area, by VTI measures 1.31 cm. There is a 25 mm Inspiris bovine pericardial valve valve present in the aortic position. There  is no evidence of aortic valve vegetation. Pulmonic Valve: The pulmonic valve was normal in structure. Pulmonic valve regurgitation is trivial. Aorta: The aortic root is normal in size and structure. IAS/Shunts: No atrial level shunt detected by color flow Doppler.  LEFT VENTRICLE PLAX 2D LVOT diam:     2.30 cm LV SV:         56 LV SV Index:   28 LVOT Area:     4.15 cm  AORTIC VALVE AV Area (Vmax):    1.23 cm AV Area  (Vmean):   1.35 cm AV Area (VTI):     1.31 cm AV Vmax:           292.00 cm/s AV Vmean:          226.500 cm/s AV VTI:            0.428 m AV Peak Grad:      34.1 mmHg AV Mean Grad:      22.5 mmHg LVOT Vmax:         86.60 cm/s LVOT Vmean:        73.400 cm/s LVOT VTI:          0.135 m LVOT/AV VTI ratio: 0.32 MITRAL VALVE MV Area (PHT): 1.52 cm    SHUNTS MV Area VTI:   0.65 cm    Systemic VTI:  0.14 m MV Peak grad:  27.0 mmHg   Systemic Diam: 2.30 cm MV Mean grad:  21.0 mmHg MV Vmax:       2.60 m/s MV Vmean:      220.0 cm/s Maude Emmer MD Electronically signed by Maude Emmer MD Signature Date/Time: 05/20/2023/11:29:05 AM    Final    ECHOCARDIOGRAM COMPLETE Result Date: 05/19/2023    ECHOCARDIOGRAM REPORT   Patient Name:   Samarth Ogle Date of Exam: 05/18/2023 Medical Rec #:  [id number]           Height:       64.0 in Accession #:    [id number]          Weight:       202.2 lb Date of Birth:  1960-07-28           BSA:          1.965 m Patient Age:    62 years            BP:  108/68 mmHg Patient Gender: M                   HR:           73 bpm. Exam Location:  Inpatient Procedure: 2D Echo, Cardiac Doppler and Color Doppler Indications:    MV stenosis  History:        Patient has prior history of Echocardiogram examinations, most                 recent 04/21/2023. Mitral Valve Disease and Endocarditis; Risk                 Factors:Diabetes. ETOH abuse.                 Aortic Valve: 25 mm Inspiris bovine pericardial valve is present                 in the aortic position.                 Mitral Valve: 29 mm Magna Mitral Ease bovine pericardial valve                 valve is present in the mitral position.  Sonographer:    Thea Norlander Referring Phys: Jayson Sierras MD IMPRESSIONS  1. Septal hypokinesis consistent with postoperative state. Left ventricular ejection fraction, by estimation, is 55 to 60%. The left ventricle has normal function. The left ventricle demonstrates regional wall motion  abnormalities (see scoring diagram/findings for description). There is moderate concentric left ventricular hypertrophy. Left ventricular diastolic function could not be evaluated. Elevated left atrial pressure.  2. Right ventricular systolic function is moderately reduced. The right ventricular size is normal.  3. Left atrial size was moderately dilated.  4. Right atrial size was mildly dilated.  5. The mitral valve has been repaired/replaced. No evidence of mitral valve regurgitation. Severe mitral stenosis. The mean mitral valve gradient is 16.0 mmHg with average heart rate of 53 bpm. There is a 29 mm Magna Mitral Ease bovine pericardial valve  present in the mitral position.  6. The tricuspid valve is has been repaired/replaced. The tricuspid valve is status post repair with an annuloplasty ring.  7. Prosthetic aortic valve leaflet motion is restricted. Valve area by planimetry is 0.97 cm^2. Dimensionless index 0.29 with a rounded jet contour, suggesting significant prosthetic valve stenosis. The aortic valve has been repaired/replaced. Aortic valve regurgitation is trivial. No aortic stenosis is present. There is a 25 mm Inspiris bovine pericardial valve present in the aortic position.  8. The inferior vena cava is normal in size with greater than 50% respiratory variability, suggesting right atrial pressure of 3 mmHg. FINDINGS  Left Ventricle: Septal hypokinesis consistent with postoperative state. Left ventricular ejection fraction, by estimation, is 55 to 60%. The left ventricle has normal function. The left ventricle demonstrates regional wall motion abnormalities. The left  ventricular internal cavity size was normal in size. There is moderate concentric left ventricular hypertrophy. Left ventricular diastolic function could not be evaluated due to mitral valve replacement. Left ventricular diastolic function could not be evaluated. Elevated left atrial pressure. Right Ventricle: The right ventricular size  is normal. No increase in right ventricular wall thickness. Right ventricular systolic function is moderately reduced. Left Atrium: Left atrial size was moderately dilated. Right Atrium: Right atrial size was mildly dilated. Pericardium: There is no evidence of pericardial effusion. Mitral Valve: The mitral valve has been repaired/replaced. No evidence of mitral valve regurgitation. There  is a 29 mm Magna Mitral Ease bovine pericardial valve present in the mitral position. Severe mitral valve stenosis. The mean mitral valve gradient  is 16.0 mmHg with average heart rate of 53 bpm. Tricuspid Valve: The tricuspid valve is has been repaired/replaced. Tricuspid valve regurgitation is not demonstrated. No evidence of tricuspid stenosis. The tricuspid valve is status post repair with an annuloplasty ring. Aortic Valve: Prosthetic aortic valve leaflet motion is restricted. Valve area by planimetry is 0.97 cm^2. Dimensionless index 0.29 with a rounded jet contour, suggesting significant prosthetic valve stenosis. The aortic valve has been repaired/replaced.  Aortic valve regurgitation is trivial. No aortic stenosis is present. Aortic valve mean gradient measures 27.0 mmHg. Aortic valve peak gradient measures 46.8 mmHg. There is a 25 mm Inspiris bovine pericardial valve present in the aortic position. Pulmonic Valve: The pulmonic valve was normal in structure. Pulmonic valve regurgitation is not visualized. No evidence of pulmonic stenosis. Aorta: The aortic root is normal in size and structure. Venous: The inferior vena cava is normal in size with greater than 50% respiratory variability, suggesting right atrial pressure of 3 mmHg. IAS/Shunts: No atrial level shunt detected by color flow Doppler.  LEFT VENTRICLE PLAX 2D LVIDd:         4.20 cm LVIDs:         2.90 cm LV PW:         1.51 cm LV IVS:        1.57 cm  LEFT ATRIUM           Index LA diam:      5.10 cm 2.59 cm/m LA Vol (A4C): 54.5 ml 27.73 ml/m  AORTIC VALVE AV  Vmax:           342.00 cm/s AV VTI:            0.577 m AV Peak Grad:      46.8 mmHg AV Mean Grad:      27.0 mmHg LVOT Vmax:         105.00 cm/s LVOT VTI:          0.160 m LVOT/AV VTI ratio: 0.28  AORTA Ao Root diam: 3.20 cm Ao Asc diam:  3.90 cm MITRAL VALVE MV Mean grad: 16.0 mmHg SHUNTS                         Systemic VTI: 0.16 m Annabella Scarce MD Electronically signed by Annabella Scarce MD Signature Date/Time: 05/19/2023/9:52:51 AM    Final    US  Abdomen Limited RUQ (LIVER/GB) Result Date: 05/18/2023 CLINICAL DATA:  Hyperbilirubinemia, abnormal CT EXAM: ULTRASOUND ABDOMEN LIMITED RIGHT UPPER QUADRANT COMPARISON:  CT 05/17/2023 FINDINGS: Gallbladder: No gallstones or wall thickening visualized. No sonographic Murphy sign noted by sonographer. Common bile duct: Diameter: 3 mm.  No intrahepatic biliary dilation. Liver: Nodular contour and increased parenchymal echogenicity compatible with cirrhosis portal vein is patent on color Doppler imaging with normal direction of blood flow towards the liver. Other: None. IMPRESSION: No evidence of acute cholecystitis.  No biliary dilation. Cirrhosis. Electronically Signed   By: Norman Gatlin M.D.   On: 05/18/2023 21:39   DG Thoracic Spine 2 View Result Date: 05/18/2023 CLINICAL DATA:  Spine pain EXAM: THORACIC SPINE 2 VIEWS COMPARISON:  Chest CT 04/21/2023 FINDINGS: Sternotomy and valve prostheses. Chronic T12 compression deformity. Other vertebra demonstrate normal stature. Moderate multilevel degenerative osteophytes. Alignment is within normal limits. IMPRESSION: Chronic T12 compression deformity. Moderate multilevel degenerative changes. Electronically Signed  By: Luke Bun M.D.   On: 05/18/2023 16:13   CT Renal Stone Study Result Date: 05/17/2023 CLINICAL DATA:  63 year old male with history of abdominal and flank pain. EXAM: CT ABDOMEN AND PELVIS WITHOUT CONTRAST TECHNIQUE: Multidetector CT imaging of the abdomen and pelvis was performed following the  standard protocol without IV contrast. RADIATION DOSE REDUCTION: This exam was performed according to the departmental dose-optimization program which includes automated exposure control, adjustment of the mA and/or kV according to patient size and/or use of iterative reconstruction technique. COMPARISON:  CT of the abdomen and pelvis 08/13/2020. FINDINGS: Lower chest: Moderate right and small left pleural effusions lie dependently. Areas of dependent atelectasis are noted in the lower lobes of the lungs bilaterally. Postoperative changes of tricuspid annuloplasty and mitral valve replacement (stented bioprosthesis) are noted. Hepatobiliary: Liver has a shrunken appearance and nodular contour, suggesting underlying cirrhosis. No discrete cystic or solid hepatic lesions are confidently identified on today's noncontrast CT examination. Unenhanced appearance of the gallbladder is unremarkable. Pancreas: No definite pancreatic mass or peripancreatic fluid collections or inflammatory changes are noted on today's noncontrast CT examination. Spleen: Unremarkable. Adrenals/Urinary Tract: There are no abnormal calcifications within the collecting system of either kidney, along the course of either ureter, or within the lumen of the urinary bladder. No hydroureteronephrosis or perinephric stranding to suggest urinary tract obstruction at this time. The unenhanced appearance of the kidneys is unremarkable bilaterally. Unenhanced appearance of the urinary bladder is normal. Bilateral adrenal glands are normal in appearance. Stomach/Bowel: Unenhanced appearance of the stomach is normal. There is no pathologic dilatation of small bowel or colon. Normal appendix. Vascular/Lymphatic: Atherosclerosis throughout the abdominal aorta and pelvic vasculature. No lymphadenopathy noted in the abdomen or pelvis. Reproductive: Prostate gland is mildly enlarged measuring 6.6 x 4.8 cm. Other: No significant volume of ascites.  No  pneumoperitoneum. Musculoskeletal: Chronic appearing compression fracture of T12 with 25% loss of anterior vertebral body height. Multilevel degenerative disc disease, most severe at L4-L5 where there is complete loss of intervertebral disc height and partial bony fusion. There are no aggressive appearing lytic or blastic lesions noted in the visualized portions of the skeleton. IMPRESSION: 1. No acute findings are noted in the abdomen or pelvis to account for the patient's symptoms. 2. Morphologic changes in the liver suggesting underlying cirrhosis. 3. Moderate right and small left pleural effusions lying dependently. 4. Prostatomegaly. 5. Aortic atherosclerosis. 6. Additional incidental findings, as above. Electronically Signed   By: Toribio Aye M.D.   On: 05/17/2023 06:13   DG Chest 2 View Result Date: 05/16/2023 CLINICAL DATA:  Shortness of breath EXAM: CHEST - 2 VIEW COMPARISON:  04/21/2019 FINDINGS: Cardiac shadow is stable. Postsurgical changes are again seen. Mild vascular congestion is noted. Small left pleural effusion is noted. No acute bony abnormality is seen. Chronic lower thoracic compression deformity is seen. IMPRESSION: Mild vascular congestion.  No other focal abnormality is noted. Electronically Signed   By: Oneil Devonshire M.D.   On: 05/16/2023 21:19    Microbiology: Results for orders placed or performed during the hospital encounter of 05/16/23  MRSA Next Gen by PCR, Nasal     Status: None   Collection Time: 05/17/23  3:18 PM   Specimen: Nasal Mucosa; Nasal Swab  Result Value Ref Range Status   MRSA by PCR Next Gen NOT DETECTED NOT DETECTED Final    Comment: (NOTE) The GeneXpert MRSA Assay (FDA approved for NASAL specimens only), is one component of a comprehensive MRSA colonization surveillance  program. It is not intended to diagnose MRSA infection nor to guide or monitor treatment for MRSA infections. Test performance is not FDA approved in patients less than 61  years old. Performed at [facility], [address] [place], [place] [address]     Labs: CBC: Recent Labs  Lab 05/18/23 [phone] 05/19/23 0213 05/20/23 0636 05/21/23 0231 05/21/23 0856 05/21/23 0900 05/21/23 1216  WBC 7.8 8.3 6.8 7.7  --   --  6.2  HGB 14.8 13.7 13.7 13.2 13.9 14.6  14.3 13.2  HCT 45.5 42.4 42.4 40.8 41.0 43.0  42.0 41.6  MCV 90.6 90.6 90.2 90.9  --   --  91.2  PLT 44* 51* 56* 59*  --   --  57*   Basic Metabolic Panel: Recent Labs  Lab 05/16/23 1932 05/17/23 0801 05/18/23 0206 05/19/23 0213 05/20/23 0233 05/21/23 0231 05/21/23 0856 05/21/23 0900 05/21/23 1216  NA 137  --  136 134* 136 135 140 138  136  --   K 4.2  --  3.5 3.7 3.7 4.4 3.4* 3.5  3.4*  --   CL 99  --  96* 96* 98 99  --   --   --   CO2 26  --  30 27 29 23   --   --   --   GLUCOSE 382*  --  303* 336* 294* 249*  --   --   --   BUN 11  --  15 22 18 20   --   --   --   CREATININE 0.68  --  0.73 0.90 0.69 0.67  --   --  0.64  CALCIUM  9.2  --  9.3 8.8* 8.7* 8.5*  --   --   --   MG  --  1.9 2.0  --   --   --   --   --   --   PHOS  --   --  5.3* 4.5  --   --   --   --   --    Liver Function Tests: Recent Labs  Lab 05/17/23 0801 05/21/23 1216  AST 50* 34  ALT 57* 44  ALKPHOS 67 56  BILITOT 1.9* 1.2  PROT 6.9 6.5  ALBUMIN 4.0 3.5   CBG: Recent Labs  Lab 05/21/23 1159 05/21/23 1658 05/21/23 2059 05/22/23 0610 05/22/23 1106  GLUCAP 272* 160* 157* 152* 231*    Discharge time spent: greater than 30 minutes.  Signed: Owen DELENA Lore, MD [facility] 05/22/2023

## 2023-05-23 LAB — LIPOPROTEIN A (LPA): Lipoprotein (a): <8.4 nmol/L (ref <75.0)

## 2023-05-23 MED FILL — Lidocaine HCl Local Preservative Free (PF) Inj 1%: INTRAMUSCULAR | Qty: 30 | Status: AC

## 2023-06-26 ENCOUNTER — Telehealth (HOSPITAL_COMMUNITY): Payer: Self-pay

## 2023-06-26 NOTE — Telephone Encounter (Signed)
Outside/paper referral received by Dr. Hessie Dibble from [facility]. Will fax over Physician order and request further documents. Insurance benefits and eligibility to be determined.

## 2023-06-26 NOTE — Telephone Encounter (Signed)
Called the interpreter line and spoke with Daleen Bo #[id number]. Per pt he would like to attend [facility] [place]. Will fax referral.
